# Patient Record
Sex: Male | Born: 1950 | Race: White | Hispanic: No | Smoking: Never smoker
Health system: Southern US, Community
[De-identification: ages and names within clinical notes are randomized; demographics above are authoritative.]

## PROBLEM LIST (undated history)

## (undated) DIAGNOSIS — C4490 Unspecified malignant neoplasm of skin, unspecified: Secondary | ICD-10-CM

## (undated) DIAGNOSIS — H409 Unspecified glaucoma: Secondary | ICD-10-CM

## (undated) DIAGNOSIS — M179 Osteoarthritis of knee, unspecified: Secondary | ICD-10-CM

## (undated) DIAGNOSIS — I10 Essential (primary) hypertension: Secondary | ICD-10-CM

## (undated) DIAGNOSIS — F329 Major depressive disorder, single episode, unspecified: Secondary | ICD-10-CM

## (undated) DIAGNOSIS — G4733 Obstructive sleep apnea (adult) (pediatric): Secondary | ICD-10-CM

## (undated) DIAGNOSIS — L309 Dermatitis, unspecified: Secondary | ICD-10-CM

## (undated) DIAGNOSIS — F32A Depression, unspecified: Secondary | ICD-10-CM

## (undated) DIAGNOSIS — I340 Nonrheumatic mitral (valve) insufficiency: Secondary | ICD-10-CM

## (undated) DIAGNOSIS — C61 Malignant neoplasm of prostate: Secondary | ICD-10-CM

## (undated) DIAGNOSIS — M1710 Unilateral primary osteoarthritis, unspecified knee: Secondary | ICD-10-CM

## (undated) HISTORY — PX: LITHOTRIPSY: SUR834

## (undated) HISTORY — PX: HERNIA REPAIR: SHX51

## (undated) HISTORY — PX: JOINT REPLACEMENT: SHX530

## (undated) HISTORY — PX: COLONOSCOPY: SHX174

## (undated) HISTORY — PX: CATARACT EXTRACTION W/ INTRAOCULAR LENS  IMPLANT, BILATERAL: SHX1307

## (undated) HISTORY — PX: TONSILLECTOMY: SUR1361

## (undated) HISTORY — PX: TUMOR EXCISION: SHX421

---

## 2003-06-07 ENCOUNTER — Ambulatory Visit (HOSPITAL_COMMUNITY): Admission: RE | Admit: 2003-06-07 | Discharge: 2003-06-07 | Payer: Self-pay | Admitting: Internal Medicine

## 2003-08-07 ENCOUNTER — Ambulatory Visit (HOSPITAL_COMMUNITY): Admission: RE | Admit: 2003-08-07 | Discharge: 2003-08-07 | Payer: Self-pay | Admitting: *Deleted

## 2005-04-05 ENCOUNTER — Emergency Department (HOSPITAL_COMMUNITY): Admission: EM | Admit: 2005-04-05 | Discharge: 2005-04-05 | Payer: Self-pay | Admitting: Emergency Medicine

## 2005-09-03 ENCOUNTER — Ambulatory Visit: Payer: Self-pay | Admitting: Pulmonary Disease

## 2005-09-11 ENCOUNTER — Ambulatory Visit (HOSPITAL_BASED_OUTPATIENT_CLINIC_OR_DEPARTMENT_OTHER): Admission: RE | Admit: 2005-09-11 | Discharge: 2005-09-11 | Payer: Self-pay | Admitting: Pulmonary Disease

## 2005-09-14 ENCOUNTER — Ambulatory Visit: Payer: Self-pay | Admitting: Pulmonary Disease

## 2005-09-28 ENCOUNTER — Ambulatory Visit: Payer: Self-pay | Admitting: Pulmonary Disease

## 2005-10-12 ENCOUNTER — Ambulatory Visit (HOSPITAL_BASED_OUTPATIENT_CLINIC_OR_DEPARTMENT_OTHER): Admission: RE | Admit: 2005-10-12 | Discharge: 2005-10-12 | Payer: Self-pay | Admitting: Pulmonary Disease

## 2005-10-21 ENCOUNTER — Ambulatory Visit: Payer: Self-pay | Admitting: Pulmonary Disease

## 2005-10-28 ENCOUNTER — Ambulatory Visit: Payer: Self-pay | Admitting: Pulmonary Disease

## 2005-11-17 ENCOUNTER — Ambulatory Visit: Payer: Self-pay | Admitting: Pulmonary Disease

## 2005-12-03 ENCOUNTER — Ambulatory Visit: Payer: Self-pay | Admitting: Pulmonary Disease

## 2006-01-13 ENCOUNTER — Ambulatory Visit: Payer: Self-pay | Admitting: Pulmonary Disease

## 2006-01-31 ENCOUNTER — Emergency Department (HOSPITAL_COMMUNITY): Admission: EM | Admit: 2006-01-31 | Discharge: 2006-01-31 | Payer: Self-pay | Admitting: Family Medicine

## 2013-08-25 DIAGNOSIS — C4490 Unspecified malignant neoplasm of skin, unspecified: Secondary | ICD-10-CM

## 2013-08-25 HISTORY — PX: SKIN CANCER EXCISION: SHX779

## 2013-08-25 HISTORY — DX: Unspecified malignant neoplasm of skin, unspecified: C44.90

## 2013-09-25 ENCOUNTER — Other Ambulatory Visit: Payer: Self-pay | Admitting: Orthopaedic Surgery

## 2013-09-25 DIAGNOSIS — M25562 Pain in left knee: Secondary | ICD-10-CM

## 2013-09-25 DIAGNOSIS — M1990 Unspecified osteoarthritis, unspecified site: Secondary | ICD-10-CM

## 2013-10-05 ENCOUNTER — Other Ambulatory Visit: Payer: Self-pay

## 2013-10-09 ENCOUNTER — Ambulatory Visit
Admission: RE | Admit: 2013-10-09 | Discharge: 2013-10-09 | Disposition: A | Discharge status: Home / Self Care | Payer: Medicare Other | Source: Ambulatory Visit | Attending: Orthopaedic Surgery | Admitting: Orthopaedic Surgery

## 2013-10-09 DIAGNOSIS — M1990 Unspecified osteoarthritis, unspecified site: Secondary | ICD-10-CM

## 2013-10-09 DIAGNOSIS — M25562 Pain in left knee: Secondary | ICD-10-CM

## 2013-11-15 ENCOUNTER — Other Ambulatory Visit (HOSPITAL_COMMUNITY): Payer: Self-pay | Admitting: Orthopaedic Surgery

## 2013-11-27 ENCOUNTER — Encounter (HOSPITAL_COMMUNITY): Payer: Self-pay | Admitting: Pharmacy Technician

## 2013-11-29 ENCOUNTER — Encounter (HOSPITAL_COMMUNITY): Payer: Self-pay

## 2013-11-29 ENCOUNTER — Encounter (HOSPITAL_COMMUNITY)
Admission: RE | Admit: 2013-11-29 | Discharge: 2013-11-29 | Disposition: A | Discharge status: Home / Self Care | Payer: Medicaid Other | Source: Ambulatory Visit | Attending: Orthopaedic Surgery | Admitting: Orthopaedic Surgery

## 2013-11-29 DIAGNOSIS — Z01818 Encounter for other preprocedural examination: Secondary | ICD-10-CM | POA: Insufficient documentation

## 2013-11-29 DIAGNOSIS — Z01812 Encounter for preprocedural laboratory examination: Secondary | ICD-10-CM | POA: Insufficient documentation

## 2013-11-29 HISTORY — DX: Depression, unspecified: F32.A

## 2013-11-29 HISTORY — DX: Dermatitis, unspecified: L30.9

## 2013-11-29 HISTORY — DX: Major depressive disorder, single episode, unspecified: F32.9

## 2013-11-29 HISTORY — DX: Unspecified glaucoma: H40.9

## 2013-11-29 HISTORY — DX: Unilateral primary osteoarthritis, unspecified knee: M17.10

## 2013-11-29 HISTORY — DX: Osteoarthritis of knee, unspecified: M17.9

## 2013-11-29 HISTORY — DX: Essential (primary) hypertension: I10

## 2013-11-29 HISTORY — DX: Nonrheumatic mitral (valve) insufficiency: I34.0

## 2013-11-29 LAB — TYPE AND SCREEN
ABO/RH(D): O POS
Antibody Screen: NEGATIVE

## 2013-11-29 LAB — COMPREHENSIVE METABOLIC PANEL
ALBUMIN: 4 g/dL (ref 3.5–5.2)
ALK PHOS: 98 U/L (ref 39–117)
ALT: 25 U/L (ref 0–53)
AST: 16 U/L (ref 0–37)
BILIRUBIN TOTAL: 0.5 mg/dL (ref 0.3–1.2)
BUN: 15 mg/dL (ref 6–23)
CHLORIDE: 102 meq/L (ref 96–112)
CO2: 28 mEq/L (ref 19–32)
Calcium: 9.4 mg/dL (ref 8.4–10.5)
Creatinine, Ser: 0.85 mg/dL (ref 0.50–1.35)
GFR calc Af Amer: >90 mL/min (ref >90)
GFR calc non Af Amer: >90 mL/min (ref >90)
Glucose, Bld: 109 mg/dL — ABNORMAL HIGH (ref 70–99)
POTASSIUM: 4.6 meq/L (ref 3.7–5.3)
Sodium: 143 mEq/L (ref 137–147)
Total Protein: 7.8 g/dL (ref 6.0–8.3)

## 2013-11-29 LAB — URINALYSIS, ROUTINE W REFLEX MICROSCOPIC
Bilirubin Urine: NEGATIVE
Glucose, UA: NEGATIVE mg/dL
Hgb urine dipstick: NEGATIVE
Ketones, ur: NEGATIVE mg/dL
LEUKOCYTES UA: NEGATIVE
NITRITE: NEGATIVE
PH: 5 (ref 5.0–8.0)
Protein, ur: NEGATIVE mg/dL
SPECIFIC GRAVITY, URINE: 1.014 (ref 1.005–1.030)
Urobilinogen, UA: 0.2 mg/dL (ref 0.0–1.0)

## 2013-11-29 LAB — SEDIMENTATION RATE: Sed Rate: 8 mm/hr (ref 0–16)

## 2013-11-29 LAB — C-REACTIVE PROTEIN: CRP: 0.6 mg/dL — ABNORMAL HIGH (ref <0.60)

## 2013-11-29 LAB — CBC WITH DIFFERENTIAL/PLATELET
BASOS ABS: 0.1 10*3/uL (ref 0.0–0.1)
Basophils Relative: 0 % (ref 0–1)
Eosinophils Absolute: 0.3 10*3/uL (ref 0.0–0.7)
Eosinophils Relative: 3 % (ref 0–5)
HCT: 45.5 % (ref 39.0–52.0)
HEMOGLOBIN: 15.5 g/dL (ref 13.0–17.0)
LYMPHS PCT: 22 % (ref 12–46)
Lymphs Abs: 2.5 10*3/uL (ref 0.7–4.0)
MCH: 30.6 pg (ref 26.0–34.0)
MCHC: 34.1 g/dL (ref 30.0–36.0)
MCV: 89.7 fL (ref 78.0–100.0)
MONOS PCT: 6 % (ref 3–12)
Monocytes Absolute: 0.7 10*3/uL (ref 0.1–1.0)
NEUTROS ABS: 7.8 10*3/uL — AB (ref 1.7–7.7)
Neutrophils Relative %: 69 % (ref 43–77)
Platelets: 280 10*3/uL (ref 150–400)
RBC: 5.07 MIL/uL (ref 4.22–5.81)
RDW: 13.5 % (ref 11.5–15.5)
WBC: 11.3 10*3/uL — AB (ref 4.0–10.5)

## 2013-11-29 LAB — SURGICAL PCR SCREEN
MRSA, PCR: NEGATIVE
Staphylococcus aureus: POSITIVE — AB

## 2013-11-29 LAB — PROTIME-INR
INR: 0.98 (ref 0.00–1.49)
Prothrombin Time: 12.8 seconds (ref 11.6–15.2)

## 2013-11-29 LAB — APTT: APTT: 33 s (ref 24–37)

## 2013-11-29 LAB — PREALBUMIN: Prealbumin: 25 mg/dL (ref 17.0–34.0)

## 2013-11-29 LAB — ABO/RH: ABO/RH(D): O POS

## 2013-11-29 NOTE — Pre-Procedure Instructions (Signed)
BENJAMINE STROUT  11/29/2013   Your procedure is scheduled on:  Wednesday, December 05, 2013 at 12:30 PM  Report to [place] [facility] (use Main Entrance "A'') at 10:30 AM.  Call this number if you have problems the morning of surgery: [phone]   Remember:   Do not eat food or drink liquids after midnight.   Take these medicines the morning of surgery with A SIP OF WATER: amLODipine (NORVASC) 10 MG tablet, carvedilol (COREG) 25 MG tablet, traMADol (ULTRAM) 50 MG tablet, Vilazodone HCl (VIIBRYD) 40 MG TABS Stop taking Aspirin vitamins and herbal medications. Do not take any NSAIDs ie: Ibuprofen, Advil, Naproxen or any medication containing Aspirin.   Do not wear jewelry, make-up or nail polish.  Do not wear lotions, powders, or perfumes. You may wear deodorant.  Do not shave 48 hours prior to surgery. Men may shave face and neck.  Do not bring valuables to the hospital.  [facility] is not responsible for any belongings or valuables.               Contacts, dentures or bridgework may not be worn into surgery.  Leave suitcase in the car. After surgery it may be brought to your room.  For patients admitted to the hospital, discharge time is determined by your treatment team.               Patients discharged the day of surgery will not be allowed to drive home.  Name and phone number of your driver:   Special Instructions:  Special Instructions:Special Instructions: [facility] - Preparing for Surgery  Before surgery, you can play an important role.  Because skin is not sterile, your skin needs to be as free of germs as possible.  You can reduce the number of germs on you skin by washing with CHG (chlorahexidine gluconate) soap before surgery.  CHG is an antiseptic cleaner which kills germs and bonds with the skin to continue killing germs even after washing.  Please DO NOT use if you have an allergy to CHG or antibacterial soaps.  If your skin becomes reddened/irritated stop using the  CHG and inform your nurse when you arrive at Short Stay.  Do not shave (including legs and underarms) for at least 48 hours prior to the first CHG shower.  You may shave your face.  Please follow these instructions carefully:   1.  Shower with CHG Soap the night before surgery and the morning of Surgery.  2.  If you choose to wash your hair, wash your hair first as usual with your normal shampoo.  3.  After you shampoo, rinse your hair and body thoroughly to remove the Shampoo.  4.  Use CHG as you would any other liquid soap.  You can apply chg directly  to the skin and wash gently with scrungie or a clean washcloth.  5.  Apply the CHG Soap to your body ONLY FROM THE NECK DOWN.  Do not use on open wounds or open sores.  Avoid contact with your eyes, ears, mouth and genitals (private parts).  Wash genitals (private parts) with your normal soap.  6.  Wash thoroughly, paying special attention to the area where your surgery will be performed.  7.  Thoroughly rinse your body with warm water from the neck down.  8.  DO NOT shower/wash with your normal soap after using and rinsing off the CHG Soap.  9.  Pat yourself dry with a clean towel.  10.  Wear clean pajamas.            11.  Place clean sheets on your bed the night of your first shower and do not sleep with pets.  Day of Surgery  Do not apply any lotions/deodorants the morning of surgery.  Please wear clean clothes to the hospital/surgery center.   Please read over the following fact sheets that you were given: Pain Booklet, Coughing and Deep Breathing, Blood Transfusion Information, Total Joint Packet, MRSA Information and Surgical Site Infection Prevention

## 2013-11-30 ENCOUNTER — Other Ambulatory Visit (HOSPITAL_COMMUNITY): Payer: Self-pay | Admitting: Orthopaedic Surgery

## 2013-11-30 LAB — URINE CULTURE
COLONY COUNT: NO GROWTH
Culture: NO GROWTH

## 2013-11-30 NOTE — Progress Notes (Addendum)
Anesthesia Chart Review:  Patient is a 63 year old male scheduled for left TKA on 12/05/13 by Dr. Erlinda Hong. Anesthesia type is posted for spinal.  History includes morbid obesity, HTN, OSA, "slight" mitral regurgitation, glaucoma, non-smoker, anxiety, depression, BCC of the back s/p excision '14, osteoarthritis, cataract extraction.  IM/Pulmonologist is Dr. Gwenevere Ghazi who medically cleared patient for this procedure; however, per PAT RN notes, he is getting a stress test today.  Additional records from [facility] are still pending.  EKG on 08/20/13 ([facility]) showed NSR.  Preoperative labs noted.  Urine culture is still pending.  Follow-up early next week regarding CXR, stress test, and other records.  [facility] Short Stay Center/Anesthesiology Phone [phone] 11/30/2013 3:32 PM  Addendum: 12/04/2013 11:10 AM Reviewed records from [facility].  He was seen on 11/30/13.  It appears echo (not stress), ABIs, and carotid duplex were done (see below).  Based on results, no further cardiac testing was recommended preoperatively by Dr. Lawson Radar.  By notes, recent carotid study showed only mild ICA stenosis/plaquing and LE ABI/duplex studies were normal.  Echo on 11/30/13 showed overall LV systolic function is normal, EF 32-44%, diastolic filling pattern indicates impaired relaxation, mildly dilated LA, normal RV size and function.     No CXR report received as of yet.  Urine culture showed no growth.

## 2013-12-04 ENCOUNTER — Other Ambulatory Visit (HOSPITAL_COMMUNITY): Payer: Self-pay | Admitting: Orthopaedic Surgery

## 2013-12-04 LAB — VITAMIN D 1,25 DIHYDROXY
Vitamin D 1, 25 (OH)2 Total: 39 pg/mL (ref 18–72)
Vitamin D2 1, 25 (OH)2: <8 pg/mL
Vitamin D3 1, 25 (OH)2: 39 pg/mL

## 2013-12-04 MED ORDER — DEXTROSE 5 % IV SOLN
3.0000 g | INTRAVENOUS | Status: AC
  Administered 2013-12-05: 3 g via INTRAVENOUS
  Filled 2013-12-04: qty 3000

## 2013-12-04 MED ORDER — TRANEXAMIC ACID 100 MG/ML IV SOLN
1000.0000 mg | INTRAVENOUS | Status: AC
  Administered 2013-12-05: 1000 mg via INTRAVENOUS
  Filled 2013-12-04: qty 10

## 2013-12-04 MED ORDER — VANCOMYCIN HCL 10 G IV SOLR
1500.0000 mg | INTRAVENOUS | Status: AC
  Administered 2013-12-05: 1500 mg via INTRAVENOUS
  Filled 2013-12-04: qty 1500

## 2013-12-04 MED ORDER — BUPIVACAINE LIPOSOME 1.3 % IJ SUSP
20.0000 mL | Freq: Once | INTRAMUSCULAR | Status: DC
  Filled 2013-12-04: qty 20

## 2013-12-04 NOTE — Progress Notes (Signed)
Spoke with [facility] and requested pts echocardiogram that was done on 11/30/13.  Office stated will fax now.

## 2013-12-05 ENCOUNTER — Inpatient Hospital Stay (HOSPITAL_COMMUNITY): Payer: Medicaid Other | Admitting: Anesthesiology

## 2013-12-05 ENCOUNTER — Encounter (HOSPITAL_COMMUNITY): Payer: Self-pay | Admitting: *Deleted

## 2013-12-05 ENCOUNTER — Encounter (HOSPITAL_COMMUNITY): Payer: Medicaid Other | Admitting: Vascular Surgery

## 2013-12-05 ENCOUNTER — Inpatient Hospital Stay (HOSPITAL_COMMUNITY): Payer: Medicaid Other

## 2013-12-05 ENCOUNTER — Encounter (HOSPITAL_COMMUNITY)
Admission: RE | Disposition: A | Discharge status: Rehabilitation Facility | Payer: Self-pay | Source: Ambulatory Visit | Attending: Orthopaedic Surgery

## 2013-12-05 ENCOUNTER — Inpatient Hospital Stay (HOSPITAL_COMMUNITY)
Admission: RE | Admit: 2013-12-05 | Discharge: 2013-12-11 | DRG: 470 | Disposition: A | Discharge status: Rehabilitation Facility | Payer: Medicaid Other | Source: Ambulatory Visit | Attending: Orthopaedic Surgery | Admitting: Orthopaedic Surgery

## 2013-12-05 DIAGNOSIS — Z96659 Presence of unspecified artificial knee joint: Secondary | ICD-10-CM

## 2013-12-05 DIAGNOSIS — F329 Major depressive disorder, single episode, unspecified: Secondary | ICD-10-CM | POA: Diagnosis present

## 2013-12-05 DIAGNOSIS — M545 Low back pain, unspecified: Secondary | ICD-10-CM | POA: Diagnosis present

## 2013-12-05 DIAGNOSIS — G4733 Obstructive sleep apnea (adult) (pediatric): Secondary | ICD-10-CM | POA: Diagnosis present

## 2013-12-05 DIAGNOSIS — I10 Essential (primary) hypertension: Secondary | ICD-10-CM | POA: Diagnosis present

## 2013-12-05 DIAGNOSIS — M1710 Unilateral primary osteoarthritis, unspecified knee: Principal | ICD-10-CM | POA: Diagnosis present

## 2013-12-05 DIAGNOSIS — G8929 Other chronic pain: Secondary | ICD-10-CM | POA: Diagnosis present

## 2013-12-05 DIAGNOSIS — M1712 Unilateral primary osteoarthritis, left knee: Secondary | ICD-10-CM | POA: Diagnosis present

## 2013-12-05 DIAGNOSIS — F410 Panic disorder [episodic paroxysmal anxiety] without agoraphobia: Secondary | ICD-10-CM | POA: Diagnosis present

## 2013-12-05 DIAGNOSIS — N390 Urinary tract infection, site not specified: Secondary | ICD-10-CM | POA: Diagnosis present

## 2013-12-05 DIAGNOSIS — G4700 Insomnia, unspecified: Secondary | ICD-10-CM | POA: Diagnosis present

## 2013-12-05 DIAGNOSIS — F3289 Other specified depressive episodes: Secondary | ICD-10-CM | POA: Diagnosis present

## 2013-12-05 HISTORY — PX: TOTAL KNEE ARTHROPLASTY: SHX125

## 2013-12-05 LAB — URINALYSIS, ROUTINE W REFLEX MICROSCOPIC
Glucose, UA: NEGATIVE mg/dL
HGB URINE DIPSTICK: NEGATIVE
Ketones, ur: 15 mg/dL — AB
Nitrite: POSITIVE — AB
PH: 6 (ref 5.0–8.0)
Protein, ur: NEGATIVE mg/dL
SPECIFIC GRAVITY, URINE: 1.029 (ref 1.005–1.030)
Urobilinogen, UA: 1 mg/dL (ref 0.0–1.0)

## 2013-12-05 LAB — URINE MICROSCOPIC-ADD ON

## 2013-12-05 SURGERY — ARTHROPLASTY, KNEE, TOTAL
Anesthesia: General | Laterality: Left

## 2013-12-05 MED ORDER — POLYETHYLENE GLYCOL 3350 17 G PO PACK
17.0000 g | PACK | Freq: Every day | ORAL | Status: DC | PRN
  Filled 2013-12-05: qty 1

## 2013-12-05 MED ORDER — TRAZODONE HCL 100 MG PO TABS
100.0000 mg | ORAL_TABLET | Freq: Every day | ORAL | Status: DC
  Administered 2013-12-05 – 2013-12-10 (×6): 100 mg via ORAL
  Filled 2013-12-05 (×7): qty 1

## 2013-12-05 MED ORDER — SUCCINYLCHOLINE CHLORIDE 20 MG/ML IJ SOLN
INTRAMUSCULAR | Status: DC | PRN
  Administered 2013-12-05: 160 mg via INTRAVENOUS

## 2013-12-05 MED ORDER — PHENYLEPHRINE HCL 10 MG/ML IJ SOLN
10.0000 mg | INTRAVENOUS | Status: DC | PRN
  Administered 2013-12-05: 50 ug/min via INTRAVENOUS

## 2013-12-05 MED ORDER — FENTANYL CITRATE 0.05 MG/ML IJ SOLN
INTRAMUSCULAR | Status: AC
  Filled 2013-12-05: qty 5

## 2013-12-05 MED ORDER — SODIUM CHLORIDE 0.9 % IV SOLN
INTRAVENOUS | Status: DC
  Administered 2013-12-06: 08:00:00 via INTRAVENOUS

## 2013-12-05 MED ORDER — LIDOCAINE HCL (CARDIAC) 20 MG/ML IV SOLN
INTRAVENOUS | Status: DC | PRN
  Administered 2013-12-05: 100 mg via INTRAVENOUS

## 2013-12-05 MED ORDER — KETOROLAC TROMETHAMINE 30 MG/ML IJ SOLN
30.0000 mg | Freq: Once | INTRAMUSCULAR | Status: AC
  Administered 2013-12-05: 30 mg via INTRAVENOUS

## 2013-12-05 MED ORDER — 0.9 % SODIUM CHLORIDE (POUR BTL) OPTIME
TOPICAL | Status: DC | PRN
  Administered 2013-12-05: 500 mL

## 2013-12-05 MED ORDER — KETOROLAC TROMETHAMINE 15 MG/ML IJ SOLN
30.0000 mg | Freq: Four times a day (QID) | INTRAMUSCULAR | Status: AC | PRN
  Administered 2013-12-06: 30 mg via INTRAVENOUS
  Filled 2013-12-05: qty 2

## 2013-12-05 MED ORDER — CHLORHEXIDINE GLUCONATE 4 % EX LIQD
60.0000 mL | Freq: Once | CUTANEOUS | Status: DC
  Filled 2013-12-05: qty 60

## 2013-12-05 MED ORDER — MIDAZOLAM HCL 2 MG/2ML IJ SOLN
INTRAMUSCULAR | Status: AC
  Filled 2013-12-05: qty 2

## 2013-12-05 MED ORDER — SENNA 8.6 MG PO TABS
1.0000 | ORAL_TABLET | Freq: Two times a day (BID) | ORAL | Status: DC
  Administered 2013-12-05 – 2013-12-09 (×9): 8.6 mg via ORAL
  Filled 2013-12-05 (×13): qty 1

## 2013-12-05 MED ORDER — SODIUM CHLORIDE 0.9 % IV SOLN
INTRAVENOUS | Status: DC | PRN
  Administered 2013-12-05: 13:00:00 via INTRAVENOUS

## 2013-12-05 MED ORDER — MENTHOL 3 MG MT LOZG: 1.0000 | LOZENGE | OROMUCOSAL | Status: DC | PRN

## 2013-12-05 MED ORDER — METHOCARBAMOL 100 MG/ML IJ SOLN: 500.0000 mg | Freq: Four times a day (QID) | INTRAVENOUS | Status: DC | PRN

## 2013-12-05 MED ORDER — AMLODIPINE BESYLATE 10 MG PO TABS
10.0000 mg | ORAL_TABLET | Freq: Every day | ORAL | Status: DC
  Administered 2013-12-06 – 2013-12-11 (×6): 10 mg via ORAL
  Filled 2013-12-05 (×7): qty 1

## 2013-12-05 MED ORDER — SORBITOL 70 % SOLN: 30.0000 mL | Freq: Every day | Status: DC | PRN

## 2013-12-05 MED ORDER — NEOSTIGMINE METHYLSULFATE 1 MG/ML IJ SOLN
INTRAMUSCULAR | Status: AC
  Filled 2013-12-05: qty 20

## 2013-12-05 MED ORDER — KETOROLAC TROMETHAMINE 30 MG/ML IJ SOLN
INTRAMUSCULAR | Status: AC
  Administered 2013-12-05: 30 mg via INTRAVENOUS
  Filled 2013-12-05: qty 1

## 2013-12-05 MED ORDER — ACETAMINOPHEN 500 MG PO TABS
1000.0000 mg | ORAL_TABLET | Freq: Four times a day (QID) | ORAL | Status: AC
  Administered 2013-12-05 – 2013-12-06 (×4): 1000 mg via ORAL
  Filled 2013-12-05 (×4): qty 2

## 2013-12-05 MED ORDER — PROPOFOL 10 MG/ML IV BOLUS
INTRAVENOUS | Status: AC
  Filled 2013-12-05: qty 20

## 2013-12-05 MED ORDER — METHOCARBAMOL 500 MG PO TABS
ORAL_TABLET | ORAL | Status: AC
  Filled 2013-12-05: qty 1

## 2013-12-05 MED ORDER — TRANEXAMIC ACID 100 MG/ML IV SOLN
1000.0000 mg | INTRAVENOUS | Status: AC
  Administered 2013-12-05: 1000 mg via INTRAVENOUS
  Filled 2013-12-05: qty 10

## 2013-12-05 MED ORDER — LIDOCAINE HCL (CARDIAC) 20 MG/ML IV SOLN
INTRAVENOUS | Status: AC
Start: 2013-12-05 — End: 2013-12-05
  Filled 2013-12-05: qty 5

## 2013-12-05 MED ORDER — ONDANSETRON HCL 4 MG/2ML IJ SOLN: 4.0000 mg | Freq: Four times a day (QID) | INTRAMUSCULAR | Status: DC | PRN

## 2013-12-05 MED ORDER — METOCLOPRAMIDE HCL 10 MG PO TABS: 5.0000 mg | ORAL_TABLET | Freq: Three times a day (TID) | ORAL | Status: DC | PRN

## 2013-12-05 MED ORDER — HYDROMORPHONE HCL PF 1 MG/ML IJ SOLN
INTRAMUSCULAR | Status: AC
  Administered 2013-12-05: 0.5 mg via INTRAVENOUS
  Filled 2013-12-05: qty 1

## 2013-12-05 MED ORDER — MORPHINE SULFATE 2 MG/ML IJ SOLN
2.0000 mg | INTRAMUSCULAR | Status: DC | PRN
  Administered 2013-12-05 – 2013-12-06 (×4): 2 mg via INTRAVENOUS
  Filled 2013-12-05 (×4): qty 1

## 2013-12-05 MED ORDER — SODIUM CHLORIDE 0.9 % IR SOLN
Status: DC | PRN
  Administered 2013-12-05: 3000 mL

## 2013-12-05 MED ORDER — CARVEDILOL 25 MG PO TABS
25.0000 mg | ORAL_TABLET | Freq: Two times a day (BID) | ORAL | Status: DC
  Administered 2013-12-06 – 2013-12-11 (×11): 25 mg via ORAL
  Filled 2013-12-05 (×13): qty 1

## 2013-12-05 MED ORDER — ACETAMINOPHEN 325 MG PO TABS: 650.0000 mg | ORAL_TABLET | Freq: Four times a day (QID) | ORAL | Status: DC | PRN

## 2013-12-05 MED ORDER — ONDANSETRON HCL 4 MG PO TABS
4.0000 mg | ORAL_TABLET | Freq: Four times a day (QID) | ORAL | Status: DC | PRN
  Administered 2013-12-09 – 2013-12-10 (×2): 4 mg via ORAL
  Filled 2013-12-05 (×2): qty 1

## 2013-12-05 MED ORDER — SODIUM CHLORIDE 0.9 % IJ SOLN
INTRAMUSCULAR | Status: AC
  Filled 2013-12-05: qty 10

## 2013-12-05 MED ORDER — DIPHENHYDRAMINE HCL 12.5 MG/5ML PO ELIX: 25.0000 mg | ORAL_SOLUTION | ORAL | Status: DC | PRN

## 2013-12-05 MED ORDER — METHOCARBAMOL 500 MG PO TABS
500.0000 mg | ORAL_TABLET | Freq: Four times a day (QID) | ORAL | Status: DC | PRN
  Administered 2013-12-05 – 2013-12-09 (×11): 500 mg via ORAL
  Filled 2013-12-05 (×11): qty 1

## 2013-12-05 MED ORDER — GLYCOPYRROLATE 0.2 MG/ML IJ SOLN
INTRAMUSCULAR | Status: AC
  Filled 2013-12-05: qty 2

## 2013-12-05 MED ORDER — ROCURONIUM BROMIDE 100 MG/10ML IV SOLN
INTRAVENOUS | Status: DC | PRN
  Administered 2013-12-05: 50 mg via INTRAVENOUS

## 2013-12-05 MED ORDER — FENTANYL CITRATE 0.05 MG/ML IJ SOLN
INTRAMUSCULAR | Status: DC | PRN
  Administered 2013-12-05: 50 ug via INTRAVENOUS
  Administered 2013-12-05: 25 ug via INTRAVENOUS
  Administered 2013-12-05: 50 ug via INTRAVENOUS
  Administered 2013-12-05: 100 ug via INTRAVENOUS
  Administered 2013-12-05: 25 ug via INTRAVENOUS
  Administered 2013-12-05: 100 ug via INTRAVENOUS

## 2013-12-05 MED ORDER — OXYCODONE HCL 5 MG PO TABS
ORAL_TABLET | ORAL | Status: AC
  Administered 2013-12-05: 5 mg via ORAL
  Filled 2013-12-05: qty 1

## 2013-12-05 MED ORDER — LIDOCAINE HCL (CARDIAC) 20 MG/ML IV SOLN
INTRAVENOUS | Status: AC
  Filled 2013-12-05: qty 5

## 2013-12-05 MED ORDER — LACTATED RINGERS IV SOLN
INTRAVENOUS | Status: DC
  Administered 2013-12-05: 11:00:00 via INTRAVENOUS

## 2013-12-05 MED ORDER — ASPIRIN EC 325 MG PO TBEC
325.0000 mg | DELAYED_RELEASE_TABLET | Freq: Two times a day (BID) | ORAL | Status: DC
  Administered 2013-12-05 – 2013-12-11 (×12): 325 mg via ORAL
  Filled 2013-12-05 (×14): qty 1

## 2013-12-05 MED ORDER — MIDAZOLAM HCL 5 MG/5ML IJ SOLN
INTRAMUSCULAR | Status: DC | PRN
  Administered 2013-12-05: 2 mg via INTRAVENOUS

## 2013-12-05 MED ORDER — VILAZODONE HCL 40 MG PO TABS
40.0000 mg | ORAL_TABLET | Freq: Every day | ORAL | Status: DC
  Administered 2013-12-06 – 2013-12-11 (×6): 40 mg via ORAL
  Filled 2013-12-05 (×7): qty 1

## 2013-12-05 MED ORDER — SODIUM CHLORIDE 0.9 % IJ SOLN
INTRAMUSCULAR | Status: DC | PRN
  Administered 2013-12-05: 15:00:00

## 2013-12-05 MED ORDER — METOCLOPRAMIDE HCL 5 MG/ML IJ SOLN
5.0000 mg | Freq: Three times a day (TID) | INTRAMUSCULAR | Status: DC | PRN
  Administered 2013-12-10: 10 mg via INTRAVENOUS
  Filled 2013-12-05: qty 2

## 2013-12-05 MED ORDER — VANCOMYCIN HCL 10 G IV SOLR
1250.0000 mg | Freq: Two times a day (BID) | INTRAVENOUS | Status: AC
  Administered 2013-12-05 – 2013-12-06 (×2): 1250 mg via INTRAVENOUS
  Filled 2013-12-05 (×2): qty 1250

## 2013-12-05 MED ORDER — LISINOPRIL 40 MG PO TABS
40.0000 mg | ORAL_TABLET | Freq: Every day | ORAL | Status: DC
  Administered 2013-12-06 – 2013-12-11 (×6): 40 mg via ORAL
  Filled 2013-12-05 (×7): qty 1

## 2013-12-05 MED ORDER — FENTANYL CITRATE 0.05 MG/ML IJ SOLN
INTRAMUSCULAR | Status: DC
Start: 2013-12-05 — End: 2013-12-05
  Filled 2013-12-05: qty 2

## 2013-12-05 MED ORDER — TRAMADOL HCL 50 MG PO TABS
50.0000 mg | ORAL_TABLET | Freq: Four times a day (QID) | ORAL | Status: DC
  Administered 2013-12-05 – 2013-12-11 (×24): 50 mg via ORAL
  Filled 2013-12-05 (×26): qty 1

## 2013-12-05 MED ORDER — FUROSEMIDE 40 MG PO TABS
40.0000 mg | ORAL_TABLET | Freq: Every day | ORAL | Status: DC
  Administered 2013-12-06 – 2013-12-11 (×6): 40 mg via ORAL
  Filled 2013-12-05 (×7): qty 1

## 2013-12-05 MED ORDER — 0.9 % SODIUM CHLORIDE (POUR BTL) OPTIME
TOPICAL | Status: DC | PRN
  Administered 2013-12-05: 1000 mL

## 2013-12-05 MED ORDER — PHENYLEPHRINE HCL 10 MG/ML IJ SOLN
INTRAMUSCULAR | Status: DC | PRN
  Administered 2013-12-05: 80 ug via INTRAVENOUS
  Administered 2013-12-05: 40 ug via INTRAVENOUS
  Administered 2013-12-05: 80 ug via INTRAVENOUS
  Administered 2013-12-05: 40 ug via INTRAVENOUS
  Administered 2013-12-05 (×2): 80 ug via INTRAVENOUS

## 2013-12-05 MED ORDER — PHENYLEPHRINE 40 MCG/ML (10ML) SYRINGE FOR IV PUSH (FOR BLOOD PRESSURE SUPPORT)
PREFILLED_SYRINGE | INTRAVENOUS | Status: AC
  Filled 2013-12-05: qty 10

## 2013-12-05 MED ORDER — EPHEDRINE SULFATE 50 MG/ML IJ SOLN
INTRAMUSCULAR | Status: AC
Start: 2013-12-05 — End: 2013-12-05
  Filled 2013-12-05: qty 1

## 2013-12-05 MED ORDER — ONDANSETRON HCL 4 MG/2ML IJ SOLN
INTRAMUSCULAR | Status: DC | PRN
  Administered 2013-12-05: 4 mg via INTRAVENOUS

## 2013-12-05 MED ORDER — GLYCOPYRROLATE 0.2 MG/ML IJ SOLN
INTRAMUSCULAR | Status: DC | PRN
  Administered 2013-12-05: 0.4 mg via INTRAVENOUS

## 2013-12-05 MED ORDER — OXYCODONE HCL 5 MG/5ML PO SOLN: 5.0000 mg | Freq: Once | ORAL | Status: AC | PRN

## 2013-12-05 MED ORDER — HYDROMORPHONE HCL PF 1 MG/ML IJ SOLN
0.2500 mg | INTRAMUSCULAR | Status: DC | PRN
  Administered 2013-12-05 (×2): 0.5 mg via INTRAVENOUS

## 2013-12-05 MED ORDER — MAGNESIUM CITRATE PO SOLN: 1.0000 | Freq: Once | ORAL | Status: AC | PRN

## 2013-12-05 MED ORDER — ONDANSETRON HCL 4 MG/2ML IJ SOLN
INTRAMUSCULAR | Status: AC
  Filled 2013-12-05: qty 2

## 2013-12-05 MED ORDER — OXYCODONE HCL 5 MG PO TABS
5.0000 mg | ORAL_TABLET | ORAL | Status: DC | PRN
  Administered 2013-12-06 – 2013-12-11 (×19): 10 mg via ORAL
  Filled 2013-12-05 (×20): qty 2

## 2013-12-05 MED ORDER — ALUM & MAG HYDROXIDE-SIMETH 200-200-20 MG/5ML PO SUSP: 30.0000 mL | ORAL | Status: DC | PRN

## 2013-12-05 MED ORDER — DEXTROSE 5 % IV SOLN
1.0000 g | INTRAVENOUS | Status: DC
  Administered 2013-12-05 – 2013-12-10 (×6): 1 g via INTRAVENOUS
  Filled 2013-12-05 (×7): qty 10

## 2013-12-05 MED ORDER — DEXTROSE 5 % IV SOLN
1.0000 g | Freq: Once | INTRAVENOUS | Status: AC
  Administered 2013-12-05: 1 g via INTRAVENOUS
  Filled 2013-12-05: qty 10

## 2013-12-05 MED ORDER — ROPIVACAINE HCL 5 MG/ML IJ SOLN
INTRAMUSCULAR | Status: DC | PRN
  Administered 2013-12-05: 20 mL via PERINEURAL

## 2013-12-05 MED ORDER — PROPOFOL 10 MG/ML IV BOLUS
INTRAVENOUS | Status: DC | PRN
  Administered 2013-12-05: 180 mg via INTRAVENOUS

## 2013-12-05 MED ORDER — LACTATED RINGERS IV SOLN
INTRAVENOUS | Status: DC | PRN
  Administered 2013-12-05 (×2): via INTRAVENOUS

## 2013-12-05 MED ORDER — OXYCODONE HCL ER 15 MG PO T12A
15.0000 mg | EXTENDED_RELEASE_TABLET | Freq: Two times a day (BID) | ORAL | Status: DC
  Administered 2013-12-05 – 2013-12-11 (×12): 15 mg via ORAL
  Filled 2013-12-05 (×12): qty 1

## 2013-12-05 MED ORDER — PHENOL 1.4 % MT LIQD: 1.0000 | OROMUCOSAL | Status: DC | PRN

## 2013-12-05 MED ORDER — NEOSTIGMINE METHYLSULFATE 1 MG/ML IJ SOLN
INTRAMUSCULAR | Status: DC | PRN
  Administered 2013-12-05: 3 mg via INTRAVENOUS

## 2013-12-05 MED ORDER — SUCCINYLCHOLINE CHLORIDE 20 MG/ML IJ SOLN
INTRAMUSCULAR | Status: AC
Start: 2013-12-05 — End: 2013-12-05
  Filled 2013-12-05: qty 1

## 2013-12-05 MED ORDER — OXYCODONE HCL 5 MG PO TABS
5.0000 mg | ORAL_TABLET | Freq: Once | ORAL | Status: AC | PRN
  Administered 2013-12-05: 5 mg via ORAL

## 2013-12-05 MED ORDER — ACETAMINOPHEN 650 MG RE SUPP: 650.0000 mg | Freq: Four times a day (QID) | RECTAL | Status: DC | PRN

## 2013-12-05 SURGICAL SUPPLY — 78 items
BANDAGE ELASTIC 6 VELCRO ST LF (GAUZE/BANDAGES/DRESSINGS) ×6 IMPLANT
BANDAGE ESMARK 6X9 LF (GAUZE/BANDAGES/DRESSINGS) ×1 IMPLANT
BLADE SAG 18X100X1.27 (BLADE) ×3 IMPLANT
BLADE SAW SGTL 13.0X1.19X90.0M (BLADE) ×3 IMPLANT
BNDG ESMARK 6X9 LF (GAUZE/BANDAGES/DRESSINGS) ×3
BOWL SMART MIX CTS (DISPOSABLE) ×3 IMPLANT
CAP KNEE TOTAL OXINIUM W/POLY ×3 IMPLANT
CEMENT BONE SIMPLEX SPEEDSET (Cement) ×6 IMPLANT
CLOSURE WOUND 1/2 X4 (GAUZE/BANDAGES/DRESSINGS) ×2
CLOTH BEACON ORANGE TIMEOUT ST (SAFETY) ×3 IMPLANT
COVER SURGICAL LIGHT HANDLE (MISCELLANEOUS) ×3 IMPLANT
CUFF TOURNIQUET SINGLE 34IN LL (TOURNIQUET CUFF) IMPLANT
CUFF TOURNIQUET SINGLE 44IN (TOURNIQUET CUFF) ×3 IMPLANT
DERMABOND ADVANCED (GAUZE/BANDAGES/DRESSINGS) ×2
DERMABOND ADVANCED .7 DNX12 (GAUZE/BANDAGES/DRESSINGS) ×1 IMPLANT
DRAPE EXTREMITY T 121X128X90 (DRAPE) ×3 IMPLANT
DRAPE INCISE IOBAN 66X45 STRL (DRAPES) ×3 IMPLANT
DRAPE ORTHO SPLIT 77X108 STRL (DRAPES) ×4
DRAPE POUCH INSTRU U-SHP 10X18 (DRAPES) ×3 IMPLANT
DRAPE PROXIMA HALF (DRAPES) ×3 IMPLANT
DRAPE SURG 17X11 SM STRL (DRAPES) ×6 IMPLANT
DRAPE SURG ORHT 6 SPLT 77X108 (DRAPES) ×2 IMPLANT
DRAPE U-SHAPE 47X51 STRL (DRAPES) ×3 IMPLANT
DRSG AQUACEL AG ADV 3.5X14 (GAUZE/BANDAGES/DRESSINGS) ×3 IMPLANT
DRSG PAD ABDOMINAL 8X10 ST (GAUZE/BANDAGES/DRESSINGS) ×3 IMPLANT
DRSG TEGADERM 4X4.75 (GAUZE/BANDAGES/DRESSINGS) ×3 IMPLANT
DURAPREP 26ML APPLICATOR (WOUND CARE) ×6 IMPLANT
ELECT REM PT RETURN 9FT ADLT (ELECTROSURGICAL) ×3
ELECTRODE REM PT RTRN 9FT ADLT (ELECTROSURGICAL) ×1 IMPLANT
EVACUATOR 1/8 PVC DRAIN (DRAIN) IMPLANT
FACESHIELD LNG OPTICON STERILE (SAFETY) ×9 IMPLANT
GAUZE SPONGE 2X2 8PLY STRL LF (GAUZE/BANDAGES/DRESSINGS) ×1 IMPLANT
GAUZE XEROFORM 5X9 LF (GAUZE/BANDAGES/DRESSINGS) ×3 IMPLANT
GLOVE BIO SURGEON STRL SZ8 (GLOVE) ×3 IMPLANT
GLOVE BIOGEL PI IND STRL 7.5 (GLOVE) ×1 IMPLANT
GLOVE BIOGEL PI INDICATOR 7.5 (GLOVE) ×2
GLOVE SURG SS PI 6.5 STRL IVOR (GLOVE) ×3 IMPLANT
GLOVE SURG SS PI 7.5 STRL IVOR (GLOVE) ×15 IMPLANT
GOWN PREVENTION PLUS LG XLONG (DISPOSABLE) IMPLANT
GOWN STRL REUS W/ TWL LRG LVL3 (GOWN DISPOSABLE) ×2 IMPLANT
GOWN STRL REUS W/ TWL XL LVL3 (GOWN DISPOSABLE) ×4 IMPLANT
GOWN STRL REUS W/TWL LRG LVL3 (GOWN DISPOSABLE) ×4
GOWN STRL REUS W/TWL XL LVL3 (GOWN DISPOSABLE) ×8
HANDPIECE INTERPULSE COAX TIP (DISPOSABLE) ×2
IMMOBILIZER KNEE 22 UNIV (SOFTGOODS) IMPLANT
KIT BASIN OR (CUSTOM PROCEDURE TRAY) ×3 IMPLANT
KIT ROOM TURNOVER OR (KITS) ×3 IMPLANT
MANIFOLD NEPTUNE II (INSTRUMENTS) ×3 IMPLANT
MARKER SKIN DUAL TIP RULER LAB (MISCELLANEOUS) ×3 IMPLANT
NEEDLE SPNL 18GX3.5 QUINCKE PK (NEEDLE) ×3 IMPLANT
NS IRRIG 1000ML POUR BTL (IV SOLUTION) ×3 IMPLANT
PACK TOTAL JOINT (CUSTOM PROCEDURE TRAY) ×3 IMPLANT
PAD ARMBOARD 7.5X6 YLW CONV (MISCELLANEOUS) ×6 IMPLANT
PADDING CAST COTTON 6X4 STRL (CAST SUPPLIES) ×3 IMPLANT
SET HNDPC FAN SPRY TIP SCT (DISPOSABLE) ×1 IMPLANT
SET PAD KNEE POSITIONER (MISCELLANEOUS) ×3 IMPLANT
SPONGE GAUZE 2X2 STER 10/PKG (GAUZE/BANDAGES/DRESSINGS) ×2
SPONGE GAUZE 4X4 12PLY (GAUZE/BANDAGES/DRESSINGS) ×3 IMPLANT
STAPLER VISISTAT 35W (STAPLE) IMPLANT
STRIP CLOSURE SKIN 1/2X4 (GAUZE/BANDAGES/DRESSINGS) ×4 IMPLANT
SUCTION FRAZIER TIP 10 FR DISP (SUCTIONS) IMPLANT
SUT ETHILON 3 0 FSL (SUTURE) ×6 IMPLANT
SUT MNCRL AB 3-0 PS2 18 (SUTURE) ×6 IMPLANT
SUT PDS AB 1 CTX 36 (SUTURE) ×3 IMPLANT
SUT VIC AB 0 CT1 27 (SUTURE) ×2
SUT VIC AB 0 CT1 27XBRD ANBCTR (SUTURE) ×1 IMPLANT
SUT VIC AB 1 CT1 27 (SUTURE) ×4
SUT VIC AB 1 CT1 27XBRD ANBCTR (SUTURE) ×2 IMPLANT
SUT VIC AB 1 CTX 27 (SUTURE) ×3 IMPLANT
SUT VIC AB 2-0 CT1 27 (SUTURE) ×12
SUT VIC AB 2-0 CT1 TAPERPNT 27 (SUTURE) ×6 IMPLANT
SUT VLOC 180 0 24IN GS25 (SUTURE) ×3 IMPLANT
SYR 50ML LL SCALE MARK (SYRINGE) ×6 IMPLANT
TOWEL OR 17X24 6PK STRL BLUE (TOWEL DISPOSABLE) ×3 IMPLANT
TOWEL OR 17X26 10 PK STRL BLUE (TOWEL DISPOSABLE) ×3 IMPLANT
TRAY FOLEY CATH 14FRSI W/METER (CATHETERS) ×3 IMPLANT
WATER STERILE IRR 1000ML POUR (IV SOLUTION) ×6 IMPLANT
WRAP KNEE MAXI GEL POST OP (GAUZE/BANDAGES/DRESSINGS) ×3 IMPLANT

## 2013-12-05 NOTE — Anesthesia Preprocedure Evaluation (Addendum)
Anesthesia Evaluation  Patient identified by MRN, date of birth, ID band Patient awake    Reviewed: Allergy & Precautions, H&P , NPO status , Patient's Chart, lab work & pertinent test results, reviewed documented beta blocker date and time   History of Anesthesia Complications Negative for: history of anesthetic complications  Airway Mallampati: III TM Distance: >3 FB Neck ROM: Full    Dental  (+) Poor Dentition, Dental Advisory Given   Pulmonary sleep apnea and Continuous Positive Airway Pressure Ventilation ,  breath sounds clear to auscultation        Cardiovascular hypertension, Pt. on medications and Pt. on home beta blockers Rhythm:Regular Rate:Normal     Neuro/Psych PSYCHIATRIC DISORDERS Anxiety Depression    GI/Hepatic negative GI ROS, Neg liver ROS,   Endo/Other  Morbid obesity  Renal/GU negative Renal ROS     Musculoskeletal   Abdominal   Peds  Hematology   Anesthesia Other Findings   Reproductive/Obstetrics negative OB ROS                          Anesthesia Physical Anesthesia Plan  ASA: III  Anesthesia Plan: General   Post-op Pain Management: MAC Combined w/ Regional for Post-op pain   Induction: Intravenous  Airway Management Planned: Oral ETT  Additional Equipment:   Intra-op Plan:   Post-operative Plan: Extubation in OR  Informed Consent: I have reviewed the patients History and Physical, chart, labs and discussed the procedure including the risks, benefits and alternatives for the proposed anesthesia with the patient or authorized representative who has indicated his/her understanding and acceptance.   Dental advisory given  Plan Discussed with: CRNA, Anesthesiologist and Surgeon  Anesthesia Plan Comments:         Anesthesia Quick Evaluation

## 2013-12-05 NOTE — Progress Notes (Signed)
Spoke with Dr. Erlinda Hong regarding antibiotics he confirmed he wanted both ancef and vancomycin with vancomycin to be given first.

## 2013-12-05 NOTE — Preoperative (Signed)
Beta Blockers   Reason not to administer Beta Blockers:Not Applicable 

## 2013-12-05 NOTE — Anesthesia Postprocedure Evaluation (Signed)
  Anesthesia Post-op Note  Patient: Jonathan Forbes  Procedure(s) Performed: Procedure(s): LEFT TOTAL KNEE ARTHROPLASTY (Left)  Patient Location: PACU  Anesthesia Type:General  Level of Consciousness: awake and alert   Airway and Oxygen Therapy: Patient Spontanous Breathing  Post-op Pain: moderate  Post-op Assessment: Post-op Vital signs reviewed, Patient's Cardiovascular Status Stable and Respiratory Function Stable  Post-op Vital Signs: Reviewed  Filed Vitals:   12/05/13 1715  BP: 122/73  Pulse: 75  Temp:   Resp: 12    Complications: No apparent anesthesia complications

## 2013-12-05 NOTE — Op Note (Signed)
Total Knee Arthroplasty Procedure Note [name] [id number] 12/05/2013   Preoperative diagnosis: Left knee osteoarthritis  Postoperative diagnosis:same  Operative procedure: Left total knee arthroplasty. CPT [phone]  Surgeon: N. Eduard Roux, MD  Co-surgeon: none  Assistants: none  Anesthesia: general, regional  Tourniquet time: 120 mins  Implants used: Smith [employer] Nephew Femur: Legion Size 6 Tibia:Genesis II size 6 Patella: 32 mm Polyethylene: 9 mm  Indication: Jonathan Forbes is a 63 y.o. year old male with a history of knee pain. Having failed conservative management, the patient elected to proceed with a total knee arthroplasty.  We have reviewed the risk and benefits of the surgery and they elected to proceed after voicing understanding.  Procedure:  After informed consent was obtained and understanding of the risk were voiced including but not limited to bleeding, infection, damage to surrounding structures including nerves and vessels, blood clots, leg length inequality and the failure to achieve desired results, the operative extremity was marked with verbal confirmation of the patient in the holding area.   The patient was then brought to the operating room and transported to the operating room table in the supine position.  A tourniquet was applied to the operative extremity around the upper thigh. The operative limb was then prepped and draped in the usual sterile fashion and preoperative antibiotics were administered.  A time out was performed prior to the start of surgery confirming the correct extremity, preoperative antibiotic administration, as well as team members, implants and instruments available for the case. Correct surgical site was also confirmed with preoperative radiographs. The limb was then elevated for exsanguination and the tourniquet was inflated. A midline incision was made and a standard medial parapatellar approach was performed. Posterior  cruciate ligament was sacrificed. Start site was drilled in the femur and the intramedullary distal femoral cutting guide was placed, set at 5 degrees valgus, taking 11 mm of distal resection. The distal cut was made. Osteophytes were then removed. Next, the proximal tibial cutting guide was placed with appropriate slope, varus/valgus alignment and depth of resection. The proximal tibial cut was made. Gap blocks were then used to assess the extension gap and alignment, and appropriate soft tissue releases were performed. Attention was turned back to the femur, which was sized using the sizing guide to a size 6. Appropriate rotation of the femoral component was determined using epicondylar axis, Whiteside's line, and assessing the flexion gap under ligament tension. The appropriate size 4-in-1 cutting block was placed and cuts were made. Posterior femoral osteophytes and uncapped bone were then removed with the curved osteotome. The tibia was sized for a size 6 component. The femoral box-cutting guide was placed and prepared for a PS femoral component. Trial components were placed, and stability was checked in full extension, mid-flexion, and deep flexion. Proper tibial rotation was determined and marked. The patella was then prepared for a size 32 mm patellar component, and tracking of the patella was evaluated and found to be tracking well without a lateral release. Trial components were then removed and tibial preparation performed. A posterior capsular injection comprising of 20 cc of 1.3% exparel and 40 cc of normal saline was performed for postoperative pain control. The bony surfaces were irrigated with a pulse lavage and then dried. Bone cement was vacuum mixed on the back table, and the final components sized above were cemented into place. After cement had finished curing, excess cement was removed. The stability of the construct was re-evaluated throughout a range  of motion and found to be acceptable.  The trial liner was removed, the knee was copiously irrigated, and the knee was re-evaluated for any excess bone debris. The real polyethylene liner, 9 mm thick, was inserted and checked to ensure the locking mechanism had engaged appropriately. The tourniquet was deflated and hemostasis was achieved. The wound was irrigated with dilute betadine in normal saline, and then again with normal saline. A drain was placed. Capsular closure was performed with a #1 vicryl and #1 PDS, subcutaneous fat closed with a 0 vicryl suture, then subcutaneous tissue closed with interrupted 2.0 vicryl suture. The skin was then closed with a 3.0 nylon. A sterile dressing was applied.   The patient was awakened in the operating room and taken to recovery in stable condition. All sponge, needle, and instrument counts were correct at the end of the case.  Position: supine  Complications: none.  Time Out: performed   Drains/Packing: 1 hemovac  Estimated blood loss: 50 cc  Returned to Recovery Room: in good condition.   Antibiotics: yes   Mechanical VTE (DVT) Prophylaxis: sequential compression devices, TED thigh-high  Chemical VTE (DVT) Prophylaxis: aspirin  Fluid Replacement  Crystalloid: see anesthesia record Blood: none  FFP: none   Specimens Removed: 1 to pathology   Sponge and Instrument Count Correct? yes   PACU: portable radiograph - knee AP and Lateral   Admission: inpatient status, start PT & OT POD#1  Plan/RTC: Return in 2 weeks for wound check.   Weight Bearing/Load Lower Extremity: full   N. Eduard Roux, MD [place] 4:24 PM

## 2013-12-05 NOTE — Progress Notes (Signed)
Orthopedic Tech Progress Note Patient Details:  Jonathan Forbes 10-19-1951 [id number]  CPM Left Knee CPM Left Knee: On Left Knee Flexion (Degrees): 90 Left Knee Extension (Degrees): 0 Additional Comments: put ohf on bed   [name] 12/05/2013, 7:04 PM

## 2013-12-05 NOTE — Transfer of Care (Signed)
Immediate Anesthesia Transfer of Care Note  Patient: Jonathan Forbes  Procedure(s) Performed: Procedure(s): LEFT TOTAL KNEE ARTHROPLASTY (Left)  Patient Location: PACU  Anesthesia Type:General  Level of Consciousness: awake, alert , oriented and patient cooperative  Airway & Oxygen Therapy: Patient Spontanous Breathing and Patient connected to face mask oxygen  Post-op Assessment: Report given to PACU RN and Post -op Vital signs reviewed and stable  Post vital signs: Reviewed  Complications: No apparent anesthesia complications

## 2013-12-05 NOTE — Anesthesia Procedure Notes (Signed)
Anesthesia Regional Block:  Femoral nerve block  Pre-Anesthetic Checklist: ,, timeout performed, Correct Patient, Correct Site, Correct Laterality, Correct Procedure, Correct Position, site marked, Risks and benefits discussed,  Surgical consent,  Pre-op evaluation,  At surgeon's request and post-op pain management  Laterality: Left and Lower  Prep: chloraprep       Needles:  Injection technique: Single-shot  Needle Type: Echogenic Stimulator Needle          Additional Needles:  Procedures: ultrasound guided (picture in chart) Femoral nerve block Narrative:  Start time: 12/05/2013 12:12 PM End time: 12/05/2013 12:19 PM Injection made incrementally with aspirations every 5 mL.  Performed by: Personally  Anesthesiologist: Ermalene Postin

## 2013-12-05 NOTE — H&P (Signed)
PREOPERATIVE H&P  Chief Complaint: left knee osteoarthritis  HPI: Jonathan Forbes is a 63 y.o. male who presents for surgical treatment of left knee osteoarthritis.  He denies any changes in medical history.  Past Medical History  Diagnosis Date  . Hypertension   . OA (osteoarthritis) of knee     Hx: of B/L knees  . Depression   . Cancer     Hx: of skin cancer  . Glaucoma     Hx; of beginning stages  . Eczema     Hx; of  . Anxiety   . Sleep apnea   . Mitral valve regurgitation     Hx; of slight   Past Surgical History  Procedure Laterality Date  . Tumor excision      skin cancer of right side of back  . Cataract extraction w/ intraocular lens  implant, bilateral      Hx; of  . Colonoscopy      Hx; of  . Tonsillectomy     History   Social History  . Marital Status: Divorced    Spouse Name: N/A    Number of Children: N/A  . Years of Education: N/A   Social History Main Topics  . Smoking status: Never Smoker   . Smokeless tobacco: Never Used  . Alcohol Use: Yes     Comment: rare  . Drug Use: No  . Sexual Activity: Not on file   Other Topics Concern  . Not on file   Social History Narrative  . No narrative on file   Family History  Problem Relation Age of Onset  . Heart disease Father   . Hypertension Father    No Known Allergies Prior to Admission medications   Medication Sig Start Date End Date Taking? Authorizing Provider  amLODipine (NORVASC) 10 MG tablet Take 10 mg by mouth daily.   Yes Historical Provider, MD  carvedilol (COREG) 25 MG tablet Take 25 mg by mouth 2 (two) times daily with a meal.   Yes Historical Provider, MD  furosemide (LASIX) 40 MG tablet Take 40 mg by mouth daily.   Yes Historical Provider, MD  lisinopril (PRINIVIL,ZESTRIL) 40 MG tablet Take 40 mg by mouth daily.   Yes Historical Provider, MD  traMADol (ULTRAM) 50 MG tablet Take 50 mg by mouth 4 (four) times daily.   Yes Historical Provider, MD  Vilazodone HCl (VIIBRYD) 40 MG  TABS Take 40 mg by mouth daily.   Yes Historical Provider, MD  traZODone (DESYREL) 100 MG tablet Take 100 mg by mouth at bedtime.    Historical Provider, MD     Positive ROS: All other systems have been reviewed and were otherwise negative with the exception of those mentioned in the HPI and as above.  Physical Exam: General: Alert, no acute distress Cardiovascular: No pedal edema Respiratory: No cyanosis, no use of accessory musculature GI: No organomegaly, abdomen is soft and non-tender Skin: No lesions in the area of chief complaint Neurologic: Sensation intact distally Psychiatric: Patient is competent for consent with normal mood and affect Lymphatic: No axillary or cervical lymphadenopathy  MUSCULOSKELETAL:  - exam stable  Assessment: left knee osteoarthritis  Plan: Plan for Procedure(s): LEFT TOTAL KNEE ARTHROPLASTY  The risks benefits and alternatives were discussed with the patient including but not limited to the risks of nonoperative treatment, versus surgical intervention including infection, bleeding, nerve injury,  blood clots, cardiopulmonary complications, morbidity, mortality, among others, and they were willing to proceed.   Erlinda Hong, Iya Hamed  Legrand Como, MD   12/05/2013 10:12 AM

## 2013-12-06 ENCOUNTER — Encounter (HOSPITAL_COMMUNITY): Payer: Self-pay | Admitting: Orthopaedic Surgery

## 2013-12-06 LAB — URINE CULTURE
COLONY COUNT: NO GROWTH
CULTURE: NO GROWTH

## 2013-12-06 LAB — CBC
HEMATOCRIT: 37.4 % — AB (ref 39.0–52.0)
Hemoglobin: 12.3 g/dL — ABNORMAL LOW (ref 13.0–17.0)
MCH: 29.6 pg (ref 26.0–34.0)
MCHC: 32.9 g/dL (ref 30.0–36.0)
MCV: 90.1 fL (ref 78.0–100.0)
Platelets: 224 10*3/uL (ref 150–400)
RBC: 4.15 MIL/uL — ABNORMAL LOW (ref 4.22–5.81)
RDW: 13.7 % (ref 11.5–15.5)
WBC: 11.9 10*3/uL — ABNORMAL HIGH (ref 4.0–10.5)

## 2013-12-06 LAB — BASIC METABOLIC PANEL
BUN: 19 mg/dL (ref 6–23)
CO2: 27 mEq/L (ref 19–32)
Calcium: 8.3 mg/dL — ABNORMAL LOW (ref 8.4–10.5)
Chloride: 99 mEq/L (ref 96–112)
Creatinine, Ser: 1.03 mg/dL (ref 0.50–1.35)
GFR calc Af Amer: 88 mL/min — ABNORMAL LOW (ref >90)
GFR, EST NON AFRICAN AMERICAN: 76 mL/min — AB (ref >90)
Glucose, Bld: 122 mg/dL — ABNORMAL HIGH (ref 70–99)
Potassium: 3.8 mEq/L (ref 3.7–5.3)
Sodium: 137 mEq/L (ref 137–147)

## 2013-12-06 MED ORDER — OXYCODONE HCL 5 MG PO TABS: 5.0000 mg | ORAL_TABLET | ORAL | Status: DC | PRN

## 2013-12-06 MED ORDER — ASPIRIN EC 325 MG PO TBEC: 325.0000 mg | DELAYED_RELEASE_TABLET | Freq: Two times a day (BID) | ORAL | Status: DC

## 2013-12-06 NOTE — Plan of Care (Signed)
Problem: Consults Goal: Diagnosis- Total Joint Replacement Primary Total Knee Left     

## 2013-12-06 NOTE — Discharge Instructions (Signed)
1. Remove dressing 12/13/13 and then place a gauze dressing with paper tape daily. 2. May shower after postoperative day 10.  Pat incision dry with towel. 3. Do not submerge incision. 4. Use foot roll when in bed. 5. Use CPM 6 hours a day.

## 2013-12-06 NOTE — Progress Notes (Signed)
Utilization review completed.  

## 2013-12-06 NOTE — Progress Notes (Signed)
Orthopedic Tech Progress Note Patient Details:  Jonathan Forbes 19-Jul-1951 [id number] Foot roll     Cammer, Theodoro Parma 12/06/2013, 8:51 AM

## 2013-12-06 NOTE — Care Management Note (Signed)
CARE MANAGEMENT NOTE 12/06/2013  Patient:  Jonathan Forbes, Jonathan Forbes   Account Number:  [id number]  Date Initiated:  12/06/2013  Documentation initiated by:  Ricki Miller  Subjective/Objective Assessment:   63 yr old male s/p left total knee arthroplasty.     Action/Plan:   Case Manager spoke with patient concerning home health needs at discharge. Patient states he lives alone and will need shortterm rehab. Social worker notified.   Anticipated DC Date:  12/07/2013   Anticipated DC Plan:  SKILLED NURSING FACILITY  In-house referral  Clinical Social Worker      DC Planning Services  CM consult      Choice offered to / List presented to:             Status of service:  In process, will continue to follow

## 2013-12-06 NOTE — Discharge Summary (Signed)
Physician Discharge Summary      Patient ID: Jonathan Forbes MRN: [id number] DOB/AGE: [birth year] 62 y.o.  Admit date: 12/05/2013 Discharge date: 12/11/2013  Admission Diagnoses:  Osteoarthritis of left knee  Discharge Diagnoses:  Principal Problem:   Osteoarthritis of left knee Active Problems:   Total knee replacement status   Past Medical History  Diagnosis Date  . Hypertension   . OA (osteoarthritis) of knee     Hx: of B/L knees  . Depression   . Cancer     Hx: of skin cancer  . Glaucoma     Hx; of beginning stages  . Eczema     Hx; of  . Anxiety   . Sleep apnea   . Mitral valve regurgitation     Hx; of slight    Surgeries: Procedure(s): LEFT TOTAL KNEE ARTHROPLASTY on 12/05/2013   Consultants (if any):    Discharged Condition: Improved  Hospital Course: Jonathan Forbes is an 63 y.o. male who was admitted 12/05/2013 with a diagnosis of Osteoarthritis of left knee and went to the operating room on 12/05/2013 and underwent the above named procedures.    He was given perioperative antibiotics:      Anti-infectives   Start     Dose/Rate Route Frequency Ordered Stop   12/05/13 1930  vancomycin (VANCOCIN) 1,250 mg in sodium chloride 0.9 % 250 mL IVPB     1,250 mg 166.7 mL/hr over 90 Minutes Intravenous Every 12 hours 12/05/13 1907 12/06/13 0921   12/05/13 1930  cefTRIAXone (ROCEPHIN) 1 g in dextrose 5 % 50 mL IVPB     1 g 100 mL/hr over 30 Minutes Intravenous Every 24 hours 12/05/13 1907 12/12/13 1929   12/05/13 1500  cefTRIAXone (ROCEPHIN) 1 g in dextrose 5 % 50 mL IVPB     1 g 100 mL/hr over 30 Minutes Intravenous  Once 12/05/13 1452 12/05/13 1455   12/05/13 0600  vancomycin (VANCOCIN) 1,500 mg in sodium chloride 0.9 % 500 mL IVPB     1,500 mg 250 mL/hr over 120 Minutes Intravenous On call to O.R. 12/04/13 1437 12/05/13 1300   12/05/13 0600  ceFAZolin (ANCEF) 3 g in dextrose 5 % 50 mL IVPB     3 g 160 mL/hr over 30 Minutes Intravenous On call to O.R.  12/04/13 1437 12/05/13 1327    .  He was given sequential compression devices, early ambulation, and aspirin for DVT prophylaxis.  He benefited maximally from the hospital stay and there were no complications.    Recent vital signs:  Filed Vitals:   12/11/13 1403  BP: 136/74  Pulse: 79  Temp: 98.3 F (36.8 C)  Resp: 16    Recent laboratory studies:  Lab Results  Component Value Date   HGB 11.3* 12/08/2013   HGB 11.4* 12/07/2013   HGB 12.3* 12/06/2013   Lab Results  Component Value Date   WBC 12.8* 12/08/2013   PLT 203 12/08/2013   Lab Results  Component Value Date   INR 0.98 11/29/2013   Lab Results  Component Value Date   NA 137 12/06/2013   K 3.8 12/06/2013   CL 99 12/06/2013   CO2 27 12/06/2013   BUN 19 12/06/2013   CREATININE 1.03 12/06/2013   GLUCOSE 122* 12/06/2013    Discharge Medications:     Medication List         amLODipine 10 MG tablet  Commonly known as:  NORVASC  Take 10 mg by mouth daily.  aspirin EC 325 MG tablet  Take 1 tablet (325 mg total) by mouth 2 (two) times daily.     aspirin EC 325 MG tablet  Take 1 tablet (325 mg total) by mouth 2 (two) times daily.     carvedilol 25 MG tablet  Commonly known as:  COREG  Take 25 mg by mouth 2 (two) times daily with a meal.     furosemide 40 MG tablet  Commonly known as:  LASIX  Take 40 mg by mouth daily.     lisinopril 40 MG tablet  Commonly known as:  PRINIVIL,ZESTRIL  Take 40 mg by mouth daily.     oxyCODONE 5 MG immediate release tablet  Commonly known as:  Oxy IR/ROXICODONE  Take 1-3 tablets (5-15 mg total) by mouth every 4 (four) hours as needed.     oxyCODONE 5 MG immediate release tablet  Commonly known as:  Oxy IR/ROXICODONE  Take 1-3 tablets (5-15 mg total) by mouth every 4 (four) hours as needed.     traMADol 50 MG tablet  Commonly known as:  ULTRAM  Take 50 mg by mouth 4 (four) times daily.     traZODone 100 MG tablet  Commonly known as:  DESYREL  Take 100 mg by mouth at  bedtime.     VIIBRYD 40 MG Tabs  Generic drug:  Vilazodone HCl  Take 40 mg by mouth daily.        Diagnostic Studies: Dg Chest 2 View  12/05/2013   CLINICAL DATA:  Left total knee arthroplasty. Hypertension. Sleep apnea. Mitral regurgitation.  EXAM: CHEST  2 VIEW  COMPARISON:  DG CHEST 2 VIEW dated 04/05/2005; XR-CHEST 1 VIEW dated 07/18/2012  FINDINGS: Mild cardiomegaly.  No edema.  The lungs appear clear.  Mild thoracic spondylosis.  IMPRESSION: 1. Mild cardiomegaly, without edema.   Electronically Signed   By: Sherryl Barters M.D.   On: 12/05/2013 10:51   Dg Knee Left Port  12/05/2013   CLINICAL DATA:  Left knee arthroplasty  EXAM: PORTABLE LEFT KNEE - 1-2 VIEW  COMPARISON:  None.  FINDINGS: The left knee demonstrates a total knee arthroplasty without evidence of hardware failure complication. There is no significant joint effusion. There is no fracture or dislocation. The alignment is anatomic. Surgical drains are present. Post-surgical changes noted in the surrounding soft tissues.  IMPRESSION: Left total knee arthroplasty.   Electronically Signed   By: Kathreen Devoid   On: 12/05/2013 17:14    Disposition: comprehensive inpatient rehab  Discharge Orders   Future Orders Complete By Expires   Call MD / Call 911  As directed    Comments:     If you experience chest pain or shortness of breath, CALL 911 and be transported to the hospital emergency room.  If you develope a fever above 101 F, pus (white drainage) or increased drainage or redness at the wound, or calf pain, call your surgeon's office.   Call MD / Call 911  As directed    Comments:     If you experience chest pain or shortness of breath, CALL 911 and be transported to the hospital emergency room.  If you develope a fever above 101.5 F, pus (white drainage) or increased drainage or redness at the wound, or calf pain, call your surgeon's office.   Constipation Prevention  As directed    Comments:     Drink plenty of fluids.   Prune juice may be helpful.  You may use a stool softener,  such as Colace (over the counter) 100 mg twice a day.  Use MiraLax (over the counter) for constipation as needed.   Constipation Prevention  As directed    Comments:     Drink plenty of fluids.  Prune juice may be helpful.  You may use a stool softener, such as Colace (over the counter) 100 mg twice a day.  Use MiraLax (over the counter) for constipation as needed.   Diet - low sodium heart healthy  As directed    Diet - low sodium heart healthy  As directed    Do not put a pillow under the knee. Place it under the heel.  As directed    Driving restrictions  As directed    Comments:     No driving while taking narcotic pain meds.   Increase activity slowly as tolerated  As directed    Increase activity slowly as tolerated  As directed    TED hose  As directed    Comments:     Use stockings (TED hose) for 6 weeks on both leg(s).  You may remove them at night for sleeping.   Weight bearing as tolerated  As directed    Questions:     Laterality:     Extremity:     Weight bearing as tolerated  As directed    Questions:     Laterality:     Extremity:        Follow-up Information   Follow up with Marianna Payment, MD In 2 weeks.   Specialty:  Orthopedic Surgery   Contact information:   [place] [address] [phone]        Signed: Marianna Payment 12/11/2013, 3:25 PM

## 2013-12-06 NOTE — Evaluation (Signed)
Physical Therapy Evaluation Patient Details Name: Jonathan Forbes MRN: [id number] DOB: Mar 09, 1951 Today's Date: 12/06/2013 Time: 0102-7253 PT Time Calculation (min): 30 min  PT Assessment / Plan / Recommendation History of Present Illness  L TKA  Clinical Impression  This patient underwent a left TKA and presents to PT with anticipated post-op decrease in strength and ROM, decreased functional mobility and gait.  Pt. Will benefit from acute PT to address these and below issues.  Pt. Lives alone and will need SNF level therapies before he is able to be home alone.     PT Assessment  Patient needs continued PT services    Follow Up Recommendations  SNF    Does the patient have the potential to tolerate intense rehabilitation      Barriers to Discharge Decreased caregiver support pt.lives alone    Equipment Recommendations  Rolling walker with 5" wheels;3in1 (PT)    Recommendations for Other Services     Frequency 7X/week    Precautions / Restrictions Precautions Precautions: Knee Precaution Booklet Issued: Yes (comment) Precaution Comments: reviewed knee precautions and provided handout for precautions and exercises Required Braces or Orthoses: Knee Immobilizer - Left Knee Immobilizer - Left: On when out of bed or walking Restrictions Weight Bearing Restrictions: Yes LLE Weight Bearing: Weight bearing as tolerated Other Position/Activity Restrictions: Pt. knew he is not to have anything under knee (pillow,etc)   Pertinent Vitals/Pain See vitals tab       Mobility  Bed Mobility Overal bed mobility:  (not assessed; pt. already in recliner) Transfers Overall transfer level: Needs assistance Equipment used: Rolling walker (2 wheeled) Transfers: Sit to/from Stand Sit to Stand: +2 physical assistance;Min assist General transfer comment: cues for LE  and hand placement , min assist of 2 to boost to stand Ambulation/Gait Ambulation/Gait assistance: Min assist;+2  safety/equipment Ambulation Distance (Feet): 50 Feet Assistive device: Rolling walker (2 wheeled) Gait Pattern/deviations: Step-to pattern;Decreased step length - right;Decreased step length - left;Trunk flexed General Gait Details: cues for upright posture and for encouraging heel strike to activate quads    Exercises Total Joint Exercises Ankle Circles/Pumps: AROM;Both;10 reps Quad Sets: AROM;Both;10 reps Short Arc Quad: AROM;Left;10 reps Knee Flexion: AROM;Left;5 reps;Seated Goniometric ROM: ~ 0 to 80   PT Diagnosis: Difficulty walking;Generalized weakness;Acute pain (weakness L LE)  PT Problem List: Decreased strength;Decreased range of motion;Decreased activity tolerance;Decreased balance;Decreased mobility;Decreased knowledge of use of DME;Decreased knowledge of precautions;Obesity;Pain PT Treatment Interventions: DME instruction;Gait training;Functional mobility training;Therapeutic activities;Therapeutic exercise;Balance training;Patient/family education     PT Goals(Current goals can be found in the care plan section) Acute Rehab PT Goals Patient Stated Goal: rehab then home independently PT Goal Formulation: With patient Time For Goal Achievement: 12/13/13 Potential to Achieve Goals: Good  Visit Information  Last PT Received On: 12/06/13 Assistance Needed: +2 (2 for equipment and safety due to body habitus) History of Present Illness: L TKA       Prior [place] expects to be discharged to:: Skilled nursing facility Living Arrangements: Alone Prior Function Level of Independence: Independent Communication Communication: No difficulties    Cognition  Cognition Arousal/Alertness: Awake/alert Behavior During Therapy: WFL for tasks assessed/performed Overall Cognitive Status: Within Functional Limits for tasks assessed    Extremity/Trunk Assessment Upper Extremity Assessment Upper Extremity Assessment: Overall WFL for tasks  assessed Lower Extremity Assessment Lower Extremity Assessment: LLE deficits/detail LLE Deficits / Details: good quad set and ankle pump; SAQ active through -10 range Cervical / Trunk Assessment Cervical / Trunk Assessment:  Normal   Balance Balance Overall balance assessment: Needs assistance Standing balance support: Bilateral upper extremity supported;During functional activity Standing balance-Leahy Scale: Fair  End of Session PT - End of Session Equipment Utilized During Treatment: Gait belt;Left knee immobilizer Activity Tolerance: Patient tolerated treatment well Patient left: in chair;with call bell/phone within reach Nurse Communication: Mobility status CPM Left Knee Additional Comments: foot roll  GP     [name] 12/06/2013, 11:02 AM [name] PT Acute Rehab Services [phone] [place] [phone]

## 2013-12-06 NOTE — Progress Notes (Signed)
   Subjective:  Patient reports pain as moderate.  No events  Objective:   VITALS:   Filed Vitals:   12/05/13 2200 12/06/13 0000 12/06/13 0400 12/06/13 0634  BP: 158/97   152/92  Pulse: 97   95  Temp: 99.1 F (37.3 C)   98.9 F (37.2 C)  TempSrc:      Resp: 16 18 18 18   SpO2: 94% 97% 98% 95%    Neurologically intact Neurovascular intact Sensation intact distally Intact pulses distally Dorsiflexion/Plantar flexion intact Incision: dressing C/D/I and no drainage No cellulitis present Compartment soft   Lab Results  Component Value Date   WBC 11.9* 12/06/2013   HGB 12.3* 12/06/2013   HCT 37.4* 12/06/2013   MCV 90.1 12/06/2013   PLT 224 12/06/2013     Assessment/Plan: 1 Day Post-Op   Problem List Items Addressed This Visit   None      Expected postop acute blood loss anemia - will monitor for symptoms CPM 0-90, advance 10 degrees each day for 6 hrs a day Knee extension protocol when not in CPM Up with PT/OT DVT ppx - SCDs, ambulation, asa WBAT left lower extremity Rocephin for UTI Foley d/c'ed  HVAC d/c'ed  Pain control Discharge planning   Marianna Payment 12/06/2013, 8:22 AM [phone]

## 2013-12-07 LAB — CBC
HCT: 34.3 % — ABNORMAL LOW (ref 39.0–52.0)
HEMOGLOBIN: 11.4 g/dL — AB (ref 13.0–17.0)
MCH: 30.4 pg (ref 26.0–34.0)
MCHC: 33.2 g/dL (ref 30.0–36.0)
MCV: 91.5 fL (ref 78.0–100.0)
Platelets: 191 10*3/uL (ref 150–400)
RBC: 3.75 MIL/uL — ABNORMAL LOW (ref 4.22–5.81)
RDW: 13.9 % (ref 11.5–15.5)
WBC: 13.2 10*3/uL — AB (ref 4.0–10.5)

## 2013-12-07 MED ORDER — ASPIRIN EC 325 MG PO TBEC: 325.0000 mg | DELAYED_RELEASE_TABLET | Freq: Two times a day (BID) | ORAL | Status: DC

## 2013-12-07 MED ORDER — OXYCODONE HCL 5 MG PO TABS: 5.0000 mg | ORAL_TABLET | ORAL | Status: DC | PRN

## 2013-12-07 NOTE — Progress Notes (Signed)
Patient needs CPM x 6 weeks  N. Eduard Roux, MD [facility] [phone] 5:56 PM

## 2013-12-07 NOTE — Progress Notes (Signed)
Physical Therapy Treatment Patient Details Name: Jonathan Forbes MRN: [id number] DOB: September 02, 1951 Today's Date: 12/07/2013 Time: 5277-8242 PT Time Calculation (min): 23 min  PT Assessment / Plan / Recommendation  History of Present Illness L TKA   PT Comments   Pt. In bed complaining of spasms in back with noted numbness around R upper thigh/hip.  RN Jonathan Forbes made aware.  Session focused on exercises since he was recently assisted back in bed and didn't want to get up right away.  SW informs this therapist that since pt. Has medicaid, he will likely not get therapies at the SNF he will go to due to Jonathan Forbes regs.  Need to reinforce exercises as much as possible while he is still in house.    Follow Up Recommendations  SNF     Does the patient have the potential to tolerate intense rehabilitation     Barriers to Discharge        Equipment Recommendations  Rolling walker with 5" wheels;3in1 (PT)    Recommendations for Other Services    Frequency 7X/week   Progress towards PT Goals Progress towards PT goals: Progressing toward goals  Plan Current plan remains appropriate    Precautions / Restrictions Precautions Precautions: Knee Required Braces or Orthoses: Knee Immobilizer - Left Knee Immobilizer - Left: On when out of bed or walking Restrictions Weight Bearing Restrictions: Yes LLE Weight Bearing: Weight bearing as tolerated   Pertinent Vitals/Pain See vitals tab Pain is somewhat  Of a limiting factor in his progress this pm.      Mobility  Bed Mobility Overal bed mobility: Needs Assistance;+2 for physical assistance (not assessed, up in recliner) Bed Mobility: Supine to Sit Supine to sit: Mod assist;+2 for physical assistance General bed mobility comments: pt. able to initiate moving L LE to edge of bed but needed 2 assist to move to sit at EOB Transfers Overall transfer level: Needs assistance Equipment used: Rolling walker (2 wheeled) Transfers: Sit to/from Stand Sit to  Stand: +2 physical assistance;Min assist General transfer comment: raised height of bed, pt. needed assist to power up to stand; increased difficulty with this task due to body habitus Ambulation/Gait Ambulation/Gait assistance: Min assist;+2 safety/equipment Ambulation Distance (Feet): 65 Feet Assistive device: Rolling walker (2 wheeled) Gait Pattern/deviations: Step-to pattern Gait velocity: decreased General Gait Details: vc for heel strike, quad activation and erect stance    Exercises Total Joint Exercises Ankle Circles/Pumps: AROM;Both;10 reps Quad Sets: AROM;Both;10 reps Short Arc Quad: Left;10 reps;AAROM Heel Slides: AAROM;10 reps;Left Hip ABduction/ADduction: AAROM;Left;10 reps Straight Leg Raises: AAROM;Left;10 reps Knee Flexion: AROM;Left;5 reps;Seated Goniometric ROM: 0 to 85   PT Diagnosis:    PT Problem List:   PT Treatment Interventions:     PT Goals (current goals can now be found in the care plan section)    Visit Information  Last PT Received On: 12/07/13 Assistance Needed: +2 (1 for in bed exercises) History of Present Illness: L TKA    Subjective Data  Subjective: Pt. frequently inquires to see if what he is experiencing is "normal", (level of weakness, of pain, etc)   Cognition  Cognition Arousal/Alertness: Awake/alert Behavior During Therapy: WFL for tasks assessed/performed Overall Cognitive Status: Within Functional Limits for tasks assessed    Balance     End of Session PT - End of Session Equipment Utilized During Treatment: Gait belt;Left knee immobilizer Activity Tolerance: Patient limited by pain Patient left: in bed Nurse Communication: Mobility status   GP     Jonathan Forbes,  Jonathan Forbes 12/07/2013, 3:07 PM Jonathan Forbes PT Acute Rehab Services [phone] [place] [phone]

## 2013-12-07 NOTE — Clinical Social Work Placement (Signed)
Clinical Social Work Department  CLINICAL SOCIAL WORK PLACEMENT NOTE   Patient: Jonathan Forbes Account Number: [id number]  Admit date: 12/05/13 Clinical Social Worker: Rhea Pink LCSWA Date/time: 12/07/2013 11:30 AM  Clinical Social Work is seeking post-discharge placement for this patient at the following level of care: SKILLED NURSING (*CSW will update this form in Epic as items are completed)  12/07/2013 Patient/family provided with [place] Department of Clinical Social Work's list of facilities offering this level of care within the geographic area requested by the patient (or if unable, by the patient's family).  2/13/2015Patient/family informed of their freedom to choose among providers that offer the needed level of care, that participate in Medicare, Medicaid or managed care program needed by the patient, have an available bed and are willing to accept the patient.  2/13/2015Patient/family informed of [facility]' ownership interest in [facility], as well as of the fact that they are under no obligation to receive care at this facility.  PASARR submitted to [employer] on   PASARR number received from [place] on  FL2 transmitted to all facilities in geographic area requested by pt/family on 12/07/2013 FL2 transmitted to all facilities within larger geographic area on  Patient informed that his/her managed care company has contracts with or will negotiate with certain facilities, including the following:  Patient/family informed of bed offers received:  Patient chooses bed at  Physician recommends and patient chooses bed at  Patient to be transferred to on  Patient to be transferred to facility by  The following physician request were entered in Epic:  Additional Comments:

## 2013-12-07 NOTE — Clinical Social Work Psychosocial (Signed)
Clinical Social Work Department  BRIEF PSYCHOSOCIAL ASSESSMENT  Patient: Jonathan Forbes Account Number: [id number]  Admit date: 12/05/13 Clinical Social Worker Rhea Pink, MSW Date/Time: 12/07/13 12:30 PM Referred by: Physician Date Referred: 12/06/13 Referred for   SNF Placement   Other Referral:  Interview type: Patient  Other interview type: PSYCHOSOCIAL DATA  Living Status: ALone Admitted from facility:  Level of care:  Primary support name: Hilgert,Chris  Primary support relationship to patient: Son Degree of support available:  Fair  CURRENT CONCERNS  Current Concerns   Post-Acute Placement   Other Concerns:  SOCIAL WORK ASSESSMENT / PLAN  CSW met with pt re: PT recommendation for SNF.   Pt lives alone and is scared to return home without any help  CSW explained placement process and answered questions.   Pt reports no preference at this time     CSW completed FL2 and initiated SNF search.     Assessment/plan status: Information/Referral to [employer]  Other assessment/ plan:  Information/referral to community resources:  SNF     PATIENT'S/FAMILY'S RESPONSE TO PLAN OF CARE:  Pt  reports he is agreeable to ST SNF in order to increase strength and independence with mobility prior to returning home  Patient stated he was anxious to retrun home without any help. CSW listened to patient and validated patient's feelings.Pt verbalized understanding of placement process and appreciation for CSW assist.   Rhea Pink, MSW, [place]

## 2013-12-07 NOTE — Progress Notes (Signed)
OT NOTE  OT order acknowledged. Noted pt plan to D/C to SNF. Plan to defer OT to SNF. If eval needed for SNF admission, please page [phone] or [phone].   Thank you Maurie Boettcher, OTR/L  [phone] 12/07/2013

## 2013-12-07 NOTE — Progress Notes (Signed)
Physical Therapy Treatment Patient Details Name: Jonathan Forbes MRN: [id number] DOB: 1951-09-30 Today's Date: 12/07/2013 Time: 7494-4967 PT Time Calculation (min): 36 min  PT Assessment / Plan / Recommendation  History of Present Illness L TKA   PT Comments   Pt with difficulty rising to stand due to body habitus and generalized weakness on new TKA.  He is progressing but needs ongoing PT at SNF level to reach functional independence for return home alone.  Follow Up Recommendations  SNF     Does the patient have the potential to tolerate intense rehabilitation     Barriers to Discharge        Equipment Recommendations  Rolling walker with 5" wheels;3in1 (PT)    Recommendations for Other Services    Frequency 7X/week   Progress towards PT Goals Progress towards PT goals: Progressing toward goals  Plan Current plan remains appropriate    Precautions / Restrictions Precautions Precautions: Knee Required Braces or Orthoses: Knee Immobilizer - Left Knee Immobilizer - Left: On when out of bed or walking Restrictions Weight Bearing Restrictions: Yes LLE Weight Bearing: Weight bearing as tolerated   Pertinent Vitals/Pain See vitals tab     Mobility  Bed Mobility Overal bed mobility: Needs Assistance;+2 for physical assistance (not assessed, up in recliner) Bed Mobility: Supine to Sit Supine to sit: Mod assist;+2 for physical assistance General bed mobility comments: pt. able to initiate moving L LE to edge of bed but needed 2 assist to move to sit at EOB Transfers Overall transfer level: Needs assistance Equipment used: Rolling walker (2 wheeled) Transfers: Sit to/from Stand Sit to Stand: +2 physical assistance;Min assist General transfer comment: raised height of bed, pt. needed assist to power up to stand; increased difficulty with this task due to body habitus Ambulation/Gait Ambulation/Gait assistance: Min assist;+2 safety/equipment Ambulation Distance (Feet): 65  Feet Assistive device: Rolling walker (2 wheeled) Gait Pattern/deviations: Step-to pattern Gait velocity: decreased General Gait Details: vc for heel strike, quad activation and erect stance    Exercises Total Joint Exercises Ankle Circles/Pumps: AROM;Both;10 reps Quad Sets: AROM;Both;10 reps Short Arc Quad: Left;10 reps;AAROM Hip ABduction/ADduction: AAROM;Left;10 reps Straight Leg Raises: AAROM;Left;10 reps Knee Flexion: AROM;Left;5 reps;Seated Goniometric ROM: 0 to 85   PT Diagnosis:    PT Problem List:   PT Treatment Interventions:     PT Goals (current goals can now be found in the care plan section)    Visit Information  Last PT Received On: 12/07/13 Assistance Needed: +2 (for equipment and recliner) History of Present Illness: L TKA    Subjective Data  Subjective: " I am sorry I am in a bad mood, I just hurt"   Cognition  Cognition Arousal/Alertness: Awake/alert Behavior During Therapy: WFL for tasks assessed/performed Overall Cognitive Status: Within Functional Limits for tasks assessed    Balance     End of Session PT - End of Session Equipment Utilized During Treatment: Gait belt;Left knee immobilizer Activity Tolerance: Patient tolerated treatment well Patient left: in chair;with call bell/phone within reach Nurse Communication: Mobility status   GP     [name] 12/07/2013, 1:58 PM [name] PT Acute Rehab Services [place] [phone]

## 2013-12-07 NOTE — Progress Notes (Addendum)
   Subjective:  Patient reports pain as moderate.  No events.   Objective:   VITALS:   Filed Vitals:   12/07/13 0000 12/07/13 0312 12/07/13 0634 12/07/13 1144  BP:   145/70 115/92  Pulse:   72 85  Temp:   98.7 F (37.1 C) 98.8 F (37.1 C)  TempSrc:    Oral  Resp: 16 14 18 18   SpO2: 98% 99% 99% 100%    Neurologically intact Neurovascular intact Sensation intact distally Intact pulses distally Dorsiflexion/Plantar flexion intact Incision: dressing C/D/I and no drainage No cellulitis present Compartment soft   Lab Results  Component Value Date   WBC 13.2* 12/07/2013   HGB 11.4* 12/07/2013   HCT 34.3* 12/07/2013   MCV 91.5 12/07/2013   PLT 191 12/07/2013     Assessment/Plan: 2 Days Post-Op   Problem List Items Addressed This Visit     Musculoskeletal and Integument   *Osteoarthritis of left knee - Primary   Relevant Medications      traMADol (ULTRAM) 50 MG tablet      traMADol (ULTRAM) tablet 50 mg      aspirin EC tablet 325 mg      acetaminophen (TYLENOL) tablet 1,000 mg (Completed)      OxyCODONE (OXYCONTIN) 12 hr tablet 15 mg      acetaminophen (TYLENOL) tablet 650 mg      acetaminophen (TYLENOL) suppository 650 mg      oxyCODONE (Oxy IR/ROXICODONE) immediate release tablet 5-10 mg      morphine 2 MG/ML injection 2 mg      ketorolac (TORADOL) 15 MG/ML injection 30 mg      methocarbamol (ROBAXIN) tablet 500 mg      oxyCODONE (Oxy IR/ROXICODONE) immediate release tablet 5 mg (Completed)      oxyCODONE (ROXICODONE) 5 MG/5ML solution 5 mg (Completed)      HYDROmorphone (DILAUDID) 1 MG/ML injection (Completed)      oxyCODONE (Oxy IR/ROXICODONE) 5 MG immediate release tablet (Completed)      ketorolac (TORADOL) 30 MG/ML injection 30 mg (Completed)      aspirin EC tablet      oxyCODONE (Oxy IR/ROXICODONE) immediate release tablet      aspirin EC tablet      oxyCODONE (Oxy IR/ROXICODONE) immediate release tablet   Other Relevant Orders      Weight bearing as  tolerated      Weight bearing as tolerated     Other   Total knee replacement status   Relevant Orders      Weight bearing as tolerated      Expected postop acute blood loss anemia - will monitor for symptoms Up with PT/OT DVT ppx - SCDs, ambulation, asa WBAT left lower extremity CPM Knee extension protocol Pain control Discharge planning - SNF pending, possibly sat Rx in chart Rocephin IV q24h x 7 days for UTI    Marianna Payment 12/07/2013, 1:24 PM [phone]

## 2013-12-08 LAB — CBC
HCT: 34.2 % — ABNORMAL LOW (ref 39.0–52.0)
HEMOGLOBIN: 11.3 g/dL — AB (ref 13.0–17.0)
MCH: 30.4 pg (ref 26.0–34.0)
MCHC: 33 g/dL (ref 30.0–36.0)
MCV: 91.9 fL (ref 78.0–100.0)
Platelets: 203 10*3/uL (ref 150–400)
RBC: 3.72 MIL/uL — AB (ref 4.22–5.81)
RDW: 14 % (ref 11.5–15.5)
WBC: 12.8 10*3/uL — ABNORMAL HIGH (ref 4.0–10.5)

## 2013-12-08 NOTE — Progress Notes (Signed)
Physical Therapy Treatment Patient Details Name: SHOUA RESSLER MRN: [id number] DOB: Apr 04, 1951 Today's Date: 12/08/2013 Time: 2683-4196 PT Time Calculation (min): 23 min  PT Assessment / Plan / Recommendation  History of Present Illness 63 y.o. male admitted to [facility] on 12/05/13 s/p elective L TKA.  The pt is WBAT in [place].     PT Comments   Pt is POD #3 s/p L TKA and is moving much better today.  He is only requiring one person assist to get walking and up OOB.  He continues to be a SNF placement candidate due to the fact that he lives alone and cannot yet manage on his own safely without risk of falling or injuring himself.    Follow Up Recommendations  SNF     Does the patient have the potential to tolerate intense rehabilitation    NA  Barriers to Discharge   Pt lives alone      Equipment Recommendations  Rolling walker with 5" wheels;3in1 (PT)    Recommendations for Other Services   NA  Frequency 7X/week   Progress towards PT Goals Progress towards PT goals: Progressing toward goals  Plan Current plan remains appropriate    Precautions / Restrictions Precautions Precautions: Knee Required Braces or Orthoses: Knee Immobilizer - Left Knee Immobilizer - Left: On when out of bed or walking Restrictions LLE Weight Bearing: Weight bearing as tolerated   Pertinent Vitals/Pain See vitals flow sheet.    Mobility  Bed Mobility Overal bed mobility: Needs Assistance Bed Mobility: Supine to Sit Supine to sit: Min assist General bed mobility comments: Min assist to help progress his left leg over the side of the bed.  Transfers Overall transfer level: Needs assistance Equipment used: Rolling walker (2 wheeled) Transfers: Sit to/from Stand Sit to Stand: Min assist General transfer comment: Min assist to support trunk for balance and stabilize RW during transitions.  Verbal cues for safe hand placement.  Ambulation/Gait Ambulation/Gait assistance: Min guard Ambulation Distance  (Feet): 150 Feet Assistive device: Rolling walker (2 wheeled) Gait Pattern/deviations: Step-through pattern;Antalgic;Trunk flexed General Gait Details: Verbal cues for safe use of RW, upright posture and correct leg sequencing.     Exercises Total Joint Exercises Ankle Circles/Pumps: AROM;Both;10 reps;Seated Quad Sets: AROM;Left;10 reps;Seated Heel Slides: AROM;Left;10 reps;Seated     PT Goals (current goals can now be found in the care plan section) Acute Rehab PT Goals Patient Stated Goal: rehab then home independently  Visit Information  Last PT Received On: 12/08/13 Assistance Needed: +1 History of Present Illness: 63 y.o. male admitted to [facility] on 12/05/13 s/p elective L TKA.  The pt is WBAT in [place].      Subjective Data  Subjective: Pt reports back pain and right thigh numbness better today.  He slept well last night.  Patient Stated Goal: rehab then home independently   Cognition  Cognition Arousal/Alertness: Awake/alert Behavior During Therapy: WFL for tasks assessed/performed Overall Cognitive Status: Within Functional Limits for tasks assessed    Balance  Balance Overall balance assessment: Needs assistance Sitting-balance support: No upper extremity supported;Feet supported Sitting balance-Leahy Scale: Good Standing balance support: Bilateral upper extremity supported Standing balance-Leahy Scale: Fair  End of Session PT - End of Session Equipment Utilized During Treatment: Gait belt;Left knee immobilizer Activity Tolerance: Patient limited by fatigue Patient left: in chair;with call bell/phone within reach Nurse Communication: Mobility status     Wells Guiles B. Becker, [place], DPT [phone]   12/08/2013, 12:17 PM

## 2013-12-08 NOTE — Progress Notes (Signed)
Orthopedic Tech Progress Note Patient Details:  Jonathan Forbes 07/15/1951 [id number]  Patient ID: [name], male   DOB: 1951-02-02, 63 y.o.   MRN: [id number] Placed pt's lle in cpm @ 0-60 degrees @ 1450  Hildred Priest 12/08/2013, 2:42 PM

## 2013-12-08 NOTE — Progress Notes (Signed)
PT PROGRESS NOTE 12/08/13 1442  PT Visit Information  Last PT Received On 12/08/13  Assistance Needed +1  History of Present Illness 63 y.o. male admitted to [facility] on 12/05/13 s/p elective L TKA.  The pt is WBAT in [place].    PT Time Calculation  PT Start Time 1341  PT Stop Time 1356  PT Time Calculation (min) 15 min  Subjective Data  Subjective Pt reports back spasms second half of walking.   Patient Stated Goal rehab then home independently  Precautions  Precautions Knee  Required Braces or Orthoses Knee Immobilizer - Left  Knee Immobilizer - Left On when out of bed or walking  Restrictions  LLE Weight Bearing WBAT  Cognition  Arousal/Alertness Awake/alert  Behavior During Therapy WFL for tasks assessed/performed  Overall Cognitive Status Within Functional Limits for tasks assessed  Transfers  Overall transfer level Needs assistance  Equipment used Rolling walker (2 wheeled)  Transfers Sit to/from Stand  Sit to Stand Min assist  General transfer comment Min assist to support trunk for balance and stabilize RW during transitions.  Verbal cues for safe hand placement.   Ambulation/Gait  Ambulation/Gait assistance Min guard  Ambulation Distance (Feet) 100 Feet  Assistive device Rolling walker (2 wheeled)  Gait Pattern/deviations Step-through pattern;Shuffle;Antalgic;Trunk flexed  Gait velocity decreased  Gait velocity interpretation <1.8 ft/sec, indicative of risk for recurrent falls  General Gait Details Verbal cues for safe use of RW, upright posture and correct leg sequencing.   PT - End of Session  Equipment Utilized During Treatment Gait belt;Left knee immobilizer  Activity Tolerance Patient limited by fatigue;Other (comment) (by low back muscle spasms)  Patient left in chair;with call bell/phone within reach  PT - Assessment/Plan  PT Plan Current plan remains appropriate  PT Frequency 7X/week  Follow Up Recommendations SNF  PT equipment Rolling walker with 5" wheels;3in1  (PT)  PT Goal Progression  Progress towards PT goals Progressing toward goals  PT General Charges  $$ ACUTE PT VISIT 1 Procedure  PT Treatments  $Gait Training 8-22 mins  Cresencia Asmus B. [place], [place], DPT [phone]

## 2013-12-08 NOTE — Discharge Summary (Signed)
Physician Discharge Summary  Patient ID: Jonathan Forbes MRN: [id number] DOB/AGE: 1951/04/08 63 y.o.  Admit date: 12/05/2013 Discharge date: 12/08/2013  Admission Diagnoses: Osteoarthritis left knee  Discharge Diagnoses:  Principal Problem:   Osteoarthritis of left knee Active Problems:   Total knee replacement status   Discharged Condition: stable  Hospital Course: Patient's hospital course was essentially unremarkable. Patient underwent total knee arthroplasty. Postoperatively he progressed well and was not able to be independent for discharge to home and is discharged to skilled nursing.  Consults: None  Significant Diagnostic Studies: labs: Routine labs  Treatments: surgery: See operative note  Discharge Exam: Blood pressure 154/73, pulse 95, temperature 98.6 F (37 C), temperature source Oral, resp. rate 18, SpO2 90.00%. Incision/Wound: clean and dry  Disposition: Final discharge disposition not confirmed  Discharge Orders   Future Orders Complete By Expires   Call MD / Call 911  As directed    Comments:     If you experience chest pain or shortness of breath, CALL 911 and be transported to the hospital emergency room.  If you develope a fever above 101 F, pus (white drainage) or increased drainage or redness at the wound, or calf pain, call your surgeon's office.   Constipation Prevention  As directed    Comments:     Drink plenty of fluids.  Prune juice may be helpful.  You may use a stool softener, such as Colace (over the counter) 100 mg twice a day.  Use MiraLax (over the counter) for constipation as needed.   Diet - low sodium heart healthy  As directed    Increase activity slowly as tolerated  As directed    Weight bearing as tolerated  As directed    Questions:     Laterality:     Extremity:     Weight bearing as tolerated  As directed    Questions:     Laterality:     Extremity:         Medication List         amLODipine 10 MG tablet  Commonly  known as:  NORVASC  Take 10 mg by mouth daily.     aspirin EC 325 MG tablet  Take 1 tablet (325 mg total) by mouth 2 (two) times daily.     aspirin EC 325 MG tablet  Take 1 tablet (325 mg total) by mouth 2 (two) times daily.     carvedilol 25 MG tablet  Commonly known as:  COREG  Take 25 mg by mouth 2 (two) times daily with a meal.     furosemide 40 MG tablet  Commonly known as:  LASIX  Take 40 mg by mouth daily.     lisinopril 40 MG tablet  Commonly known as:  PRINIVIL,ZESTRIL  Take 40 mg by mouth daily.     oxyCODONE 5 MG immediate release tablet  Commonly known as:  Oxy IR/ROXICODONE  Take 1-3 tablets (5-15 mg total) by mouth every 4 (four) hours as needed.     oxyCODONE 5 MG immediate release tablet  Commonly known as:  Oxy IR/ROXICODONE  Take 1-3 tablets (5-15 mg total) by mouth every 4 (four) hours as needed.     traMADol 50 MG tablet  Commonly known as:  ULTRAM  Take 50 mg by mouth 4 (four) times daily.     traZODone 100 MG tablet  Commonly known as:  DESYREL  Take 100 mg by mouth at bedtime.     VIIBRYD 40 MG  Tabs  Generic drug:  Vilazodone HCl  Take 40 mg by mouth daily.           Follow-up Information   Follow up with Marianna Payment, MD In 2 weeks.   Specialty:  Orthopedic Surgery   Contact information:   [address] [address] [phone]       Signed: Newt Minion 12/08/2013, 8:09 AM

## 2013-12-09 NOTE — Progress Notes (Signed)
Physical Therapy Treatment Patient Details Name: Jonathan Forbes MRN: [id number] DOB: 1950-12-26 Today's Date: 12/09/2013 Time: 2993-7169 PT Time Calculation (min): 23 min  PT Assessment / Plan / Recommendation  History of Present Illness 63 y.o. male admitted to [facility] on 12/05/13 s/p elective L TKA.  The pt is WBAT in [place].     PT Comments   Making progress with mobility and amb; Decr tol of ankle prop -- Pt educated in need to prop ankle for optimal knee extension  Showing good L knee stability in stance; Likely won't need KI for much longer  Follow Up Recommendations  SNF for postacute rehabilitation;   Worth considering: does pt's BMI bolster his case for CIR for Rehab postacutely? His current insurance may pay for a stay at a SNF, but not for therapies while at SNF, which is important for recovery and optimal outcomes (Have requested CIR Screen)     Does the patient have the potential to tolerate intense rehabilitation     Barriers to Discharge        Equipment Recommendations  Rolling walker with 5" wheels;3in1 (PT)    Recommendations for Other Services OT consult (Reconsult)  Frequency 7X/week   Progress towards PT Goals Progress towards PT goals: Progressing toward goals  Plan Current plan remains appropriate    Precautions / Restrictions Precautions Precautions: Knee Required Braces or Orthoses: Knee Immobilizer - Left Knee Immobilizer - Left: On when out of bed or walking Restrictions LLE Weight Bearing: Weight bearing as tolerated   Pertinent Vitals/Pain 6/10 L knee pain pre and post amb; was premedicated patient repositioned for comfort and optimal knee ext    Mobility  Bed Mobility Overal bed mobility: Needs Assistance Bed Mobility: Supine to Sit Supine to sit: Min guard General bed mobility comments: Min assist to help progress his left leg over the side of the bed.  Transfers Overall transfer level: Needs assistance Equipment used: Rolling walker (2  wheeled) Transfers: Sit to/from Stand Sit to Stand: Min guard General transfer comment: Cues for hand placement and technique, and to preposition LLE for comfort with transitions Ambulation/Gait Ambulation/Gait assistance: Min guard Ambulation Distance (Feet): 100 Feet Assistive device: Rolling walker (2 wheeled) Gait Pattern/deviations: Step-through pattern Gait velocity: decreased General Gait Details: Verbal cues for safe use of RW, upright posture and correct leg sequencing.     Exercises     PT Diagnosis:    PT Problem List:   PT Treatment Interventions:     PT Goals (current goals can now be found in the care plan section) Acute Rehab PT Goals Patient Stated Goal: rehab then home independently  Visit Information  Last PT Received On: 12/09/13 Assistance Needed: +1 History of Present Illness: 63 y.o. male admitted to [facility] on 12/05/13 s/p elective L TKA.  The pt is WBAT in [place].      Subjective Data  Subjective: Agreeable to amb; reports less tolerance of the ankle prop to promote knee extension; Emphasized the importance of ankle prop for optimal knee extension Patient Stated Goal: rehab then home independently   Cognition  Cognition Arousal/Alertness: Awake/alert Behavior During Therapy: WFL for tasks assessed/performed Overall Cognitive Status: Within Functional Limits for tasks assessed    Balance     End of Session PT - End of Session Equipment Utilized During Treatment: Left knee immobilizer Activity Tolerance: Patient tolerated treatment well Patient left: in chair;with call bell/phone within reach Nurse Communication: Mobility status   GP     Jonathan Forbes  [place], [place]  12/09/2013, 9:57 AM

## 2013-12-09 NOTE — Progress Notes (Signed)
Patient ID: Jonathan Forbes, male   DOB: Mar 24, 1951, 63 y.o.   MRN: [id number] Patient's anticipate discharge to skilled nursing yesterday did not occur. Plan for discharge to skilled nursing on Monday. Patient has no complaints.

## 2013-12-09 NOTE — Progress Notes (Signed)
Physical Therapy Treatment Patient Details Name: Jonathan Forbes MRN: [id number] DOB: 1951/03/05 Today's Date: 12/09/2013 Time: 6314-9702 PT Time Calculation (min): 29 min  PT Assessment / Plan / Recommendation  History of Present Illness 63 y.o. male admitted to [facility] on 12/05/13 s/p elective L TKA.  The pt is WBAT in [place].     PT Comments   Showing good knee stability in stance  Continue to recommend therapy services in postacute setting   Follow Up Recommendations  SNF;CIR (Will need therapy services for best outcome)     Does the patient have the potential to tolerate intense rehabilitation     Barriers to Discharge        Equipment Recommendations  Rolling walker with 5" wheels;3in1 (PT)    Recommendations for Other Services OT consult (Reconsult)  Frequency 7X/week   Progress towards PT Goals Progress towards PT goals: Progressing toward goals  Plan Will recommend CIR over SNF, so that pt can have therapy services   Precautions / Restrictions Precautions Precautions: Knee Required Braces or Orthoses: Knee Immobilizer - Left Knee Immobilizer - Left: On when out of bed or walking Restrictions LLE Weight Bearing: Weight bearing as tolerated   Pertinent Vitals/Pain 7/10 pain post therex and progressive amb patient repositioned for comfort in CPM    Mobility  Bed Mobility Overal bed mobility: Needs Assistance Bed Mobility: Supine to Sit Supine to sit: Min guard General bed mobility comments: Min assist to help progress his left leg over the side of the bed.  Transfers Overall transfer level: Needs assistance Equipment used: Rolling walker (2 wheeled) Transfers: Sit to/from Stand Sit to Stand: Min guard General transfer comment: Cues for hand placement and technique, and to preposition LLE for comfort with transitions Ambulation/Gait Ambulation/Gait assistance: Min guard Ambulation Distance (Feet): 80 Feet Assistive device: Rolling walker (2 wheeled) Gait  Pattern/deviations: Step-to pattern Gait velocity: decreased General Gait Details: Verbal cues for safe use of RW, upright posture and correct leg sequencing. Verbal and tactile cueing also for L quad activation in stance for stability; Nice stable L knee in stance without external support    Exercises Total Joint Exercises Ankle Circles/Pumps: AROM;Both Quad Sets: AROM;Left;10 reps Short Arc Quad: Left;10 reps;AAROM Heel Slides: AAROM;Left;10 reps;Supine Hip ABduction/ADduction: AAROM;Left;10 reps Straight Leg Raises: AAROM;Left;10 reps   PT Diagnosis:    PT Problem List:   PT Treatment Interventions:     PT Goals (current goals can now be found in the care plan section) Acute Rehab PT Goals Patient Stated Goal: rehab then home independently PT Goal Formulation: With patient Time For Goal Achievement: 12/13/13 Potential to Achieve Goals: Good  Visit Information  Last PT Received On: 12/09/13 Assistance Needed: +1 History of Present Illness: 63 y.o. male admitted to [facility] on 12/05/13 s/p elective L TKA.  The pt is WBAT in [place].      Subjective Data  Subjective: More pain post therex, but very willing to work Patient Stated Goal: rehab then home independently   Cognition  Cognition Arousal/Alertness: Awake/alert Behavior During Therapy: WFL for tasks assessed/performed Overall Cognitive Status: Within Functional Limits for tasks assessed    Balance     End of Session PT - End of Session Activity Tolerance: Patient tolerated treatment well Patient left: with call bell/phone within reach;in bed;in CPM Nurse Communication: Mobility status CPM Left Knee CPM Left Knee: Off   GP     Roney Marion [place], [place]  12/09/2013, 4:11 PM

## 2013-12-10 DIAGNOSIS — M1710 Unilateral primary osteoarthritis, unspecified knee: Secondary | ICD-10-CM

## 2013-12-10 DIAGNOSIS — Z96659 Presence of unspecified artificial knee joint: Secondary | ICD-10-CM

## 2013-12-10 NOTE — Progress Notes (Signed)
Rehab Admissions Coordinator Note:  Patient was screened by Cleatrice Burke for appropriateness for an Inpatient Acute Rehab Consult.  At this time, we are recommending Inpatient Rehab consult. I have discussed with RN CM, Ricki Miller.  Cleatrice Burke 12/10/2013, 10:17 AM  I can be reached at [phone].

## 2013-12-10 NOTE — Progress Notes (Signed)
Physical Therapy Treatment Patient Details Name: Jonathan Forbes MRN: [id number] DOB: September 12, 1951 Today's Date: 12/10/2013 Time: 2440-1027 PT Time Calculation (min): 27 min  PT Assessment / Plan / Recommendation  History of Present Illness 63 y.o. male admitted to [facility] on 12/05/13 s/p elective L TKA.  The pt is WBAT in [place].     PT Comments   Overall, L knee continuse to be stable in stance, and pt is showing good control against gravity, and showing improving knee flexion; This session, pt is slow to answer questions, requires persuasion to participate  Of note, pt with decr o2 sats on Room Air during activity; Encouraged Incentive spirometry, and restarted supplemental O2  Continue to recommend comprehensive inpatient rehab (CIR) for post-acute therapy needs.   Follow Up Recommendations  CIR (Will need therapy services for best outcome)     Does the patient have the potential to tolerate intense rehabilitation     Barriers to Discharge        Equipment Recommendations  Rolling walker with 5" wheels;3in1 (PT)    Recommendations for Other Services OT consult (Reconsult)  Frequency 7X/week   Progress towards PT Goals Progress towards PT goals: Not progressing toward goals - comment (Limited today by hypoxia, and pt reproting general malaise -- of note, temp is normal)  Plan Discharge plan needs to be updated    Precautions / Restrictions Precautions Precautions: Knee Required Braces or Orthoses: Knee Immobilizer - Left (Nice, stable knee in stance) Knee Immobilizer - Left: On when out of bed or walking Restrictions LLE Weight Bearing: Weight bearing as tolerated   Pertinent Vitals/Pain 10+/10 L knee pain; RN provided medication to assist with pain control patient repositioned for optimal knee extension  O2 sats ranged from 86% to 92% on Room Air Incr to greater than or equal to 92% on 2 Liter supplemental O2      Mobility  Transfers Overall transfer level: Needs  assistance Equipment used: Rolling walker (2 wheeled) Transfers: Sit to/from Stand Sit to Stand: Min guard General transfer comment: Cues for hand placement; Pt repors needing more assist today, second person present, but pt essentially  needed mod assist; bed elevated Ambulation/Gait Ambulation/Gait assistance: Min guard;+2 safety/equipment;Min assist Ambulation Distance (Feet): 50 Feet Assistive device: Rolling walker (2 wheeled) Gait Pattern/deviations: Step-through pattern (emerging) Gait velocity: decreased General Gait Details: Verbal cues for safe use of RW, upright posture and correct leg sequencing. Verbal and tactile cueing also for L quad activation in stance for stability; Nice stable L knee in stance without external support; Decr activity tolerance today compared to last session    Exercises Total Joint Exercises Long Arc Quad: AROM;AAROM;Left;5 reps Knee Flexion: AROM;Left;5 reps;Seated   PT Diagnosis:    PT Problem List:   PT Treatment Interventions:     PT Goals (current goals can now be found in the care plan section) Acute Rehab PT Goals Patient Stated Goal: rehab then home independently PT Goal Formulation: With patient Time For Goal Achievement: 12/13/13 Potential to Achieve Goals: Good  Visit Information  Last PT Received On: 12/10/13 Assistance Needed: +1 History of Present Illness: 63 y.o. male admitted to [facility] on 12/05/13 s/p elective L TKA.  The pt is WBAT in [place].      Subjective Data  Subjective: Reports is having a "bad day" Patient Stated Goal: rehab then home independently   Cognition  Cognition Arousal/Alertness: Awake/alert Behavior During Therapy: WFL for tasks assessed/performed Overall Cognitive Status: Within Functional Limits for tasks assessed  Balance     End of Session PT - End of Session Activity Tolerance: Patient limited by fatigue;Patient limited by pain;Other (comment) (decr O2 sats on Room Air) Patient left: with call  bell/phone within reach;in chair;with nursing/sitter in room Nurse Communication: Mobility status   GP     Roney Marion [facility] [place], [place]  12/10/2013, 11:12 AM

## 2013-12-10 NOTE — Progress Notes (Signed)
   Subjective:  Patient reports pain as moderate.  No events.   Objective:   VITALS:   Filed Vitals:   12/08/13 2205 12/09/13 0655 12/09/13 1240 12/10/13 0444  BP: 141/69 135/83 129/72 154/73  Pulse: 77 84 86 90  Temp: 98.1 F (36.7 C) 99 F (37.2 C) 98.2 F (36.8 C) 99.5 F (37.5 C)  TempSrc: Oral Oral Oral Oral  Resp: 20  18 18   SpO2: 97% 93% 95% 94%    Neurologically intact Neurovascular intact Sensation intact distally Intact pulses distally Dorsiflexion/Plantar flexion intact Incision: dressing C/D/I and no drainage No cellulitis present Compartment soft   Lab Results  Component Value Date   WBC 12.8* 12/08/2013   HGB 11.3* 12/08/2013   HCT 34.2* 12/08/2013   MCV 91.9 12/08/2013   PLT 203 12/08/2013     Assessment/Plan: 5 Days Post-Op   Problem List Items Addressed This Visit     Musculoskeletal and Integument   *Osteoarthritis of left knee   Relevant Medications      traMADol (ULTRAM) 50 MG tablet      traMADol (ULTRAM) tablet 50 mg      aspirin EC tablet 325 mg      acetaminophen (TYLENOL) tablet 1,000 mg (Completed)      OxyCODONE (OXYCONTIN) 12 hr tablet 15 mg      acetaminophen (TYLENOL) tablet 650 mg      acetaminophen (TYLENOL) suppository 650 mg      oxyCODONE (Oxy IR/ROXICODONE) immediate release tablet 5-10 mg      morphine 2 MG/ML injection 2 mg      methocarbamol (ROBAXIN) tablet 500 mg      oxyCODONE (Oxy IR/ROXICODONE) immediate release tablet 5 mg (Completed)      oxyCODONE (ROXICODONE) 5 MG/5ML solution 5 mg (Completed)      HYDROmorphone (DILAUDID) 1 MG/ML injection (Completed)      oxyCODONE (Oxy IR/ROXICODONE) 5 MG immediate release tablet (Completed)      ketorolac (TORADOL) 30 MG/ML injection 30 mg (Completed)      aspirin EC tablet      oxyCODONE (Oxy IR/ROXICODONE) immediate release tablet      aspirin EC tablet      oxyCODONE (Oxy IR/ROXICODONE) immediate release tablet   Other Relevant Orders      Weight bearing as  tolerated      Weight bearing as tolerated     Other   Total knee replacement status - Primary   Relevant Orders      Weight bearing as tolerated      Call MD / Call 911      Diet - low sodium heart healthy      Constipation Prevention      Increase activity slowly as tolerated      Expected postop acute blood loss anemia - will monitor for symptoms Up with PT/OT DVT ppx - SCDs, ambulation, asa WBAT left lower extremity CPM Knee extension protocol Pain control Discharge planning - SNF vs. CIR Rx in chart Rocephin IV q24h x 7 days for UTI    Marianna Payment 12/10/2013, 9:54 AM [phone]

## 2013-12-10 NOTE — Evaluation (Signed)
Occupational Therapy Evaluation Patient Details Name: Jonathan Forbes MRN: [id number] DOB: [birth year] Today's Date: 12/10/2013 Time: 9678-9381 OT Time Calculation (min): 24 min  OT Assessment / Plan / Recommendation History of present illness 63 y.o. male admitted to [facility] on 12/05/13 s/p elective L TKA.  The pt is WBAT in [place].     Clinical Impression   This 63 yo male admitted and underwent above presents to acute OT with decreased AROM LLE, increased pain LLE, increased pain in lower back with siatica pain now his right thigh, obesity all affecting pt's ability to take care of himself at home. Will benefit from acute OT with follow up on CIR. (original plan was SNF, so OT eval not completed until today due to D/C plan recommendation changed to CIR).    OT Assessment  Patient needs continued OT Services    Follow Up Recommendations  CIR    Barriers to Discharge Decreased caregiver support    Equipment Recommendations  3 in 1 bedside comode (bariatric)       Frequency  Min 2X/week    Precautions / Restrictions Precautions Precautions: Knee Required Braces or Orthoses: Knee Immobilizer - Left (Nice, stable knee in stance) Knee Immobilizer - Left: On when out of bed or walking Restrictions Weight Bearing Restrictions: Yes LLE Weight Bearing: Weight bearing as tolerated   Pertinent Vitals/Pain 5/10 mid,lower back and down right thigh, heat applied     ADL  Eating/Feeding: Independent Where Assessed - Eating/Feeding: Chair Grooming: Min guard Where Assessed - Grooming: Unsupported standing Upper Body Bathing: Set up Where Assessed - Upper Body Bathing: Unsupported sitting Lower Body Bathing: Minimal assistance (with AE) Where Assessed - Lower Body Bathing: Unsupported sit to stand Upper Body Dressing: Set up Where Assessed - Upper Body Dressing: Unsupported sitting Lower Body Dressing: Minimal assistance (with AE) Where Assessed - Lower Body Dressing: Unsupported sit to  stand Toilet Transfer: Min Psychiatric nurse Method: Sit to Loss adjuster, chartered: Comfort height toilet;Grab bars Toileting - Water quality scientist and Hygiene: Independent Where Assessed - Best boy and Hygiene: Sit on 3-in-1 or toilet Equipment Used: Rolling walker Transfers/Ambulation Related to ADLs: min guard A for all with DME    OT Diagnosis: Generalized weakness;Acute pain  OT Problem List: Pain;Impaired balance (sitting and/or standing);Obesity;Decreased knowledge of use of DME or AE OT Treatment Interventions: Self-care/ADL training;Patient/family education;DME and/or AE instruction;Therapeutic activities   OT Goals(Current goals can be found in the care plan section) Acute Rehab OT Goals Patient Stated Goal: rehab then home independently OT Goal Formulation: With patient Time For Goal Achievement: 12/17/13 Potential to Achieve Goals: Good  Visit Information  Last OT Received On: 12/10/13 Assistance Needed: +1 History of Present Illness: 63 y.o. male admitted to [facility] on 12/05/13 s/p elective L TKA.  The pt is WBAT in [place].         Prior Functioning     Home Living Family/patient expects to be discharged to:: Inpatient rehab Living Arrangements: Alone Type of Home: House Prior Function Level of Independence: Independent Communication Communication: No difficulties Dominant Hand: Right         Vision/Perception Vision - History Patient Visual Report: No change from baseline   Cognition  Cognition Arousal/Alertness: Awake/alert Behavior During Therapy: WFL for tasks assessed/performed Overall Cognitive Status: Within Functional Limits for tasks assessed    Extremity/Trunk Assessment Upper Extremity Assessment Upper Extremity Assessment: Overall WFL for tasks assessed     Mobility Bed Mobility Overal bed mobility: Needs Assistance Bed  Mobility: Sit to Supine Sit to supine: Supervision Transfers Overall transfer  level: Needs assistance Equipment used: Rolling walker (2 wheeled) Transfers: Sit to/from Stand Sit to Stand: Min guard General transfer comment: Cues for hand placement           End of Session OT - End of Session Equipment Utilized During Treatment: Rolling walker Activity Tolerance: Patient tolerated treatment well Patient left: in bed;with call bell/phone within reach Nurse Communication:  (wants to be put in CPM, heat applied to lower back)       Almon Register [phone] 12/10/2013, 1:00 PM

## 2013-12-10 NOTE — Progress Notes (Signed)
Physical Therapy Treatment Patient Details Name: Jonathan Forbes MRN: [id number] DOB: Nov 25, 1950 Today's Date: 12/10/2013 Time: 7846-9629 PT Time Calculation (min): 35 min  PT Assessment / Plan / Recommendation  History of Present Illness 63 y.o. male admitted to [facility] on 12/05/13 s/p elective L TKA.  The pt is WBAT in [place].     PT Comments   Improved participation a dn activity tolerance compared to last session; O2 sats still low (90-93%) on room air, but better than this am  Continue to recommend comprehensive inpatient rehab (CIR) for post-acute therapy needs.   Follow Up Recommendations  CIR     Does the patient have the potential to tolerate intense rehabilitation     Barriers to Discharge        Equipment Recommendations  Rolling Forbes with 5" wheels;3in1 (PT)    Recommendations for Other Services    Frequency 7X/week   Progress towards PT Goals Progress towards PT goals: Progressing toward goals  Plan Current plan remains appropriate    Precautions / Restrictions Precautions Precautions: Knee Required Braces or Orthoses: Knee Immobilizer - Left (Knee is stable in stance, no instances of buckling) Knee Immobilizer - Left: On when out of bed or walking Restrictions Weight Bearing Restrictions: Yes LLE Weight Bearing: Weight bearing as tolerated   Pertinent Vitals/Pain Back pain 6/10 Lknee pain 7/10 patient repositioned for comfort in cpm    Mobility  Bed Mobility Overal bed mobility: Needs Assistance (With use of overhead trapeze) Bed Mobility: Supine to Sit Supine to sit: Supervision Sit to supine: Supervision General bed mobility comments: Pt quite dependent on momentum to come to sit, and noted some valsalva, but did not require phayical assist Transfers Overall transfer level: Needs assistance Equipment used: Rolling Forbes (2 wheeled) Transfers: Sit to/from Stand Sit to Stand: Min assist General transfer comment: Cues for hand  placement Ambulation/Gait Ambulation/Gait assistance: Min guard Ambulation Distance (Feet): 100 Feet Assistive device: Rolling Forbes (2 wheeled) Gait Pattern/deviations: Step-through pattern Gait velocity: decreased General Gait Details: Verbal cues for safe use of RW, upright posture and correct leg sequencing. Verbal and tactile cueing also for L quad activation in stance for stability; Nice stable L knee in stance without external support; Improved activity tolerance this afternoon    Exercises Total Joint Exercises Quad Sets: AROM;Left;20 reps Short Arc Quad: Left;10 reps;AAROM Heel Slides: AAROM;Left;10 reps;Supine Straight Leg Raises: AAROM;Left;10 reps Goniometric ROM: 0-85   PT Diagnosis:    PT Problem List:   PT Treatment Interventions:     PT Goals (current goals can now be found in the care plan section) Acute Rehab PT Goals Patient Stated Goal: rehab then home independently PT Goal Formulation: With patient Time For Goal Achievement: 12/13/13 Potential to Achieve Goals: Good  Visit Information  Last PT Received On: 12/10/13 Assistance Needed: +1 History of Present Illness: 63 y.o. male admitted to [facility] on 12/05/13 s/p elective L TKA.  The pt is WBAT in [place].      Subjective Data  Subjective: Reports he needs to use the rest room, and feels better since session with physical therapy this morning. Patient Stated Goal: rehab then home independently   Cognition  Cognition Arousal/Alertness: Awake/alert Behavior During Therapy: WFL for tasks assessed/performed Overall Cognitive Status: Within Functional Limits for tasks assessed    Balance     End of Session PT - End of Session Activity Tolerance: Patient tolerated treatment well;Patient limited by pain (Back pain is affecting activity tolerance) Patient left: in bed;in CPM;with  call bell/phone within reach Nurse Communication: Mobility status   GP     R[facility] [place], [place]  12/10/2013, 4:14 PM

## 2013-12-10 NOTE — Consult Note (Signed)
Physical Medicine and Rehabilitation Consult Reason for Consult: OA left knee Referring Physician: Dr. Roda ShuttersXu   HPI: Jonathan Forbes is a 63 y.o. male with history of HTN, morbid obesity, OSA, OA left knee with failure of conservative therapy. Patient elected to undergo L-TKR on 12/05/13 by Dr. Roda ShuttersXu. Post op WBAT and on ASA bid for DVT prophylaxis. Started on IV rocephin for leucocytosis and concerns of UTI. ABLA noted with Hgb down to 12.8. Therapies ongoing and MD/rehab team recommending CIR.    Review of Systems  HENT: Negative for hearing loss.   Eyes: Negative for blurred vision and double vision.  Respiratory: Negative for cough and shortness of breath.   Cardiovascular: Negative for chest pain and palpitations.  Gastrointestinal: Negative for heartburn, nausea, abdominal pain and constipation.  Genitourinary: Negative for dysuria and urgency.  Musculoskeletal: Positive for back pain and joint pain (left knee pain).  Neurological: Positive for sensory change (burning sensation right groin/right thigh. ) and weakness. Negative for headaches.   Past Medical History  Diagnosis Date  . Hypertension   . OA (osteoarthritis) of knee     Hx: of B/L knees  . Depression   . Cancer     Hx: of skin cancer  . Glaucoma     Hx; of beginning stages  . Eczema     Hx; of  . Anxiety   . Sleep apnea   . Mitral valve regurgitation     Hx; of slight   Past Surgical History  Procedure Laterality Date  . Tumor excision      skin cancer of right side of back  . Cataract extraction w/ intraocular lens  implant, bilateral      Hx; of  . Colonoscopy      Hx; of  . Tonsillectomy    . Total knee arthroplasty Left 12/05/2013    Procedure: LEFT TOTAL KNEE ARTHROPLASTY;  Surgeon: Cheral AlmasNaiping Michael Xu, MD;  [facility];  Service: Orthopedics;  Laterality: Left;   Family History  Problem Relation Age of Onset  . Heart disease Father   . Hypertension Father    Social History:  Lives alone.  Independent PTA but limited due to bilateral knee pain. Has meals on wheels/groceris on wheels. Has a son in college who check in intermittently. [employer] members help with transportation. reports that he has never smoked. He has never used smokeless tobacco. He reports that he drinks alcohol. He reports that he does not use illicit drugs.  Allergies: No Known Allergies  Medications Prior to Admission  Medication Sig Dispense Refill  . amLODipine (NORVASC) 10 MG tablet Take 10 mg by mouth daily.      . carvedilol (COREG) 25 MG tablet Take 25 mg by mouth 2 (two) times daily with a meal.      . furosemide (LASIX) 40 MG tablet Take 40 mg by mouth daily.      Marland Kitchen. lisinopril (PRINIVIL,ZESTRIL) 40 MG tablet Take 40 mg by mouth daily.      . traMADol (ULTRAM) 50 MG tablet Take 50 mg by mouth 4 (four) times daily.      . traZODone (DESYREL) 100 MG tablet Take 100 mg by mouth at bedtime.      . Vilazodone HCl (VIIBRYD) 40 MG TABS Take 40 mg by mouth daily.        Home: Home Living Family/patient expects to be discharged to:: Skilled nursing facility Living Arrangements: Alone  Functional History:   Functional Status:  Mobility:     Ambulation/Gait Ambulation Distance (Feet): 80 Feet Gait velocity: decreased General Gait Details: Verbal cues for safe use of RW, upright posture and correct leg sequencing. Verbal and tactile cueing also for L quad activation in stance for stability; Nice stable L knee in stance without external support    ADL:    Cognition: Cognition Overall Cognitive Status: Within Functional Limits for tasks assessed Orientation Level: Oriented X4 Cognition Arousal/Alertness: Awake/alert Behavior During Therapy: WFL for tasks assessed/performed Overall Cognitive Status: Within Functional Limits for tasks assessed  Blood pressure 132/65, pulse 90, temperature 99.5 F (37.5 C), temperature source Oral, resp. rate 18, SpO2 94.00%. Physical Exam  Nursing note and vitals  reviewed. Constitutional: He is oriented to person, place, and time. He appears well-developed and well-nourished. Nasal cannula in place.  Morbidly obese male up in chair. Restless and fidgeting due to lower back pain.   HENT:  Head: Normocephalic and atraumatic.  Eyes: Conjunctivae are normal. Pupils are equal, round, and reactive to light.  Neck: Normal range of motion. Neck supple. No tracheal deviation present. No thyromegaly present.  Cardiovascular: Normal rate and regular rhythm.   No murmur heard. Respiratory: Effort normal and breath sounds normal. He has no wheezes.  A little dyspneic  GI: Soft. Bowel sounds are normal. He exhibits no distension. There is no tenderness.  Musculoskeletal:  Left knee with surgical dressing and moderate edema. Paraspinal tenderness lower spine.   Neurological: He is alert and oriented to person, place, and time. No cranial nerve deficit. Coordination normal.  Alert and appropriate. UE strength 5/5. RLE functional.  Has pain and numbness along lateral hip/thigh  Skin: Skin is warm and dry. Rash (diffuse rash on extremities. ) noted.  Psychiatric: He has a normal mood and affect. His behavior is normal. Thought content normal.    No results found for this or any previous visit (from the past 24 hour(s)). No results found.  Assessment/Plan: Diagnosis: left TKA due to endstage OA in morbidly obese male 1. Does the need for close, 24 hr/day medical supervision in concert with the patient's rehab needs make it unreasonable for this patient to be served in a less intensive setting? Yes 2. Co-Morbidities requiring supervision/potential complications: low back pain, hypoxia, ?right meralgia paresthetica.  3. Due to bladder management, bowel management, safety, skin/wound care, disease management, medication administration and pain management, does the patient require 24 hr/day rehab nursing? Yes 4. Does the patient require coordinated care of a physician,  rehab nurse, PT (1-2 hrs/day, 5 days/week) and OT (1-2 hrs/day, 5 days/week) to address physical and functional deficits in the context of the above medical diagnosis(es)? Yes Addressing deficits in the following areas: balance, endurance, locomotion, strength, transferring, bowel/bladder control, bathing, dressing, feeding, grooming and toileting 5. Can the patient actively participate in an intensive therapy program of at least 3 hrs of therapy per day at least 5 days per week? Yes 6. The potential for patient to make measurable gains while on inpatient rehab is excellent 7. Anticipated functional outcomes upon discharge from inpatient rehab are mod I with PT, mod i with OT, n/a with SLP. 8. Estimated rehab length of stay to reach the above functional goals is: 6-7 days 9. Does the patient have adequate social supports to accommodate these discharge functional goals? Yes 10. Anticipated D/C setting: Home 11. Anticipated post D/C treatments: [place] therapy 12. Overall Rehab/Functional Prognosis: excellent  RECOMMENDATIONS: This patient's condition is appropriate for continued rehabilitative care in the following setting:  CIR Patient has agreed to participate in recommended program. Yes Note that insurance prior authorization may be required for reimbursement for recommended care.  Comment: Needs to be independent to return home. Has been very limited to poor stamina, hypoxia, and pain in knee, low back and right hip. Rehab Admissions Coordinator to follow up.  Thanks,  Meredith Staggers, MD, Mellody Drown     12/10/2013

## 2013-12-11 ENCOUNTER — Encounter (HOSPITAL_COMMUNITY): Payer: Self-pay | Admitting: General Practice

## 2013-12-11 ENCOUNTER — Inpatient Hospital Stay (HOSPITAL_COMMUNITY)
Admission: RE | Admit: 2013-12-11 | Discharge: 2013-12-16 | DRG: 946 | Disposition: A | Discharge status: Home / Self Care | Payer: Medicaid Other | Source: Intra-hospital | Attending: Physical Medicine & Rehabilitation | Admitting: Physical Medicine & Rehabilitation

## 2013-12-11 DIAGNOSIS — F3289 Other specified depressive episodes: Secondary | ICD-10-CM | POA: Diagnosis present

## 2013-12-11 DIAGNOSIS — Z5189 Encounter for other specified aftercare: Principal | ICD-10-CM

## 2013-12-11 DIAGNOSIS — M545 Low back pain, unspecified: Secondary | ICD-10-CM | POA: Diagnosis present

## 2013-12-11 DIAGNOSIS — F411 Generalized anxiety disorder: Secondary | ICD-10-CM | POA: Diagnosis present

## 2013-12-11 DIAGNOSIS — I059 Rheumatic mitral valve disease, unspecified: Secondary | ICD-10-CM | POA: Diagnosis present

## 2013-12-11 DIAGNOSIS — Z96659 Presence of unspecified artificial knee joint: Secondary | ICD-10-CM

## 2013-12-11 DIAGNOSIS — F410 Panic disorder [episodic paroxysmal anxiety] without agoraphobia: Secondary | ICD-10-CM | POA: Diagnosis present

## 2013-12-11 DIAGNOSIS — F329 Major depressive disorder, single episode, unspecified: Secondary | ICD-10-CM | POA: Diagnosis present

## 2013-12-11 DIAGNOSIS — M1710 Unilateral primary osteoarthritis, unspecified knee: Secondary | ICD-10-CM

## 2013-12-11 DIAGNOSIS — I10 Essential (primary) hypertension: Secondary | ICD-10-CM | POA: Diagnosis present

## 2013-12-11 DIAGNOSIS — G609 Hereditary and idiopathic neuropathy, unspecified: Secondary | ICD-10-CM | POA: Diagnosis present

## 2013-12-11 DIAGNOSIS — D62 Acute posthemorrhagic anemia: Secondary | ICD-10-CM | POA: Diagnosis present

## 2013-12-11 DIAGNOSIS — G4733 Obstructive sleep apnea (adult) (pediatric): Secondary | ICD-10-CM | POA: Diagnosis present

## 2013-12-11 DIAGNOSIS — M179 Osteoarthritis of knee, unspecified: Secondary | ICD-10-CM | POA: Diagnosis present

## 2013-12-11 DIAGNOSIS — G5710 Meralgia paresthetica, unspecified lower limb: Secondary | ICD-10-CM | POA: Diagnosis present

## 2013-12-11 DIAGNOSIS — Z85828 Personal history of other malignant neoplasm of skin: Secondary | ICD-10-CM

## 2013-12-11 DIAGNOSIS — G4700 Insomnia, unspecified: Secondary | ICD-10-CM | POA: Diagnosis present

## 2013-12-11 DIAGNOSIS — G8929 Other chronic pain: Secondary | ICD-10-CM | POA: Diagnosis present

## 2013-12-11 LAB — URINALYSIS, ROUTINE W REFLEX MICROSCOPIC
Bilirubin Urine: NEGATIVE
Glucose, UA: NEGATIVE mg/dL
Hgb urine dipstick: NEGATIVE
Ketones, ur: NEGATIVE mg/dL
LEUKOCYTES UA: NEGATIVE
NITRITE: NEGATIVE
PH: 6 (ref 5.0–8.0)
Protein, ur: NEGATIVE mg/dL
SPECIFIC GRAVITY, URINE: 1.014 (ref 1.005–1.030)

## 2013-12-11 MED ORDER — ALUM & MAG HYDROXIDE-SIMETH 200-200-20 MG/5ML PO SUSP: 30.0000 mL | ORAL | Status: DC | PRN

## 2013-12-11 MED ORDER — TRAZODONE HCL 100 MG PO TABS
100.0000 mg | ORAL_TABLET | Freq: Every day | ORAL | Status: DC
Start: 2013-12-11 — End: 2013-12-16
  Administered 2013-12-11 – 2013-12-15 (×5): 100 mg via ORAL
  Filled 2013-12-11 (×6): qty 1

## 2013-12-11 MED ORDER — ENOXAPARIN SODIUM 40 MG/0.4ML ~~LOC~~ SOLN
40.0000 mg | SUBCUTANEOUS | Status: DC
  Administered 2013-12-11 – 2013-12-15 (×5): 40 mg via SUBCUTANEOUS
  Filled 2013-12-11 (×6): qty 0.4

## 2013-12-11 MED ORDER — MENTHOL 3 MG MT LOZG
1.0000 | LOZENGE | OROMUCOSAL | Status: DC | PRN
  Filled 2013-12-11: qty 9

## 2013-12-11 MED ORDER — VILAZODONE HCL 40 MG PO TABS
40.0000 mg | ORAL_TABLET | Freq: Every day | ORAL | Status: DC
  Administered 2013-12-12 – 2013-12-16 (×5): 40 mg via ORAL
  Filled 2013-12-11 (×6): qty 1

## 2013-12-11 MED ORDER — FUROSEMIDE 40 MG PO TABS
40.0000 mg | ORAL_TABLET | Freq: Every day | ORAL | Status: DC
  Administered 2013-12-12 – 2013-12-16 (×5): 40 mg via ORAL
  Filled 2013-12-11 (×6): qty 1

## 2013-12-11 MED ORDER — PROCHLORPERAZINE EDISYLATE 5 MG/ML IJ SOLN
5.0000 mg | Freq: Four times a day (QID) | INTRAMUSCULAR | Status: DC | PRN
  Filled 2013-12-11: qty 2

## 2013-12-11 MED ORDER — OXYCODONE HCL ER 15 MG PO T12A
15.0000 mg | EXTENDED_RELEASE_TABLET | Freq: Two times a day (BID) | ORAL | Status: DC
  Administered 2013-12-11 – 2013-12-16 (×10): 15 mg via ORAL
  Filled 2013-12-11 (×10): qty 1

## 2013-12-11 MED ORDER — PHENOL 1.4 % MT LIQD
1.0000 | OROMUCOSAL | Status: DC | PRN
  Filled 2013-12-11: qty 177

## 2013-12-11 MED ORDER — PROCHLORPERAZINE MALEATE 5 MG PO TABS
5.0000 mg | ORAL_TABLET | Freq: Four times a day (QID) | ORAL | Status: DC | PRN
  Filled 2013-12-11: qty 2

## 2013-12-11 MED ORDER — TRAMADOL HCL 50 MG PO TABS
50.0000 mg | ORAL_TABLET | Freq: Four times a day (QID) | ORAL | Status: DC
  Administered 2013-12-11 – 2013-12-16 (×19): 50 mg via ORAL
  Filled 2013-12-11 (×19): qty 1

## 2013-12-11 MED ORDER — CARVEDILOL 25 MG PO TABS
25.0000 mg | ORAL_TABLET | Freq: Two times a day (BID) | ORAL | Status: DC
  Administered 2013-12-11 – 2013-12-16 (×10): 25 mg via ORAL
  Filled 2013-12-11 (×12): qty 1

## 2013-12-11 MED ORDER — BISACODYL 10 MG RE SUPP: 10.0000 mg | Freq: Every day | RECTAL | Status: DC | PRN

## 2013-12-11 MED ORDER — PROCHLORPERAZINE 25 MG RE SUPP
12.5000 mg | Freq: Four times a day (QID) | RECTAL | Status: DC | PRN
  Filled 2013-12-11: qty 1

## 2013-12-11 MED ORDER — ACETAMINOPHEN 325 MG PO TABS: 325.0000 mg | ORAL_TABLET | ORAL | Status: DC | PRN

## 2013-12-11 MED ORDER — LISINOPRIL 40 MG PO TABS
40.0000 mg | ORAL_TABLET | Freq: Every day | ORAL | Status: DC
  Administered 2013-12-12 – 2013-12-16 (×5): 40 mg via ORAL
  Filled 2013-12-11 (×6): qty 1

## 2013-12-11 MED ORDER — FLEET ENEMA 7-19 GM/118ML RE ENEM: 1.0000 | ENEMA | Freq: Once | RECTAL | Status: AC | PRN

## 2013-12-11 MED ORDER — OXYCODONE HCL 5 MG PO TABS
5.0000 mg | ORAL_TABLET | ORAL | Status: DC | PRN
  Administered 2013-12-11 – 2013-12-16 (×15): 10 mg via ORAL
  Filled 2013-12-11 (×15): qty 2

## 2013-12-11 MED ORDER — DIPHENHYDRAMINE HCL 12.5 MG/5ML PO ELIX
12.5000 mg | ORAL_SOLUTION | Freq: Four times a day (QID) | ORAL | Status: DC | PRN
Start: 2013-12-11 — End: 2013-12-16
  Filled 2013-12-11: qty 10

## 2013-12-11 MED ORDER — METHOCARBAMOL 750 MG PO TABS
750.0000 mg | ORAL_TABLET | Freq: Four times a day (QID) | ORAL | Status: DC
  Administered 2013-12-11 – 2013-12-16 (×20): 750 mg via ORAL
  Filled 2013-12-11 (×24): qty 1

## 2013-12-11 MED ORDER — AMLODIPINE BESYLATE 10 MG PO TABS
10.0000 mg | ORAL_TABLET | Freq: Every day | ORAL | Status: DC
  Administered 2013-12-12 – 2013-12-16 (×5): 10 mg via ORAL
  Filled 2013-12-11 (×6): qty 1

## 2013-12-11 MED ORDER — GUAIFENESIN-DM 100-10 MG/5ML PO SYRP
5.0000 mL | ORAL_SOLUTION | Freq: Four times a day (QID) | ORAL | Status: DC | PRN
Start: 2013-12-11 — End: 2013-12-16

## 2013-12-11 MED ORDER — SENNA 8.6 MG PO TABS
1.0000 | ORAL_TABLET | Freq: Two times a day (BID) | ORAL | Status: DC
  Administered 2013-12-11 – 2013-12-15 (×5): 8.6 mg via ORAL
  Filled 2013-12-11 (×14): qty 1

## 2013-12-11 NOTE — Care Management Note (Signed)
CARE MANAGEMENT NOTE 12/11/2013  Patient:  Jonathan Forbes, Jonathan Forbes   Account Number:  [id number]  Date Initiated:  12/06/2013  Documentation initiated by:  Ricki Miller  Subjective/Objective Assessment:   63 yr old male s/p left total knee arthroplasty.     Action/Plan:   Case Manager spoke with patient concerning home health needs at discharge. Patient states he lives alone and will need shortterm rehab. Social worker notified.   Anticipated DC Date:  12/11/2013   Anticipated DC Plan:  IP REHAB FACILITY  In-house referral  Clinical Social Worker      DC Planning Services  CM consult      Choice offered to / List presented to:             Status of service:  Completed, signed off Medicare Important Message given?   (If response is "NO", the following Medicare IM given date fields will be blank) Date Medicare IM given:   Date Additional Medicare IM given:    Discharge Disposition:  IP REHAB FACILITY

## 2013-12-11 NOTE — Progress Notes (Signed)
Pt requested to be removed from [place] LPN

## 2013-12-11 NOTE — Progress Notes (Signed)
Physical Therapy Evaluation Patient Details Name: Jonathan Forbes MRN: [id number] DOB: 06-Jun-1951 Today's Date: 12/11/2013 Time: 1505-6979 PT Time Calculation (min): 16 min  PT Assessment / Plan / Recommendation History of Present Illness  63 y.o. male admitted to [facility] on 12/05/13 s/p elective L TKA.  The pt is WBAT in [place].    Clinical Impression  Continuing progress with improving ROM and active knee extension against gravity  Hopeful for transfer to CIR later today    PT Assessment       Follow Up Recommendations  CIR    Does the patient have the potential to tolerate intense rehabilitation      Barriers to Discharge        Equipment Recommendations  Rolling walker with 5" wheels;3in1 (PT)    Recommendations for Other Services     Frequency 7X/week    Precautions / Restrictions Precautions Precautions: Knee Required Braces or Orthoses: Knee Immobilizer - Left (Knee is stable in stance, no instances of buckling) Knee Immobilizer - Left: On when out of bed or walking Restrictions LLE Weight Bearing: Weight bearing as tolerated   Pertinent Vitals/Pain 7-8/10 L knee and back patient repositioned for comfort in CPM      Mobility  Bed Mobility Overal bed mobility: Needs Assistance Bed Mobility: Sit to Supine Sit to supine: Supervision General bed mobility comments: Better control getting back to bed Transfers Overall transfer level: Needs assistance Equipment used: Rolling walker (2 wheeled) Transfers: Sit to/from Stand Sit to Stand: Min guard General transfer comment: Cues for hand placement' much better rise pushing up from armrests Ambulation/Gait Ambulation/Gait assistance: Min guard Ambulation Distance (Feet): 20 Feet (to/from bathroom) Assistive device: Rolling walker (2 wheeled) Gait velocity: decreased General Gait Details: Continued good knee stability; limited distance due to back paina nd therefor prioritized therex    Exercises Total Joint  Exercises Long Arc Quad: AROM;Left;10 reps Knee Flexion: AROM;Left;10 reps   PT Diagnosis:    PT Problem List:   PT Treatment Interventions:       PT Goals(Current goals can be found in the care plan section) Acute Rehab PT Goals Patient Stated Goal: rehab then home independently PT Goal Formulation: With patient Time For Goal Achievement: 12/13/13 Potential to Achieve Goals: Good  Visit Information  Last PT Received On: 12/11/13 Assistance Needed: +1 History of Present Illness: 63 y.o. male admitted to [facility] on 12/05/13 s/p elective L TKA.  The pt is WBAT in [place].         Prior Functioning       Cognition  Cognition Arousal/Alertness: Awake/alert Behavior During Therapy: WFL for tasks assessed/performed Overall Cognitive Status: Within Functional Limits for tasks assessed    Extremity/Trunk Assessment     Balance    End of Session PT - End of Session Activity Tolerance: Patient tolerated treatment well (Back pain is affecting activity tolerance) Patient left: in bed;in CPM;with call bell/phone within reach Nurse Communication: Mobility status  GP    [place], [place]  [place] 12/11/2013, 12:51 PM

## 2013-12-11 NOTE — Progress Notes (Signed)
Patient ID: Jonathan Forbes, male   DOB: 06/03/1951, 63 y.o.   MRN: [id number] Pt was admitted to [facility] from [facility]. Admission vital sign are stable. Patient advise to send money home

## 2013-12-11 NOTE — Interval H&P Note (Signed)
Jonathan Forbes was admitted today to Inpatient Rehabilitation with the diagnosis of OA left knee s/p left TKA.  The patient's history has been reviewed, patient examined, and there is no change in status.  Patient continues to be appropriate for intensive inpatient rehabilitation.  I have reviewed the patient's chart and labs.  Questions were answered to the patient's satisfaction.  Kamarii Buren T 12/11/2013, 6:14 PM

## 2013-12-11 NOTE — H&P (Signed)
Physical Medicine and Rehabilitation Admission H&P    CC: OA bilateral knees s/p L-TKR  HPI: Jonathan Forbes is a 63 y.o. male with history of HTN, morbid obesity, depression, OSA, OA left knee with failure of conservative therapy. Patient elected to undergo L-TKR on 12/05/13 by Dr. Erlinda Hong. Post op WBAT and on ASA bid for DVT prophylaxis. On 12/05/13, he was started on IV rocephin for leucocytosis and concerns of UTI. ABLA noted with Hgb down to 12.8. Therapies ongoing and MD/rehab team recommending CIR.     Review of Systems  HENT: Negative for hearing loss.   Eyes: Positive for blurred vision (needs eye exam). Negative for double vision.  Respiratory: Negative for cough and shortness of breath.   Cardiovascular: Negative for chest pain and palpitations.  Gastrointestinal: Positive for nausea and constipation. Negative for heartburn and vomiting.  Genitourinary: Negative for urgency and frequency.  Musculoskeletal: Positive for back pain, joint pain and myalgias.  Neurological: Negative for dizziness and headaches.  Psychiatric/Behavioral: Negative for depression. The patient is not nervous/anxious and does not have insomnia.    Past Medical History  Diagnosis Date  . Hypertension   . OA (osteoarthritis) of knee     Hx: of B/L knees  . Depression   . Cancer     Hx: of skin cancer  . Glaucoma     Hx; of beginning stages  . Eczema     Hx; of  . Anxiety   . Sleep apnea   . Mitral valve regurgitation     Hx; of slight   Past Surgical History  Procedure Laterality Date  . Tumor excision      skin cancer of right side of back  . Cataract extraction w/ intraocular lens  implant, bilateral      Hx; of  . Colonoscopy      Hx; of  . Tonsillectomy    . Total knee arthroplasty Left 12/05/2013    Procedure: LEFT TOTAL KNEE ARTHROPLASTY;  Surgeon: Marianna Payment, MD;  Location: [facility];  Service: Orthopedics;  Laterality: Left;   Family History  Problem Relation Age of Onset    . Heart disease Father   . Hypertension Father    Social History: Lives alone. Independent PTA but limited due to bilateral knee pain. Used to work in [employer]. Disabled due to mental health and OA problems.  Has meals on wheels/groceris on wheels. Has a son in college who check in intermittently. Church members help with transportation. reports that he has never smoked. He has never used smokeless tobacco. He reports that he drinks alcohol. He reports that he does not use illicit drugs.    Allergies: No Known Allergies  Medications Prior to Admission  Medication Sig Dispense Refill  . amLODipine (NORVASC) 10 MG tablet Take 10 mg by mouth daily.      . carvedilol (COREG) 25 MG tablet Take 25 mg by mouth 2 (two) times daily with a meal.      . furosemide (LASIX) 40 MG tablet Take 40 mg by mouth daily.      Marland Kitchen lisinopril (PRINIVIL,ZESTRIL) 40 MG tablet Take 40 mg by mouth daily.      . traMADol (ULTRAM) 50 MG tablet Take 50 mg by mouth 4 (four) times daily.      . traZODone (DESYREL) 100 MG tablet Take 100 mg by mouth at bedtime.      . Vilazodone HCl (VIIBRYD) 40 MG TABS Take 40 mg by mouth daily.  Home: Home Living Family/patient expects to be discharged to:: Inpatient rehab Living Arrangements: Alone Type of Home: House   Functional History:    Functional Status:  Mobility:     Ambulation/Gait Ambulation Distance (Feet): 100 Feet Gait velocity: decreased General Gait Details: Verbal cues for safe use of RW, upright posture and correct leg sequencing. Verbal and tactile cueing also for L quad activation in stance for stability; Nice stable L knee in stance without external support; Improved activity tolerance this afternoon    ADL: ADL Eating/Feeding: Independent Where Assessed - Eating/Feeding: Chair Grooming: Min guard Where Assessed - Grooming: Unsupported standing Upper Body Bathing: Set up Where Assessed - Upper Body Bathing: Unsupported sitting Lower Body  Bathing: Minimal assistance (with AE) Where Assessed - Lower Body Bathing: Unsupported sit to stand Upper Body Dressing: Set up Where Assessed - Upper Body Dressing: Unsupported sitting Lower Body Dressing: Minimal assistance (with AE) Where Assessed - Lower Body Dressing: Unsupported sit to stand Toilet Transfer: Min guard Toilet Transfer Method: Sit to stand Toilet Transfer Equipment: Comfort height toilet;Grab bars Equipment Used: Rolling walker Transfers/Ambulation Related to ADLs: min guard A for all with DME  Cognition: Cognition Overall Cognitive Status: Within Functional Limits for tasks assessed Orientation Level: Oriented X4 Cognition Arousal/Alertness: Awake/alert Behavior During Therapy: WFL for tasks assessed/performed Overall Cognitive Status: Within Functional Limits for tasks assessed   Blood pressure 134/66, pulse 94, temperature 98.7 F (37.1 C), temperature source Oral, resp. rate 16, SpO2 88.00%. Physical Exam  Constitutional: He is oriented to person, place, and time. He appears well-developed and well-nourished. Nasal cannula in place.  Morbidly obese male. HENT:  Head: Normocephalic and atraumatic.  Eyes: Conjunctivae are normal. Pupils are equal, round, and reactive to light.  Neck: Normal range of motion. Neck supple. No tracheal deviation present. No thyromegaly present.  Cardiovascular: Normal rate and regular rhythm.  No murmur heard.  Respiratory: Effort normal and breath sounds normal. He has no wheezes.  Still a little dyspneic  GI: Soft. Bowel sounds are normal. He exhibits no distension. There is no tenderness.  Musculoskeletal:  Left knee with surgical dressing and moderate edema. Wound clean and well approximated. Paraspinal tenderness lower spine.  Neurological: He is alert and oriented to person, place, and time. No cranial nerve deficit. Coordination normal.  Alert and appropriate. UE strength 5/5. RLE functional except for some pain  inhibition with hip rlexion. LLE limited by knee pain but he can lift foot off bed, ADF and APF 4+. Has persistent pain and numbness along lateral hip/thigh  Skin: Skin is warm and dry. Rash (diffuse rash on extremities. ) noted.  Chronic stasis changes noted in both distal legs.  Psychiatric: He has a normal mood and affect. His behavior is normal. Thought content normal.       No results found for this or any previous visit (from the past 48 hour(s)). No results found.  Post Admission Physician Evaluation: 1. Functional deficits secondary  to endstage OA of left knee s/p TKA. 2. Patient is admitted to receive collaborative, interdisciplinary care between the physiatrist, rehab nursing staff, and therapy team. 3. Patient's level of medical complexity and substantial therapy needs in context of that medical necessity cannot be provided at a lesser intensity of care such as a SNF. 4. Patient has experienced substantial functional loss from his/her baseline which was documented above under the "Functional History" and "Functional Status" headings.  Judging by the patient's diagnosis, physical exam, and functional history, the patient has potential   for functional progress which will result in measurable gains while on inpatient rehab.  These gains will be of substantial and practical use upon discharge  in facilitating mobility and self-care at the household level. 5. Physiatrist will provide 24 hour management of medical needs as well as oversight of the therapy plan/treatment and provide guidance as appropriate regarding the interaction of the two. 6. 24 hour rehab nursing will assist with bladder management, bowel management, safety, skin/wound care, disease management, medication administration, pain management and patient education  and help integrate therapy concepts, techniques,education, etc. 7. PT will assess and treat for/with: Lower extremity strength, range of motion, stamina, balance,  functional mobility, safety, adaptive techniques and equipment, knee ROM, pain mgt, ortho precautions, CV stamina.   Goals are: mod i. 8. OT will assess and treat for/with: ADL's, functional mobility, safety, upper extremity strength, adaptive techniques and equipment, pain mgt, ortho precautions, CV stamina.   Goals are: mod I. 9. SLP will assess and treat for/with: n/a.  Goals are: n/a. 10. Case Management and Social Worker will assess and treat for psychological issues and discharge planning. 11. Team conference will be held weekly to assess progress toward goals and to determine barriers to discharge. 12. Patient will receive at least 3 hours of therapy per day at least 5 days per week. 13. ELOS: 5-7 days       14. Prognosis:  excellent   Medical Problem List and Plan: Left TKA due to endstage OA in morbidly obese male  1. DVT Prophylaxis/Anticoagulation: Pharmaceutical: Lovenox 2. Pain Management:  On Oxycontin, ultram qid as well as prn oxycodone.  3. H/o Depression Hx with panic attacks/ Mood: Continue trazodone for insomnia and Viibryd for mood. Reports realistic outlook overall without mood disruption.  LCSW to follow for evaluation.  4. Neuropsych: This patient is capable of making decisions on her own behalf. 5. OSA: Use CPAP while in hospital.  6. ABLA: Will recheck in am.  7. Morbid obesity:  Pressure relief measures. Will have dietician educate patient on low fat diet.  8. Leucocytosis: Likely reactive. UCS negative. Will discontinue rocephin and monitor wound/temperature curve.  9. Lumbago: Likely due to muscle strain, chronic low back pain. May be a referral component as well. continue K pad. Schedule muscle relaxants. oob of bed, improved posture, more sitting. 10. HTN:  Will monitor BP with bid checks. Continue lasix, norvasc and coreg bid.  11. Likely meralgia paresthetica RLE. Gabapentin trial, positional changes.    Meredith Staggers, MD, [facility]   12/11/2013

## 2013-12-11 NOTE — Progress Notes (Signed)
Patient d/c to inpatient rehab.  Report called to 4MW.

## 2013-12-11 NOTE — PMR Pre-admission (Signed)
PMR Admission Coordinator Pre-Admission Assessment  Patient: Jonathan Forbes is an 63 y.o., male MRN: [id number] DOB: [birth year] Height:   Weight:                Insurance Information  PRIMARY: Medicaid of [place]      Policy#: [id number]      Subscriber: self Employer: retired   Designer, industrial/product #: [id number]     Eff. Date:  active            Emergency Contact Information Contact Information   Name Relation Home Work Mobile   Shi,Chris Son [phone]  [phone]     Current Medical History  Patient Admitting Diagnosis: Left TKA due to endstage OA in morbidly obese male   History of Present Illness: Jonathan Forbes is a 63 y.o. male with history of HTN, morbid obesity, depression, OSA, OA left knee with failure of conservative therapy. Patient elected to undergo L-TKR on 12/05/13 by Dr. Erlinda Hong. Post op WBAT and on ASA bid for DVT prophylaxis. On 12/05/13, he was started on IV rocephin for leucocytosis and concerns of UTI. ABLA noted with Hgb down to 12.8. Therapies ongoing and MD/rehab team recommending CIR.  In past several days, pt. Has complained of burning pain R lateral upper thigh.      Past Medical History  Past Medical History  Diagnosis Date  . Hypertension   . OA (osteoarthritis) of knee     Hx: of B/L knees  . Depression   . Cancer     Hx: of skin cancer  . Glaucoma     Hx; of beginning stages  . Eczema     Hx; of  . Anxiety   . Sleep apnea   . Mitral valve regurgitation     Hx; of slight    Family History  family history includes Heart disease in his father; Hypertension in his father.  Prior Rehab/Hospitalizations: Pt. Reports an outpatient surgery approximately 3 months ago with an eventual wound vac on surgical incision on back to facilitate healing    Current Medications  Current facility-administered medications:0.9 %  sodium chloride infusion, , Intravenous, Continuous, Naiping Eduard Roux, MD, Last Rate: 125 mL/hr at 12/06/13 0747;  acetaminophen (TYLENOL)  suppository 650 mg, 650 mg, Rectal, Q6H PRN, Marianna Payment, MD;  acetaminophen (TYLENOL) tablet 650 mg, 650 mg, Oral, Q6H PRN, Naiping Eduard Roux, MD alum & mag hydroxide-simeth (MAALOX/MYLANTA) 200-200-20 MG/5ML suspension 30 mL, 30 mL, Oral, Q4H PRN, Naiping Eduard Roux, MD;  amLODipine (NORVASC) tablet 10 mg, 10 mg, Oral, Daily, Naiping Eduard Roux, MD, 10 mg at 12/11/13 1002;  aspirin EC tablet 325 mg, 325 mg, Oral, BID, Naiping Eduard Roux, MD, 325 mg at 12/11/13 0815;  carvedilol (COREG) tablet 25 mg, 25 mg, Oral, BID WC, Naiping Eduard Roux, MD, 25 mg at 12/11/13 0815 cefTRIAXone (ROCEPHIN) 1 g in dextrose 5 % 50 mL IVPB, 1 g, Intravenous, Q24H, Naiping Eduard Roux, MD, 1 g at 12/10/13 1845;  diphenhydrAMINE (BENADRYL) 12.5 MG/5ML elixir 25 mg, 25 mg, Oral, Q4H PRN, Marianna Payment, MD;  furosemide (LASIX) tablet 40 mg, 40 mg, Oral, Daily, Naiping Eduard Roux, MD, 40 mg at 12/11/13 1001;  lactated ringers infusion, , Intravenous, Continuous, Lillia Abed, MD, Last Rate: 20 mL/hr at 12/05/13 1058 lisinopril (PRINIVIL,ZESTRIL) tablet 40 mg, 40 mg, Oral, Daily, Naiping Eduard Roux, MD, 40 mg at 12/11/13 1002;  menthol-cetylpyridinium (CEPACOL) lozenge 3 mg, 1 lozenge, Oral, PRN, Naiping Eduard Roux, MD;  methocarbamol (ROBAXIN) 500 mg  in dextrose 5 % 50 mL IVPB, 500 mg, Intravenous, Q6H PRN, Naiping Eduard Roux, MD;  methocarbamol (ROBAXIN) tablet 500 mg, 500 mg, Oral, Q6H PRN, Marianna Payment, MD, 500 mg at 12/09/13 2037 metoCLOPramide (REGLAN) injection 5-10 mg, 5-10 mg, Intravenous, Q8H PRN, Marianna Payment, MD, 10 mg at 12/10/13 0024;  metoCLOPramide (REGLAN) tablet 5-10 mg, 5-10 mg, Oral, Q8H PRN, Marianna Payment, MD;  morphine 2 MG/ML injection 2 mg, 2 mg, Intravenous, Q2H PRN, Naiping Eduard Roux, MD, 2 mg at 12/06/13 2325;  ondansetron [facility]) injection 4 mg, 4 mg, Intravenous, Q6H PRN, Marianna Payment, MD ondansetron [facility]) tablet 4 mg, 4 mg, Oral, Q6H PRN, Marianna Payment, MD, 4 mg  at 12/10/13 [id number];  oxyCODONE (Oxy IR/ROXICODONE) immediate release tablet 5-10 mg, 5-10 mg, Oral, Q3H PRN, Marianna Payment, MD, 10 mg at 12/11/13 [id number];  OxyCODONE (OXYCONTIN) 12 hr tablet 15 mg, 15 mg, Oral, Q12H, Naiping Eduard Roux, MD, 15 mg at 12/11/13 0909;  phenol (CHLORASEPTIC) mouth spray 1 spray, 1 spray, Mouth/Throat, PRN, Naiping Eduard Roux, MD polyethylene glycol (MIRALAX / GLYCOLAX) packet 17 g, 17 g, Oral, Daily PRN, Naiping Eduard Roux, MD;  senna [facility]) tablet 8.6 mg, 1 tablet, Oral, BID, Naiping Eduard Roux, MD, 8.6 mg at 12/09/13 2138;  sorbitol 70 % solution 30 mL, 30 mL, Oral, Daily PRN, Marianna Payment, MD;  traMADol Veat[facility]bon) tablet 50 mg, 50 mg, Oral, QID, Naiping Eduard Roux, MD, 50 mg at 12/11/13 1321 traZODone (DESYREL) tablet 100 mg, 100 mg, Oral, QHS, Naiping Eduard Roux, MD, 100 mg at 12/10/13 2104;  Vilazodone HCl (VIIBRYD) TABS 40 mg, 40 mg, Oral, Daily, Naiping Eduard Roux, MD, 40 mg at 12/11/13 1001  Patients Current Diet: regular with thin liquids  Precautions / Restrictions Precautions Precautions: Knee Precaution Booklet Issued: Yes (comment) Precaution Comments: reviewed knee precautions and provided handout for precautions and exercises Restrictions Weight Bearing Restrictions: Yes LLE Weight Bearing: Weight bearing as tolerated Other Position/Activity Restrictions: Pt. knew he is not to have anything under knee (pillow,etc)   Prior Activity Level Limited Community (1-2x/wk): Pt. reports he has a friend Surveyor, minerals who takes him to get groceries.  He uses a transport service to get to his doctor's appointments. Home Assistive Devices / Equipment Home Assistive Devices/Equipment: None  Prior Functional Level Prior Function Level of Independence: Independent  Current Functional Level Cognition  Overall Cognitive Status: Within Functional Limits for tasks assessed Orientation Level: Oriented X4    Extremity Assessment (includes Sensation/Coordination)           ADLs  Eating/Feeding: Independent Where Assessed - Eating/Feeding: Chair Grooming: Min guard Where Assessed - Grooming: Unsupported standing Upper Body Bathing: Set up Where Assessed - Upper Body Bathing: Unsupported sitting Lower Body Bathing: Minimal assistance (with AE) Where Assessed - Lower Body Bathing: Unsupported sit to stand Upper Body Dressing: Set up Where Assessed - Upper Body Dressing: Unsupported sitting Lower Body Dressing: Minimal assistance (with AE) Where Assessed - Lower Body Dressing: Unsupported sit to stand Toilet Transfer: Min Psychiatric nurse Method: Sit to Loss adjuster, chartered: Comfort height toilet;Grab bars Toileting - Water quality scientist and Hygiene: Independent Where Assessed - Best boy and Hygiene: Sit on 3-in-1 or toilet Equipment Used: Rolling walker Transfers/Ambulation Related to ADLs: min guard A for all with DME    Mobility  Overal bed mobility: Needs Assistance Bed Mobility: Sit to Supine Supine to sit: Supervision Sit to supine: Supervision General bed mobility comments: Better control getting  back to bed    Transfers  Overall transfer level: Needs assistance Equipment used: Rolling walker (2 wheeled) Transfers: Sit to/from Stand Sit to Stand: Min guard General transfer comment: Cues for hand placement' much better rise pushing up from armrests    Ambulation / Gait / Stairs / Emergency planning/management officer  Ambulation/Gait Ambulation Distance (Feet): 20 Feet (to/from bathroom) Gait velocity: decreased General Gait Details: Continued good knee stability; limited distance due to back paina nd therefor prioritized therex    Posture / Balance          Previous Home Environment Living Arrangements: Alone Available Help at Discharge: Family;Friend(s);Available PRN/intermittently;Other (Comment) (pt's son and church members PRN) Type of Home: Apartment Home Layout: One level Home Access:  Elevator Bathroom Shower/Tub: Product/process development scientist: Standard Bathroom Accessibility: Yes How Accessible: Accessible via walker [place]: Yes Type of Home Care Services: [facility] (if known): DOES NOT KNOW NAME OF AGENCY AT THIS TIME Additional Comments: Pt.reports he had a VAC for a surgical wound on his back about 3 months ago  Discharge Living Setting Plans for Discharge Living Setting: Patient's home Type of Home at Discharge: Apartment Discharge Home Layout: One level Discharge Home Access: Elevator Discharge Bathroom Shower/Tub: Tub/shower unit;Curtain Discharge Bathroom Toilet: Standard Discharge Bathroom Accessibility: Yes How Accessible: Accessible via walker Does the patient have any problems obtaining your medications?: No  Social/Family/Support Systems Patient Roles: Parent Contact Information: son Anticipated Caregiver: self with occasional assist from son and church members and friend Caregiver Availability: Intermittent Discharge Plan Discussed with Primary Caregiver: Yes Is Caregiver In Agreement with Plan?: Yes Does Caregiver/Family have Issues with Lodging/Transportation while Pt is in Rehab?: No    Goals/Additional Needs Patient/Family Goal for Rehab: mod I PT/OT, ST n/a Expected length of stay: 6-7 days Cultural Considerations: "I'm a Christian" Dietary Needs: regular, thin liquids Additional Information: may want to consider nutritional consult to address weight loss needs Pt/Family Agrees to Admission and willing to participate: Yes Program Orientation Provided & Reviewed with Pt/Caregiver Including Roles  & Responsibilities: Yes  SPECIAL NEEDS/CARE CONSIDERATIONS Bipap/cpap  Not currently,  but pt has sleep study appointment for end of March, per pt. CPM  Yes currently 0-90 Skin   Intact;  surgical wound L knee with intact dressing Bowel  Last BM 2/16 per pt.  Bladder  Continent per pt.  Daibetic  Management pt. Denies diabetes  Decrease burden of Care through IP rehab admission: no   Possible need for SNF placement upon discharge: not anticipated      Patient Condition: This patient's condition remains as documented in the consult dated 12/10/13 , in which the Rehabilitation Physician determined and documented that the patient's condition is appropriate for intensive rehabilitative care in an inpatient rehabilitation facility. Will admit to inpatient rehab today.  Preadmission Screen Completed By:  [name], PT2/17/2015 2:01 PM ______________________________________________________________________   Discussed status with Dr.Swartz on 12/11/13 at 1443  and received telephone approval for admission today.  Admission Coordinator:  [name], time [address] 12/11/13

## 2013-12-11 NOTE — Progress Notes (Signed)
Orthopedic Tech Progress Note Patient Details:  Jonathan Forbes June 25, 1951 [id number] On cpm at 9:20 LLE 0-60  Patient ID: Jonathan Forbes, male   DOB: [birth year], 63 y.o.   MRN: [id number]   [name] 12/11/2013, 9:18 PM

## 2013-12-11 NOTE — Progress Notes (Signed)
Pt. offered CPAP-refused , "hasn't wore in years", planning on new sleep study, told to notify if needed.

## 2013-12-11 NOTE — H&P (View-Only) (Signed)
Physical Medicine and Rehabilitation Admission H&P    CC: OA bilateral knees s/p L-TKR  HPI: Jonathan Forbes is a 63 y.o. male with history of HTN, morbid obesity, depression, OSA, OA left knee with failure of conservative therapy. Patient elected to undergo L-TKR on 12/05/13 by Dr. Erlinda Hong. Post op WBAT and on ASA bid for DVT prophylaxis. On 12/05/13, he was started on IV rocephin for leucocytosis and concerns of UTI. ABLA noted with Hgb down to 12.8. Therapies ongoing and MD/rehab team recommending CIR.     Review of Systems  HENT: Negative for hearing loss.   Eyes: Positive for blurred vision (needs eye exam). Negative for double vision.  Respiratory: Negative for cough and shortness of breath.   Cardiovascular: Negative for chest pain and palpitations.  Gastrointestinal: Positive for nausea and constipation. Negative for heartburn and vomiting.  Genitourinary: Negative for urgency and frequency.  Musculoskeletal: Positive for back pain, joint pain and myalgias.  Neurological: Negative for dizziness and headaches.  Psychiatric/Behavioral: Negative for depression. The patient is not nervous/anxious and does not have insomnia.    Past Medical History  Diagnosis Date  . Hypertension   . OA (osteoarthritis) of knee     Hx: of B/L knees  . Depression   . Cancer     Hx: of skin cancer  . Glaucoma     Hx; of beginning stages  . Eczema     Hx; of  . Anxiety   . Sleep apnea   . Mitral valve regurgitation     Hx; of slight   Past Surgical History  Procedure Laterality Date  . Tumor excision      skin cancer of right side of back  . Cataract extraction w/ intraocular lens  implant, bilateral      Hx; of  . Colonoscopy      Hx; of  . Tonsillectomy    . Total knee arthroplasty Left 12/05/2013    Procedure: LEFT TOTAL KNEE ARTHROPLASTY;  Surgeon: Marianna Payment, MD;  Location: [facility];  Service: Orthopedics;  Laterality: Left;   Family History  Problem Relation Age of Onset    . Heart disease Father   . Hypertension Father    Social History: Lives alone. Independent PTA but limited due to bilateral knee pain. Used to work in [employer]. Disabled due to mental health and OA problems.  Has meals on wheels/groceris on wheels. Has a son in college who check in intermittently. Church members help with transportation. reports that he has never smoked. He has never used smokeless tobacco. He reports that he drinks alcohol. He reports that he does not use illicit drugs.    Allergies: No Known Allergies  Medications Prior to Admission  Medication Sig Dispense Refill  . amLODipine (NORVASC) 10 MG tablet Take 10 mg by mouth daily.      . carvedilol (COREG) 25 MG tablet Take 25 mg by mouth 2 (two) times daily with a meal.      . furosemide (LASIX) 40 MG tablet Take 40 mg by mouth daily.      Marland Kitchen lisinopril (PRINIVIL,ZESTRIL) 40 MG tablet Take 40 mg by mouth daily.      . traMADol (ULTRAM) 50 MG tablet Take 50 mg by mouth 4 (four) times daily.      . traZODone (DESYREL) 100 MG tablet Take 100 mg by mouth at bedtime.      . Vilazodone HCl (VIIBRYD) 40 MG TABS Take 40 mg by mouth daily.  Home: Home Living Family/patient expects to be discharged to:: Inpatient rehab Living Arrangements: Alone Type of Home: House   Functional History:    Functional Status:  Mobility:     Ambulation/Gait Ambulation Distance (Feet): 100 Feet Gait velocity: decreased General Gait Details: Verbal cues for safe use of RW, upright posture and correct leg sequencing. Verbal and tactile cueing also for L quad activation in stance for stability; Nice stable L knee in stance without external support; Improved activity tolerance this afternoon    ADL: ADL Eating/Feeding: Independent Where Assessed - Eating/Feeding: Chair Grooming: Min guard Where Assessed - Grooming: Unsupported standing Upper Body Bathing: Set up Where Assessed - Upper Body Bathing: Unsupported sitting Lower Body  Bathing: Minimal assistance (with AE) Where Assessed - Lower Body Bathing: Unsupported sit to stand Upper Body Dressing: Set up Where Assessed - Upper Body Dressing: Unsupported sitting Lower Body Dressing: Minimal assistance (with AE) Where Assessed - Lower Body Dressing: Unsupported sit to stand Toilet Transfer: Min Psychiatric nurse Method: Sit to Loss adjuster, chartered: Comfort height toilet;Grab bars Equipment Used: Rolling walker Transfers/Ambulation Related to ADLs: min guard A for all with DME  Cognition: Cognition Overall Cognitive Status: Within Functional Limits for tasks assessed Orientation Level: Oriented X4 Cognition Arousal/Alertness: Awake/alert Behavior During Therapy: WFL for tasks assessed/performed Overall Cognitive Status: Within Functional Limits for tasks assessed   Blood pressure 134/66, pulse 94, temperature 98.7 F (37.1 C), temperature source Oral, resp. rate 16, SpO2 88.00%. Physical Exam  Constitutional: He is oriented to person, place, and time. He appears well-developed and well-nourished. Nasal cannula in place.  Morbidly obese male. HENT:  Head: Normocephalic and atraumatic.  Eyes: Conjunctivae are normal. Pupils are equal, round, and reactive to light.  Neck: Normal range of motion. Neck supple. No tracheal deviation present. No thyromegaly present.  Cardiovascular: Normal rate and regular rhythm.  No murmur heard.  Respiratory: Effort normal and breath sounds normal. He has no wheezes.  Still a little dyspneic  GI: Soft. Bowel sounds are normal. He exhibits no distension. There is no tenderness.  Musculoskeletal:  Left knee with surgical dressing and moderate edema. Wound clean and well approximated. Paraspinal tenderness lower spine.  Neurological: He is alert and oriented to person, place, and time. No cranial nerve deficit. Coordination normal.  Alert and appropriate. UE strength 5/5. RLE functional except for some pain  inhibition with hip rlexion. LLE limited by knee pain but he can lift foot off bed, ADF and APF 4+. Has persistent pain and numbness along lateral hip/thigh  Skin: Skin is warm and dry. Rash (diffuse rash on extremities. ) noted.  Chronic stasis changes noted in both distal legs.  Psychiatric: He has a normal mood and affect. His behavior is normal. Thought content normal.       No results found for this or any previous visit (from the past 48 hour(s)). No results found.  Post Admission Physician Evaluation: 1. Functional deficits secondary  to endstage OA of left knee s/p TKA. 2. Patient is admitted to receive collaborative, interdisciplinary care between the physiatrist, rehab nursing staff, and therapy team. 3. Patient's level of medical complexity and substantial therapy needs in context of that medical necessity cannot be provided at a lesser intensity of care such as a SNF. 4. Patient has experienced substantial functional loss from his/her baseline which was documented above under the "Functional History" and "Functional Status" headings.  Judging by the patient's diagnosis, physical exam, and functional history, the patient has potential  for functional progress which will result in measurable gains while on inpatient rehab.  These gains will be of substantial and practical use upon discharge  in facilitating mobility and self-care at the household level. 5. Physiatrist will provide 24 hour management of medical needs as well as oversight of the therapy plan/treatment and provide guidance as appropriate regarding the interaction of the two. 6. 24 hour rehab nursing will assist with bladder management, bowel management, safety, skin/wound care, disease management, medication administration, pain management and patient education  and help integrate therapy concepts, techniques,education, etc. 7. PT will assess and treat for/with: Lower extremity strength, range of motion, stamina, balance,  functional mobility, safety, adaptive techniques and equipment, knee ROM, pain mgt, ortho precautions, CV stamina.   Goals are: mod i. 8. OT will assess and treat for/with: ADL's, functional mobility, safety, upper extremity strength, adaptive techniques and equipment, pain mgt, ortho precautions, CV stamina.   Goals are: mod I. 9. SLP will assess and treat for/with: n/a.  Goals are: n/a. 10. Case Management and Social Worker will assess and treat for psychological issues and discharge planning. 11. Team conference will be held weekly to assess progress toward goals and to determine barriers to discharge. 12. Patient will receive at least 3 hours of therapy per day at least 5 days per week. 13. ELOS: 5-7 days       14. Prognosis:  excellent   Medical Problem List and Plan: Left TKA due to endstage OA in morbidly obese male  1. DVT Prophylaxis/Anticoagulation: Pharmaceutical: Lovenox 2. Pain Management:  On Oxycontin, ultram qid as well as prn oxycodone.  3. H/o Depression Hx with panic attacks/ Mood: Continue trazodone for insomnia and Viibryd for mood. Reports realistic outlook overall without mood disruption.  LCSW to follow for evaluation.  4. Neuropsych: This patient is capable of making decisions on her own behalf. 5. OSA: Use CPAP while in hospital.  6. ABLA: Will recheck in am.  7. Morbid obesity:  Pressure relief measures. Will have dietician educate patient on low fat diet.  8. Leucocytosis: Likely reactive. UCS negative. Will discontinue rocephin and monitor wound/temperature curve.  9. Lumbago: Likely due to muscle strain, chronic low back pain. May be a referral component as well. continue K pad. Schedule muscle relaxants. oob of bed, improved posture, more sitting. 10. HTN:  Will monitor BP with bid checks. Continue lasix, norvasc and coreg bid.  11. Likely meralgia paresthetica RLE. Gabapentin trial, positional changes.    Meredith Staggers, MD, [facility]   12/11/2013

## 2013-12-11 NOTE — Progress Notes (Signed)
Inpt. Rehab  I plan to admit Jonathan Forbes to IP rehab today. Dr. Erlinda Hong agreeable and case manager Ricki Miller is aware.  Please call if questions.    [facility] Admissions Coordinator Cell [phone] Office [phone]

## 2013-12-12 ENCOUNTER — Inpatient Hospital Stay (HOSPITAL_COMMUNITY): Payer: Medicaid Other

## 2013-12-12 ENCOUNTER — Inpatient Hospital Stay (HOSPITAL_COMMUNITY): Payer: Medicaid Other | Admitting: Occupational Therapy

## 2013-12-12 DIAGNOSIS — I10 Essential (primary) hypertension: Secondary | ICD-10-CM

## 2013-12-12 DIAGNOSIS — Z96659 Presence of unspecified artificial knee joint: Secondary | ICD-10-CM

## 2013-12-12 DIAGNOSIS — M1710 Unilateral primary osteoarthritis, unspecified knee: Secondary | ICD-10-CM

## 2013-12-12 LAB — CBC WITH DIFFERENTIAL/PLATELET
Basophils Absolute: 0.1 10*3/uL (ref 0.0–0.1)
Basophils Relative: 1 % (ref 0–1)
EOS ABS: 0.4 10*3/uL (ref 0.0–0.7)
Eosinophils Relative: 5 % (ref 0–5)
HCT: 34.5 % — ABNORMAL LOW (ref 39.0–52.0)
Hemoglobin: 11.5 g/dL — ABNORMAL LOW (ref 13.0–17.0)
Lymphocytes Relative: 28 % (ref 12–46)
Lymphs Abs: 2.1 10*3/uL (ref 0.7–4.0)
MCH: 30.5 pg (ref 26.0–34.0)
MCHC: 33.3 g/dL (ref 30.0–36.0)
MCV: 91.5 fL (ref 78.0–100.0)
MONOS PCT: 9 % (ref 3–12)
Monocytes Absolute: 0.7 10*3/uL (ref 0.1–1.0)
Neutro Abs: 4.4 10*3/uL (ref 1.7–7.7)
Neutrophils Relative %: 58 % (ref 43–77)
PLATELETS: 236 10*3/uL (ref 150–400)
RBC: 3.77 MIL/uL — ABNORMAL LOW (ref 4.22–5.81)
RDW: 14.6 % (ref 11.5–15.5)
WBC: 7.6 10*3/uL (ref 4.0–10.5)

## 2013-12-12 LAB — COMPREHENSIVE METABOLIC PANEL
ALT: 33 U/L (ref 0–53)
AST: 20 U/L (ref 0–37)
Albumin: 2.9 g/dL — ABNORMAL LOW (ref 3.5–5.2)
Alkaline Phosphatase: 81 U/L (ref 39–117)
BUN: 12 mg/dL (ref 6–23)
CALCIUM: 8.8 mg/dL (ref 8.4–10.5)
CO2: 29 mEq/L (ref 19–32)
CREATININE: 0.88 mg/dL (ref 0.50–1.35)
Chloride: 100 mEq/L (ref 96–112)
GLUCOSE: 121 mg/dL — AB (ref 70–99)
Potassium: 4.2 mEq/L (ref 3.7–5.3)
SODIUM: 139 meq/L (ref 137–147)
TOTAL PROTEIN: 6.4 g/dL (ref 6.0–8.3)
Total Bilirubin: 0.7 mg/dL (ref 0.3–1.2)

## 2013-12-12 NOTE — Progress Notes (Signed)
Subjective/Complaints: Had a reasonable night. Knee/thigh still tender. Ready to get started today A 12 point review of systems has been performed and if not noted above is otherwise negative.   Objective: Vital Signs: Blood pressure 134/86, pulse 76, temperature 98.1 F (36.7 C), temperature source Oral, resp. rate 19, SpO2 90.00%. No results found.  Recent Labs  12/12/13 0708  WBC 7.6  HGB 11.5*  HCT 34.5*  PLT 236   No results found for this basename: NA, K, CL, CO, GLUCOSE, BUN, CREATININE, CALCIUM,  in the last 72 hours CBG (last 3)  No results found for this basename: GLUCAP,  in the last 72 hours  Wt Readings from Last 3 Encounters:  11/29/13 157.625 kg (347 lb 8 oz)    Physical Exam:  Constitutional: He is oriented to person, place, and time. He appears well-developed and well-nourished. Nasal cannula in place.  Morbidly obese male.  HENT:  Head: Normocephalic and atraumatic.  Eyes: Conjunctivae are normal. Pupils are equal, round, and reactive to light.  Neck: Normal range of motion. Neck supple. No tracheal deviation present. No thyromegaly present.  Cardiovascular: Normal rate and regular rhythm.  No murmur heard.  Respiratory: Effort normal and breath sounds normal. He has no wheezes.  Still a little dyspneic  GI: Soft. Bowel sounds are normal. He exhibits no distension. There is no tenderness.  Musculoskeletal:  Left knee with surgical dressing and moderate edema. Wound clean and well approximated. Paraspinal tenderness lower spine.  Neurological: He is alert and oriented to person, place, and time. No cranial nerve deficit. Coordination normal.  Alert and appropriate. UE strength 5/5. RLE functional except for some pain inhibition with hip flexion. LLE limited by knee pain but he can lift foot off bed, ADF and APF 4+. Has persistent pain and numbness along lateral hip/thigh  Skin: Skin is warm and dry. Rash (diffuse rash on extremities. ) noted.  Chronic stasis changes noted in both distal legs.  Psychiatric: He has a normal mood and affect. His behavior is normal. Thought content normal.    Assessment/Plan: 1. Functional deficits secondary to left TKA which require 3+ hours per day of interdisciplinary therapy in a comprehensive inpatient rehab setting. Physiatrist is providing close team supervision and 24 hour management of active medical problems listed below. Physiatrist and rehab team continue to assess barriers to discharge/monitor patient progress toward functional and medical goals. FIM:                                  Medical Problem List and Plan:  Left TKA due to endstage OA in morbidly obese male  1. DVT Prophylaxis/Anticoagulation: Pharmaceutical: Lovenox  2. Pain Management: On Oxycontin, ultram qid as well as prn oxycodone.   -chronic peripheral neuropathy, ?OA of feet   -gabapentin trial 3. H/o Depression Hx with panic attacks/ Mood: Continue trazodone for insomnia and Viibryd for mood. Reports realistic outlook overall without mood disruption. LCSW to follow for evaluation.  4. Neuropsych: This patient is capable of making decisions on her own behalf.  5. OSA: Use CPAP while in hospital.  6. ABLA:  hgb 11.5 7. Morbid obesity: Pressure relief measures. Will have dietician educate patient on low fat diet.  8. Leucocytosis: Likely reactive. UCS negative. Will discontinue rocephin and monitor wound/temperature curve.  9. Lumbago: Likely due to muscle strain, chronic low back pain. May be a referral  component as well. continue K pad. Schedule muscle relaxants. oob of bed, improved posture, more sitting.  10. HTN: Will monitor BP with bid checks. Continue lasix, norvasc and coreg bid.  11. Likely meralgia paresthetica RLE. Gabapentin trial, positional changes.  LOS (Days) 1 A FACE TO FACE EVALUATION WAS PERFORMED  Jonathan Forbes T 12/12/2013 8:02 AM

## 2013-12-12 NOTE — Evaluation (Signed)
Occupational Therapy Assessment and Plan & Session Notes  Patient Details  Name: Jonathan Forbes MRN: [id number] Date of Birth: Jan 26, 1951  OT Diagnosis: acute pain and muscle weakness (generalized) Rehab Potential: Rehab Potential: Excellent ELOS: 5-7 days   Today's Date: 12/12/2013  Problem List:  Patient Active Problem List   Diagnosis Date Noted  . OA (osteoarthritis) of knee 12/11/2013  . Osteoarthritis of left knee 12/05/2013  . Total knee replacement status 12/05/2013    Past Medical History:  Past Medical History  Diagnosis Date  . Hypertension   . OA (osteoarthritis) of knee     Hx: of B/L knees  . Depression   . Cancer     Hx: of skin cancer  . Glaucoma     Hx; of beginning stages  . Eczema     Hx; of  . Anxiety   . Sleep apnea   . Mitral valve regurgitation     Hx; of slight   Past Surgical History:  Past Surgical History  Procedure Laterality Date  . Tumor excision      skin cancer of right side of back  . Cataract extraction w/ intraocular lens  implant, bilateral      Hx; of  . Colonoscopy      Hx; of  . Tonsillectomy    . Total knee arthroplasty Left 12/05/2013    Procedure: LEFT TOTAL KNEE ARTHROPLASTY;  Surgeon: Jonathan Payment, MD;  Location: [facility];  Service: Orthopedics;  Laterality: Left;     Clinical Impression: Jonathan Forbes is a 63 y.o. male with history of HTN, morbid obesity, depression, OSA, OA left knee with failure of conservative therapy. Patient elected to undergo L-TKR on 12/05/13 by Dr. Erlinda Forbes. Post op WBAT and on ASA bid for DVT prophylaxis. On 12/05/13, he was started on IV rocephin for leucocytosis and concerns of UTI. ABLA noted with Hgb down to 12.8. Therapies ongoing and MD/rehab team recommending CIR. Patient transferred to CIR on 12/11/2013 .    Patient currently requires supervision> min assist with basic self-care skills and IADL secondary to muscle weakness and muscle joint tightness and decreased standing balance,  decreased postural control and decreased balance strategies.  Prior to hospitalization, patient could complete ADLs & IADLs independently.   Patient will benefit from skilled intervention to increase independence with basic self-care skills prior to discharge home independently.  Anticipate patient will require intermittent supervision and no further OT follow recommended.  OT - End of Session Activity Tolerance: Tolerates 30+ min activity with multiple rests Endurance Deficit: Yes OT Assessment Rehab Potential: Excellent Barriers to Discharge: Decreased caregiver support OT Patient demonstrates impairments in the following area(s): Balance;Edema;Endurance;Pain;Safety;Skin Integrity OT Basic ADL's Functional Problem(s): Grooming;Bathing;Dressing;Toileting OT Advanced ADL's Functional Problem(s): Simple Meal Preparation;Laundry OT Transfers Functional Problem(s): Toilet;Tub/Shower OT Additional Impairment(s): None OT Plan OT Intensity: Minimum of 1-2 x/day, 45 to 90 minutes OT Frequency: 5 out of 7 days OT Duration/Estimated Length of Stay: 5-7 days OT Treatment/Interventions: Balance/vestibular training;Community reintegration;Discharge planning;DME/adaptive equipment instruction;Functional mobility training;Pain management;Patient/family education;Psychosocial support;Self Care/advanced ADL retraining;Skin care/wound managment;Therapeutic Activities;Therapeutic Exercise;UE/LE Strength taining/ROM;UE/LE Coordination activities OT Self Feeding Anticipated Outcome(s): independent OT Basic Self-Care Anticipated Outcome(s): mod I  OT Toileting Anticipated Outcome(s): mod I OT Bathroom Transfers Anticipated Outcome(s): mod I OT Recommendation Patient destination: Home Follow Up Recommendations: None Equipment Recommended: 3 in 1 bedside comode;Tub/shower bench  Precautions/Restrictions  Precautions Precautions: Knee Restrictions Weight Bearing Restrictions: No LLE Weight Bearing: Weight  bearing as tolerated  General Chart Reviewed:  Yes Family/Caregiver Present: No  Pain Pain Assessment Pain Assessment: 0-10 Pain Score: 6  Faces Pain Scale: Hurts even more Pain Type: Surgical pain Pain Location: Knee Pain Orientation: Left Pain Descriptors / Indicators: Aching Pain Frequency: Constant Pain Onset: On-going Patients Stated Pain Goal: 3 Pain Intervention(s): Medication (See eMAR)  Home Living/Prior Functioning Home Living Family/patient expects to be discharged to:: Private residence Living Arrangements: Alone Available Help at Discharge: Family;Friend(s);Available PRN/intermittently;Other (Comment) (patient reports church members can help prn) Type of Home: Apartment Home Access: Elevator Home Layout: One level Additional Comments: Pt.reports he had a VAC for a surgical wound on his back about 3 months ago, surgical wound from skin cancer removal  Lives With: Alone IADL History Homemaking Responsibilities: Yes Meal Prep Responsibility: Primary Laundry Responsibility: Primary Cleaning Responsibility: Primary Shopping Responsibility: Primary Child Care Responsibility: No Current License: No Mode of Transportation: Lucianne Lei (patient reports he uses a transportation Printmaker) Occupation: Unemployed Leisure and Hobbies: playing video games Prior Function Level of Independence: Independent with basic ADLs;Independent with transfers;Independent with gait  Able to Take Stairs?: Reciprically ("not very well") Driving: No  ADL - See FIM  Vision/Perception  Vision - History Visual History: Glaucoma (according to patient, dr reports beginning of glaucoma) Patient Visual Report: No change from baseline Perception Perception: Within Functional Limits Praxis Praxis: Intact   Cognition Overall Cognitive Status: Within Functional Limits for tasks assessed Orientation Level: Oriented X4 Memory: Appears intact Awareness: Appears intact Problem Solving: Appears  intact Safety/Judgment: Appears intact  Sensation Sensation Additional Comments: BUEs appear intact Coordination Gross Motor Movements are Fluid and Coordinated: Yes Fine Motor Movements are Fluid and Coordinated: Yes  Motor - WNL  Trunk/Postural Assessment - WNL  Balance - Supervision>steady assist  Extremity/Trunk Assessment RUE Assessment RUE Assessment: Within Functional Limits LUE Assessment LUE Assessment: Within Functional Limits  FIM:  FIM - Eating Eating Activity: 7: Complete independence:no helper FIM - Grooming Grooming: 0: Activity did not occur FIM - Bathing Bathing Steps Patient Completed: Chest;Right Arm;Left Arm;Abdomen;Front perineal area;Buttocks;Right upper leg;Left upper leg Bathing: 4: Min-Patient completes 8-9 [id number] parts or 75+ percent FIM - Upper Body Dressing/Undressing Upper body dressing/undressing steps patient completed: Thread/unthread right sleeve of pullover shirt/dresss;Thread/unthread left sleeve of pullover shirt/dress;Put head through opening of pull over shirt/dress;Pull shirt over trunk Upper body dressing/undressing: 5: Set-up assist to: Obtain clothing/put away FIM - Lower Body Dressing/Undressing Lower body dressing/undressing steps patient completed: Thread/unthread right pants leg;Thread/unthread left pants leg;Pull pants up/down Lower body dressing/undressing: 2: Max-Patient completed 25-49% of tasks FIM - Toileting Toileting steps completed by patient: Adjust clothing prior to toileting;Performs perineal hygiene;Adjust clothing after toileting Toileting: 4: Steadying assist FIM - Bed/Chair Transfer Bed/Chair Transfer: 5: Supine > Sit: Supervision (verbal cues/safety issues);4: Bed > Chair or W/C: Min A (steadying Pt. > 75%) FIM - TRadio producerDevices: Grab bars;Walker Toilet Transfers: 5-To toilet/BSC: Supervision (verbal cues/safety issues);5-From toilet/BSC: Supervision (verbal cues/safety issues)  (standing to urinate) FIM - Tub/Shower Transfers Tub/Shower Assistive Devices: Tub transfer bench;Grab bars;Walker;Walk in shower Tub/shower Transfers: 5-Into Tub/Shower: Supervision (verbal cues/safety issues);5-Out of Tub/Shower: Supervision (verbal cues/safety issues)   Refer to Care Plan for Long Term Goals  Recommendations for other services: None  Discharge Criteria: Patient will be discharged from OT if patient refuses treatment 3 consecutive times without medical reason, if treatment goals not met, if there is a change in medical status, if patient makes no progress towards goals or if patient is discharged from hospital.  The above assessment, treatment  plan, treatment alternatives and goals were discussed and mutually agreed upon: by patient  --------------------------------------------------------------------------------------------------------------------------------------------------  SESSION NOTES  Session #1 [phone] - 39 Minutes Individual Therapy Patient with 6/10 complaints of pain in left knee, RN aware Initial 1:1 occupational therapy evaluation completed. Focused skilled intervention on bed mobility, sit<>stands with RW, functional ambulation/mobility using RW, shower stall transfer onto tub transfer bench, UB/LB bathing at shower level, UB/LB dressing in sit<>stand position, overall activity tolerance/endurance, and pain management. Patient with complaints of pain in right thigh while LUE is in certain positions, patient stated doctor said it had to do with his sciatic nerve. At end of session, left patient seated in recliner with all needed items within reach.   Session #2 [id number] - 25 Minutes Individual Therapy Patient with increased pain in left knee, no rage given; RN aware Upon entering room, patient found seated in recliner. Patient with comment, "I think I over did it today". Therapist introduced hip kit including, reacher, sock aid, and long handled sponge.  Therapist also administered elastic shoe strings to patient. Patient able to don bilateral shoes with set-up assistance using reacher and elastic shoe strings. Patient then stood with RW and transferred > EOB with supervision, doffed shoes, then laid supine with supervision. Therapist donned ice pack > left knee due to patient's complaints of pain and increased edema. Left patient in supine position with all needed items within reach.   Alohilani Levenhagen 12/12/2013, 3:13 PM

## 2013-12-12 NOTE — Progress Notes (Signed)
Reviewed and in agreement with evaluation and treatment provided.  

## 2013-12-12 NOTE — Progress Notes (Signed)
Pt. Refused CPAP for tonight 12/11/13.

## 2013-12-12 NOTE — Progress Notes (Signed)
Patient information reviewed and entered into eRehab system by Syrina Wake, RN, CRRN, PPS Coordinator.  Information including medical coding and functional independence measure will be reviewed and updated through discharge.    

## 2013-12-12 NOTE — Progress Notes (Signed)
Reviewed and in agreement with treatment provided.  

## 2013-12-12 NOTE — Progress Notes (Signed)
   Subjective:  Patient reports pain as mild.    Objective:   VITALS:   Filed Vitals:   12/11/13 1700 12/11/13 2019 12/12/13 0608  BP: 139/77 142/87 134/86  Pulse: 76 78 76  Temp: 99 F (37.2 C)  98.1 F (36.7 C)  TempSrc: Oral  Oral  Resp: 17  19  SpO2: 91%  90%    Neurologically intact Neurovascular intact Sensation intact distally Intact pulses distally Dorsiflexion/Plantar flexion intact Incision: dressing C/D/I and no drainage No cellulitis present Compartment soft   Lab Results  Component Value Date   WBC 12.8* 12/08/2013   HGB 11.3* 12/08/2013   HCT 34.2* 12/08/2013   MCV 91.9 12/08/2013   PLT 203 12/08/2013     Assessment/Plan:     Problem List Items Addressed This Visit   None      DVT ppx - SCDs, ambulation, asa  WBAT left and lower extremity Patient doing very well Continue working with CIR F/u 1 week     Marianna Payment 12/12/2013, 7:05 AM [phone]

## 2013-12-12 NOTE — Progress Notes (Signed)
Physical Therapy Session Note  Patient Details  Name: Jonathan Forbes MRN: [id number] Date of Birth: 05-27-1951  Today's Date: 12/12/2013 Time: 6160-7371 Time Calculation (min): 45 min  Short Term Goals: Week 1:  PT Short Term Goal 1 (Week 1): =LTG  Skilled Therapeutic Interventions/Progress Updates:    Session consisted of pt performing gait x 200' with S (verbal cueing needed in order for proper LLE hip flexion and ankle dorsiflexion during swing phase) in order to improve pt's gait mechanics, climbing and descending 2 x 2 steps (7 inch height) and 1 x 3 steps (5 inch height) with Min A and the use of bilateral handrails (verbal cueing needed in order lead with the proper LE when climbing and descending steps). Pt then performed an activity where he was asked to perform a sit to stand transfer (focusing on standing with feet about shoulder width apart in order to have a good BOS) and then reach for a horseshoe with S and the use of a RW in order to improve pt's functional independence. Session concluded with pt performing LLE heel slides, SLR's (pt able to perform this through full ROM), and quad sets in order to improve pt's LLE strength.   Therapy Documentation Precautions:  Precautions Precautions: Knee Restrictions Weight Bearing Restrictions: Yes LLE Weight Bearing: Weight bearing as tolerated Pain:  Pt c/o 6/10-7/10 left knee pain. Pt was premedicated for pain and took rest breaks throughout this session in order to alleviate this pain. See FIM for current functional status  Therapy/Group: Individual Therapy  Okey Zelek 12/12/2013, 3:49 PM

## 2013-12-12 NOTE — Plan of Care (Signed)
Problem: Food- and Nutrition-Related Knowledge Deficit (NB-1.1) Goal: Nutrition education Formal process to instruct or train a patient/client in a skill or to impart knowledge to help patients/clients voluntarily manage or modify food choices and eating behavior to maintain or improve health. Outcome: Progressing  RD consulted for nutrition education regarding weight loss.  There is no weight on file to calculate BMI. Pt meets criteria for morbid obesity based on current BMI.  RD provided "Weight Loss Tips" handout from the Academy of [employer] Dietetics. Emphasized the importance of serving sizes and provided examples of correct portions of common foods. Discussed importance of controlled and consistent intake throughout the day. Provided examples of ways to balance meals/snacks and encouraged intake of high-fiber, whole grain complex carbohydrates. Emphasized the importance of hydration with calorie-free beverages and limiting sugar-sweetened beverages.  Pt has Meal on Sales [employer].  He feels finding healthy foods on his income is very difficult.  RD reviewed low cost, highly nutritious, and satiety-promoting foods with patients.  Encouraged pt to discuss physical activity options with physician. Teach back method used.  Expect good compliance.  Current diet order is Regular, patient is consuming approximately 100% of meals at this time. Labs and medications reviewed. No further nutrition interventions warranted at this time. RD contact information provided. If additional nutrition issues arise, please re-consult RD.  Brynda Greathouse, MS RD LDN Clinical Inpatient Dietitian Pager: [phone] Weekend/After hours pager: [phone]

## 2013-12-12 NOTE — Progress Notes (Signed)
Physical Therapy Assessment and Plan  Patient Details  Name: Jonathan Forbes MRN: [id number] Date of Birth: 1951/07/23  PT Diagnosis: Abnormality of gait, Difficulty walking, Edema, Muscle weakness, Osteoarthritis and Pain in joint Rehab Potential: Good ELOS: 5-7 days   Today's Date: 12/12/2013 Time: 9030-0923  Time Calculation (min): 60 min  Problem List:  Patient Active Problem List   Diagnosis Date Noted  . OA (osteoarthritis) of knee 12/11/2013  . Osteoarthritis of left knee 12/05/2013  . Total knee replacement status 12/05/2013    Past Medical History:  Past Medical History  Diagnosis Date  . Hypertension   . OA (osteoarthritis) of knee     Hx: of B/L knees  . Depression   . Cancer     Hx: of skin cancer  . Glaucoma     Hx; of beginning stages  . Eczema     Hx; of  . Anxiety   . Sleep apnea   . Mitral valve regurgitation     Hx; of slight   Past Surgical History:  Past Surgical History  Procedure Laterality Date  . Tumor excision      skin cancer of right side of back  . Cataract extraction w/ intraocular lens  implant, bilateral      Hx; of  . Colonoscopy      Hx; of  . Tonsillectomy    . Total knee arthroplasty Left 12/05/2013    Procedure: LEFT TOTAL KNEE ARTHROPLASTY;  Surgeon: Marianna Payment, MD;  Location: [facility];  Service: Orthopedics;  Laterality: Left;    Assessment & Plan Clinical Impression: Patient is a 63 y.o. year old male with history of HTN, morbid obesity, depression, OSA, OA left knee with failure of conservative therapy. Patient elected to undergo L-TKR on 12/05/13 by Dr. Erlinda Hong. Post op WBAT and on ASA bid for DVT prophylaxis. On 12/05/13, he was started on IV rocephin for leucocytosis and concerns of UTI. ABLA noted with Hgb down to 12.8. Therapies ongoing and MD/rehab team recommending CIR.    Patient currently requires supervision-Min A with mobility secondary to muscle weakness (primarily LLE) and muscle joint tightness,  cardiorespiratory endurance, decreased standing balance and decreased balance strategies. Pt currently needs S with all basic transfers and gait (with use of a RW) and Min A with stairs. Prior to hospitalization, patient was independent  with mobility and lived  Alone in a [place] home.  Home access is  Elevator and patient can receive occasional assistance from his son.   Patient will benefit from skilled PT intervention to maximize safe functional mobility and minimize fall risk for planned discharge home alone.  Anticipate patient will benefit from follow up [place] at discharge.  PT - End of Session Activity Tolerance: Tolerates 30+ min activity with multiple rests Endurance Deficit: Yes (Needed multiple rest breaks throughout the session) PT Assessment Rehab Potential: Good Barriers to Discharge: Decreased caregiver support Barriers to Discharge Comments: patient lives home alone PT Patient demonstrates impairments in the following area(s): Balance;Edema;Endurance;Pain;Skin Integrity PT Transfers Functional Problem(s): Bed Mobility;Bed to Chair;Car;Furniture PT Locomotion Functional Problem(s): Ambulation;Stairs PT Plan PT Intensity: Minimum of 1-2 x/day ,45 to 90 minutes PT Frequency: 5 out of 7 days PT Duration Estimated Length of Stay: 5-7 days PT Treatment/Interventions: Ambulation/gait training;Balance/vestibular training;Community reintegration;Discharge planning;Disease management/prevention;DME/adaptive equipment instruction;Functional mobility training;Neuromuscular re-education;Pain management;Patient/family education;Psychosocial support;Skin care/wound management;Stair training;Therapeutic Activities;Therapeutic Exercise;UE/LE Strength taining/ROM;UE/LE Coordination activities;Wheelchair propulsion/positioning PT Transfers Anticipated Outcome(s): Mod I ambulatory level PT Locomotion Anticipated Outcome(s): Mod I ambulatory level; S  with stairs PT Recommendation Follow Up  Recommendations: Home health PT Patient destination: Home Equipment Recommended: Rolling walker  Skilled Therapeutic Intervention Session consisted of pt performing basic stand pivot transfers with S with the use of a RW (verbal cueing needed for proper hand placement on walker), gait x 140' with S and the use of a RW (cueing need in order for pt to perform gait with proper hip flexion and ankle dorsiflexion of LLE in swing phase), bed mobility with S, and w/c propulsion from the room to the gym with Mod I in order to improve pt's functional independence. Pt showed good quad control of LLE when performing transfers and gait (no L knee buckling) and therefore it was determined that he did not need to wear his L knee immobilizer during the session. Need to attempt stairs with pt this afternoon.   PT Evaluation Precautions/Restrictions Precautions Precautions: Knee Restrictions Weight Bearing Restrictions: Yes LLE Weight Bearing: Weight bearing as tolerated Pain  Pt c/o 6/10 pain LLE pain during the session. Pt took rest breaks in order to alleviate this pain.   Home Living/Prior Functioning Home Living Available Help at Discharge: Family;Friend(s);Available PRN/intermittently;Other (Comment) (pt reports that his son can help occasionally) Type of Home: Apartment Home Access: Elevator Home Layout: One level  Lives With: Alone Prior Function Level of Independence: Independent with basic ADLs;Independent with gait;Independent with transfers  Able to Take Stairs?: Yes Driving: No Vocation: On disability Cognition Overall Cognitive Status: Within Functional Limits for tasks assessed Orientation Level: Oriented X4 Safety/Judgment: Appears intact Sensation Sensation Light Touch: Appears Intact  Motor  Motor Motor: Within Functional Limits   Trunk/Postural Assessment  Cervical Assessment Cervical Assessment: Within Functional Limits Thoracic Assessment Thoracic Assessment: Within  Functional Limits Lumbar Assessment Lumbar Assessment: Within Functional Limits Postural Control Postural Control: Within Functional Limits  Balance Balance Balance Assessed: Yes Dynamic Sitting Balance Dynamic Sitting - Level of Assistance: 6: Modified independent (Device/Increase time) Static Standing Balance Static Standing - Level of Assistance: 5: Stand by assistance (with use of RW) Dynamic Standing Balance Dynamic Standing - Level of Assistance: 5: Stand by assistance (with use of RW) Extremity Assessment  RLE Assessment RLE Assessment: Exceptions to [facility] RLE AROM (degrees) RLE Overall AROM Comments: Knee Flexion=111 degrees and Knee Extension= -2 degrees RLE Strength RLE Overall Strength Comments: MMT grades: Hip Flexion 4-/5, Knee Extension 5/5, Ankle plantarflexion and dorsiflexion 5/5 LLE Assessment LLE Assessment: Exceptions to WFL (Left TKA) LLE AROM (degrees) LLE Overall AROM Comments: Knee Flexion= 91 degrees and Knee Extension= -6 degrees LLE Strength LLE Overall Strength Comments: MMT grades: Hip flexion and Knee Extension 3-/5, Ankle Plantarflexion and Dorsiflexion 5/5  FIM:  FIM - Bed/Chair Transfer Bed/Chair Transfer Assistive Devices: Copy: 5: Supine > Sit: Supervision (verbal cues/safety issues);5: Sit > Supine: Supervision (verbal cues/safety issues);5: Bed > Chair or W/C: Supervision (verbal cues/safety issues);5: Chair or W/C > Bed: Supervision (verbal cues/safety issues) FIM - Locomotion: Wheelchair Locomotion: Wheelchair: 5: Travels 150 ft or more: maneuvers on rugs and over door sills with supervision, cueing or coaxing FIM - Locomotion: Ambulation Locomotion: Ambulation Assistive Devices: Walker - Rolling Locomotion: Ambulation: 2: Travels 50 - 149 ft with supervision/safety issues   Refer to Care Plan for Long Term Goals  Recommendations for other services: None  Discharge Criteria: Patient will be discharged from PT if  patient refuses treatment 3 consecutive times without medical reason, if treatment goals not met, if there is a change in medical status, if patient makes  no progress towards goals or if patient is discharged from hospital.  The above assessment, treatment plan, treatment alternatives and goals were discussed and mutually agreed upon: by patient  Bryony Kaman 12/12/2013, 12:03 PM

## 2013-12-13 ENCOUNTER — Inpatient Hospital Stay (HOSPITAL_COMMUNITY): Payer: Medicaid Other

## 2013-12-13 LAB — URINE CULTURE
CULTURE: NO GROWTH
Colony Count: NO GROWTH

## 2013-12-13 MED ORDER — GABAPENTIN 100 MG PO CAPS
100.0000 mg | ORAL_CAPSULE | Freq: Three times a day (TID) | ORAL | Status: DC
  Administered 2013-12-13 – 2013-12-16 (×10): 100 mg via ORAL
  Filled 2013-12-13 (×13): qty 1

## 2013-12-13 NOTE — Progress Notes (Signed)
Social Work  Social Work Assessment and Plan  Patient Details  Name: Jonathan Forbes MRN: [id number] Date of Birth: [birth year]  Today's Date: 12/13/2013  Problem List:  Patient Active Problem List   Diagnosis Date Noted  . OA (osteoarthritis) of knee 12/11/2013  . Osteoarthritis of left knee 12/05/2013  . Total knee replacement status 12/05/2013   Past Medical History:  Past Medical History  Diagnosis Date  . Hypertension   . OA (osteoarthritis) of knee     Hx: of B/L knees  . Depression   . Cancer     Hx: of skin cancer  . Glaucoma     Hx; of beginning stages  . Eczema     Hx; of  . Anxiety   . Sleep apnea   . Mitral valve regurgitation     Hx; of slight   Past Surgical History:  Past Surgical History  Procedure Laterality Date  . Tumor excision      skin cancer of right side of back  . Cataract extraction w/ intraocular lens  implant, bilateral      Hx; of  . Colonoscopy      Hx; of  . Tonsillectomy    . Total knee arthroplasty Left 12/05/2013    Procedure: LEFT TOTAL KNEE ARTHROPLASTY;  Surgeon: Marianna Payment, MD;  Location: [facility];  Service: Orthopedics;  Laterality: Left;   Social History:  reports that he has never smoked. He has never used smokeless tobacco. He reports that he drinks alcohol. He reports that he does not use illicit drugs.  Family / Support Systems Marital Status: Divorced How Long?: 15+ yrs Patient Roles: Parent Children: son, Kirby Cortese [phone]) @ (C) [phone];  pt also has an adult daughter, however, no contact Other Supports: friend, Marden Noble who assists occasionally Anticipated Caregiver: self with occasional assist from son and church members and friend Caregiver Availability: Intermittent Family Dynamics: pt describes very good relationship with son who assists him as he is able  Social History Preferred language: English Religion: Christian Cultural Background: NA Education: college Read: Yes Write: Yes Employment  Status: Unemployed Date Retired/Disabled/Unemployed: 3 yrs - SSD application still pending Freight forwarder Issues: none Guardian/Conservator: none, per MD, pt capable of making decisions on his own behalf   Abuse/Neglect Physical Abuse: Denies Verbal Abuse: Denies Sexual Abuse: Denies Exploitation of patient/patient's resources: Denies Self-Neglect: Denies  Emotional Status Pt's affect, behavior adn adjustment status: Pt very solemn and soft spoken at beginning of interview.  Eventually begins to share more information more easily with this SW and talks openly about his financial and resource support struggles over the past few years.  As we talk about some minor needs that I can assist him with (i.e. letter for landlord,  follow up with SSD) during his stay, he does brighten some.  Does note that he his happy to finally be able to have this surgery (had waited for his Medicaid to be activated.) Recent Psychosocial Issues: financial strains as unable to work for 3 yrs and limited social support overall.  While he has been able to secure a [address] apartment, he notes that the envonment his stressful and concerning for his safety at times. Pyschiatric History: pt reports he does suffer from depression and is followed privately by a psychiatrist - Sharmon Revere.  Has seen her weekly for approx one year.  Also on medication.  Feels this is stable at this time. Substance Abuse History: None  Patient / Family Perceptions,  Expectations & Goals Pt/Family understanding of illness & functional limitations: Pt with good understanding of the extent of OA in knee and need for surgery as well as current functional limitations. Premorbid pt/family roles/activities: Limited community activity primarily due to transportation needs.  Reports he has had difficutly maintaining the upkeep of his apartment due to ortho issues. Anticipated changes in roles/activities/participation: Little change  anticipated as pt has mod i goals and son and friend to resume their prior support. Pt/family expectations/goals: "I just hope I will get good enough to go home by next week"  [employer]: Other (Comment) [employer], [place] transport, Groceries on Wheels) Premorbid Home Care/DME Agencies: Other (Comment) (had HH with recent wound VAC - cannot recall agency ) Transportation available at discharge: yes, via son/ friend or Medicaid transportation Resource referrals recommended: Psychology  Discharge Planning Living Arrangements: Alone Support Systems: Children;Friends/neighbors;Church/faith community Type of Residence: Private residence Insurance Resources: Medicaid (specify county) ([place] Financial Screen Referred: No Living Expenses: Pensions consultant (subsidized) Money Management: Patient Does the patient have any problems obtaining your medications?: No Home Management: pt Patient/Family Preliminary Plans: pt plans to return to his own apartment with intermittent assist of son and friend Social Work Anticipated Follow Up Needs: HH/OP Expected length of stay: 6-7 days  Clinical Impression Pleasant, soft spoken gentleman here following TKR.  Limited support available, however, targeted mod i goals.  Connected with some community supports already and will add some resources to this.  Denies any significant emotional distress but does have h/o depression.  Will monitor.  Cairo Agostinelli 12/13/2013, 4:27 PM

## 2013-12-13 NOTE — Progress Notes (Signed)
Physical Therapy Session Note  Patient Details  Name: Jonathan Forbes MRN: [id number] Date of Birth: 1951-01-02  Today's Date: 12/13/2013 Time: 1030-1100 Time Calculation (min): 30 min  Short Term Goals: Week 1:  PT Short Term Goal 1 (Week 1): =LTG  Skilled Therapeutic Interventions/Progress Updates:    Session consisted of pt performing bed mobility with Mod I, performing sit to stand transfers with Mod I and the use of a RW and dynamic standing balance with Mod I and the use of a RW in order to use the restroom, and performing  2 x 10 of therex of LLE (Heel Slides, SLR, and glut sets) in order to improve pt LLE strength and AROM. Multiple rest breaks needed to be taken during the session due to c/o LLE fatigue.  Therapy Documentation Precautions:  Precautions Precautions: Knee Restrictions Weight Bearing Restrictions: Yes LLE Weight Bearing: Weight bearing as tolerated Pain:  Pt c/o 6/10 shooting pain in his LLE. Pt was premedicated for pain and took rest breaks when performing therex in order to help alleviate this.  See FIM for current functional status  Therapy/Group: Individual Therapy  Lakrisha Iseman 12/13/2013, 10:49 AM

## 2013-12-13 NOTE — Progress Notes (Signed)
Physical Therapy Session Note  Patient Details  Name: Jonathan Forbes MRN: [id number] Date of Birth: 20-May-1951  Today's Date: 12/13/2013 Time: 0825-0926 Time Calculation (min): 61 min  Short Term Goals: Week 1:  PT Short Term Goal 1 (Week 1): =LTG  Skilled Therapeutic Interventions/Progress Updates:    Session began with pt performing gait from pts room to the rehab gym with Mod I and the use of a RW in order to help improve pt's endurance and then performing an obstacle course with S and the use of a RW in which patient worked on stepping over objects (cueing needed to appropriately perform this task with his RW), walking on a mat (to help simulate gait on an uneven terrain) and lateral stepping with intermittent mini squats in order to help improve pt's functional independence. Pt then performed an activity where he performed a sit to stand transfer and  then squatted to pick up an object with Mod I and the use of a RW and then performed tandem walk on the parallel bars with Min A (pt needed cueing throughout in order to perform tandem walk with proper mechanics) in order to help improve pt's safety at home. Pt ended the session with working on short distance gait with Min A without the use of an AD (pt presenting with a shuffling gait pattern and decreased stride length) and performing bed mobility with Mod I.   Therapy Documentation Precautions:  Precautions Precautions: Knee Restrictions Weight Bearing Restrictions: Yes LLE Weight Bearing: Weight bearing as tolerated Pain: Pt c/o 6/10 LLE pain during the session. Pt was premedicated for pain and took rest breaks in order to help alleviate this pain.  See FIM for current functional status  Therapy/Group: Individual Therapy  Jamara Vary 12/13/2013, 10:22 AM

## 2013-12-13 NOTE — Progress Notes (Signed)
[name] Individual Statement of Services  Patient Name:  Jonathan Forbes  Date:  12/13/2013  Welcome to the [facility].  Our goal is to provide you with an individualized program based on your diagnosis and situation, designed to meet your specific needs.  With this comprehensive rehabilitation program, you will be expected to participate in at least 3 hours of rehabilitation therapies Monday-Friday, with modified therapy programming on the weekends.  Your rehabilitation program will include the following services:  Physical Therapy (PT), Occupational Therapy (OT), 24 hour per day rehabilitation nursing, Therapeutic Recreaction (TR), Case Management (Social Worker), Rehabilitation Medicine, Nutrition Services and Pharmacy Services  Weekly team conferences will be held on Tuesday to discuss your progress.  Your Social Worker will talk with you frequently to get your input and to update you on team discussions.  Team conferences with you and your family in attendance may also be held.  Expected length of stay: 5-7 days  Overall anticipated outcome: modified independent  Depending on your progress and recovery, your program may change. Your Social Worker will coordinate services and will keep you informed of any changes. Your Social Worker's name and contact numbers are listed  below.  The following services may also be recommended but are not provided by the [facility] will be made to provide these services after discharge if needed.  Arrangements include referral to agencies that provide these services.  Your insurance has been verified to be:  Medicaid Your primary doctor is:  Dr. Gery Pray  Pertinent information will be shared with your doctor and your insurance company.  Social Worker:  [place], [place] or ([phone]   Information discussed with and copy given to patient by: Lennart Pall, 12/13/2013, 3:30 PM

## 2013-12-13 NOTE — Progress Notes (Signed)
Reviewed and in agreement with treatment provided.  

## 2013-12-13 NOTE — IPOC Note (Signed)
Overall Plan of Care [facility]) Patient Details Name: Jonathan Forbes MRN: [id number] DOB: [birth year]  Admitting Diagnosis: L TKA  Hospital Problems: Active Problems:   OA (osteoarthritis) of knee     Functional Problem List: Nursing Bladder;Bowel;Edema;Medication Management;Pain;Safety;Skin Integrity  PT Balance;Edema;Endurance;Pain;Skin Integrity  OT Balance;Edema;Endurance;Pain;Safety;Skin Integrity  SLP    TR         Basic ADL's: OT Grooming;Bathing;Dressing;Toileting     Advanced  ADL's: OT Simple Meal Preparation;Laundry     Transfers: PT Bed Mobility;Bed to Chair;Car;Furniture  OT Toilet;Tub/Shower     Locomotion: PT Ambulation;Stairs     Additional Impairments: OT None  SLP        TR      Anticipated Outcomes Item Anticipated Outcome  Self Feeding independent  Swallowing      Basic self-care  mod I   Toileting  mod I   Bathroom Transfers mod I  Bowel/Bladder  cont of bowel and bladder  Transfers  Mod I ambulatory level  Locomotion  Mod I ambulatory level; S with stairs  Communication     Cognition     Pain  less than 2  Safety/Judgment  adhere to safety protocol in place   Therapy Plan: PT Intensity: Minimum of 1-2 x/day ,45 to 90 minutes PT Frequency: 5 out of 7 days PT Duration Estimated Length of Stay: 5-7 days OT Intensity: Minimum of 1-2 x/day, 45 to 90 minutes OT Frequency: 5 out of 7 days OT Duration/Estimated Length of Stay: 5-7 days         Team Interventions: Nursing Interventions Patient/Family Education;Bladder Management;Bowel Management;Disease Management/Prevention;Medication Management;Skin Care/Wound Management;Pain Management;Discharge Planning;Psychosocial Support  PT interventions Ambulation/gait training;Balance/vestibular training;Community reintegration;Discharge planning;Disease management/prevention;DME/adaptive equipment instruction;Functional mobility training;Neuromuscular re-education;Pain  management;Patient/family education;Psychosocial support;Skin care/wound management;Stair training;Therapeutic Activities;Therapeutic Exercise;UE/LE Strength taining/ROM;UE/LE Coordination activities;Wheelchair propulsion/positioning  OT Interventions Balance/vestibular training;Community reintegration;Discharge planning;DME/adaptive equipment instruction;Functional mobility training;Pain management;Patient/family education;Psychosocial support;Self Care/advanced ADL retraining;Skin care/wound managment;Therapeutic Activities;Therapeutic Exercise;UE/LE Strength taining/ROM;UE/LE Coordination activities  SLP Interventions    TR Interventions    SW/CM Interventions Discharge Planning;Psychosocial Support;Patient/Family Education    Team Discharge Planning: Destination: PT-Home ,OT- Home , SLP-  Projected Follow-up: PT-Home health PT, OT-  None, SLP-  Projected Equipment Needs: PT-Rolling walker with 5" wheels, OT- 3 in 1 bedside comode;Tub/shower bench, SLP-  Equipment Details: PT- , OT-  Patient/family involved in discharge planning: PT- Patient,  OT-Patient, SLP-   MD ELOS: 5-7 days Medical Rehab Prognosis:  Excellent Assessment: The patient has been admitted for CIR therapies. The team will be addressing, functional mobility, strength, stamina, balance, safety, adaptive techniques/equipment, self-care, bowel and bladder mgt, patient and caregiver education, knee rom, cardiopulmonary stamina, pain mgt.. Goals have been set at mod I for mobility and self-care.    Meredith Staggers, MD, FAAPMR      See Team Conference Notes for weekly updates to the plan of care

## 2013-12-13 NOTE — Progress Notes (Signed)
Subjective/Complaints: Sore from therapies yesterday. Right leg/thigh pain coming and going. Breathing better A 12 point review of systems has been performed and if not noted above is otherwise negative.   Objective: Vital Signs: Blood pressure 166/98, pulse 77, temperature 98.6 F (37 C), temperature source Oral, resp. rate 18, SpO2 93.00%. No results found.  Recent Labs  12/12/13 0708  WBC 7.6  HGB 11.5*  HCT 34.5*  PLT 236    Recent Labs  12/12/13 0708  NA 139  K 4.2  CL 100  GLUCOSE 121*  BUN 12  CREATININE 0.88  CALCIUM 8.8   CBG (last 3)  No results found for this basename: GLUCAP,  in the last 72 hours  Wt Readings from Last 3 Encounters:  11/29/13 157.625 kg (347 lb 8 oz)    Physical Exam:  Constitutional: He is oriented to person, place, and time. He appears well-developed and well-nourished. Nasal cannula in place.  Morbidly obese male.  HENT:  Head: Normocephalic and atraumatic.  Eyes: Conjunctivae are normal. Pupils are equal, round, and reactive to light.  Neck: Normal range of motion. Neck supple. No tracheal deviation present. No thyromegaly present.  Cardiovascular: Normal rate and regular rhythm.  No murmur heard.  Respiratory: Effort normal and breath sounds normal. He has no wheezes.  No distress GI: Soft. Bowel sounds are normal. He exhibits no distension. There is no tenderness.  Musculoskeletal:  Left knee with surgical dressing and moderate edema. Paraspinal tenderness lower spine.  Neurological: He is alert and oriented to person, place, and time. No cranial nerve deficit. Coordination normal.  Alert and appropriate. UE strength 5/5. RLE functional except for some pain inhibition with hip flexion. LLE limited by knee pain but he can lift foot off bed, ADF and APF 4+. Has persistent pain and numbness along lateral hip/thigh  Skin: Skin is warm and dry. . Chronic stasis changes noted in both distal legs. Knee incision dry and  well approximated Psychiatric: He has a normal mood and affect. His behavior is normal. Thought content normal.    Assessment/Plan: 1. Functional deficits secondary to left TKA which require 3+ hours per day of interdisciplinary therapy in a comprehensive inpatient rehab setting. Physiatrist is providing close team supervision and 24 hour management of active medical problems listed below. Physiatrist and rehab team continue to assess barriers to discharge/monitor patient progress toward functional and medical goals. FIM: FIM - Bathing Bathing Steps Patient Completed: Chest;Right Arm;Left Arm;Abdomen;Front perineal area;Buttocks;Right upper leg;Left upper leg Bathing: 4: Min-Patient completes 8-9 80f 10 parts or 75+ percent  FIM - Upper Body Dressing/Undressing Upper body dressing/undressing steps patient completed: Thread/unthread right sleeve of pullover shirt/dresss;Thread/unthread left sleeve of pullover shirt/dress;Put head through opening of pull over shirt/dress;Pull shirt over trunk Upper body dressing/undressing: 5: Set-up assist to: Obtain clothing/put away FIM - Lower Body Dressing/Undressing Lower body dressing/undressing steps patient completed: Thread/unthread right pants leg;Thread/unthread left pants leg;Pull pants up/down Lower body dressing/undressing: 2: Max-Patient completed 25-49% of tasks  FIM - Toileting Toileting steps completed by patient: Adjust clothing prior to toileting;Performs perineal hygiene;Adjust clothing after toileting Toileting: 4: Steadying assist  FIM - Radio producer Devices: Grab bars;Walker Toilet Transfers: 5-To toilet/BSC: Supervision (verbal cues/safety issues);5-From toilet/BSC: Supervision (verbal cues/safety issues) (standing to urinate)  FIM - Bed/Chair Transfer Bed/Chair Transfer Assistive Devices: Adult nurse Transfer: 5: Supine > Sit: Supervision (verbal cues/safety issues);5: Sit > Supine: Supervision  (verbal cues/safety issues);5: Bed > Chair or  W/C: Supervision (verbal cues/safety issues);5: Chair or W/C > Bed: Supervision (verbal cues/safety issues)  FIM - Locomotion: Wheelchair Locomotion: Wheelchair: 5: Travels 150 ft or more: maneuvers on rugs and over door sills with supervision, cueing or coaxing FIM - Locomotion: Ambulation Locomotion: Ambulation Assistive Devices: Walker - Rolling Locomotion: Ambulation: 5: Travels 150 ft or more with supervision/safety issues  Comprehension Comprehension Mode: Auditory Comprehension: 7-Follows complex conversation/direction: With no assist  Expression Expression Mode: Verbal Expression: 7-Expresses complex ideas: With no assist  Social Interaction Social Interaction: 7-Interacts appropriately with others - No medications needed.  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 DVT Prophylaxis/Anticoagulation: Pharmaceutical: Lovenox  2. Pain Management: On Oxycontin, ultram qid as well as prn oxycodone.   -chronic peripheral neuropathy, ?OA of feet   -gabapentin trial 3. H/o Depression Hx with panic attacks/ Mood: Continue trazodone for insomnia and Viibryd for mood. Reports realistic outlook overall without mood disruption. LCSW to follow for evaluation.  4. Neuropsych: This patient is capable of making decisions on her own behalf.  5. OSA: CPAP  6. ABLA:  hgb 11.5 7. Morbid obesity: Pressure relief measures. Will have dietician educate patient on low fat diet.  8. Leucocytosis: Likely reactive. UCS negative. Will discontinue rocephin and monitor wound/temperature curve.  9. Lumbago: Likely due to muscle strain, chronic low back pain. May be a referral component as well. continue K pad. Schedule muscle relaxants. oob of bed, improved posture, more sitting.   10. HTN: Will monitor BP with bid checks. Continue lasix, norvasc and coreg bid.  11. Likely meralgia paresthetica RLE. Gabapentin trial, positional changes.  LOS (Days) 2 A FACE TO FACE EVALUATION WAS PERFORMED  Jonathan Forbes T 12/13/2013 7:54 AM

## 2013-12-13 NOTE — Progress Notes (Signed)
Physical Therapy Session Note  Patient Details  Name: ATHOL BOLDS MRN: [id number] Date of Birth: 1951-01-06  Today's Date: 12/13/2013 Time: 2751-7001 Time Calculation (min): 57 min  Short Term Goals: Week 1:  PT Short Term Goal 1 (Week 1): =LTG  Skilled Therapeutic Interventions/Progress Updates:    Session began session performing gait from his room to the rehab gym with Mod I and the use of a RW in order for pt to improve his endurance, performing a stand pivot car transfer with S and the use of a RW(mainly for cueing on lifting legs inside the car), and climbing and descending 10 steps with S and the use of bilateral handrails (pt needed cueing on one occasion in order for him to properly place foot on a stair). During the session pt also worked on Geographical information systems officer with Mod I (showing good understanding of proper placement of RW when stepping up and down off curb), and working on dynamic standing balance on the biodex (specifically focusing on limits of stability and weight shifting) with Min guard in order to improve his safety at home. Session concluded with pt working on LE strengthening by pt performing 2 x 10 of mini squats and LAQ's with LLE.  Therapy Documentation Precautions:  Precautions Precautions: Knee Restrictions Weight Bearing Restrictions: Yes LLE Weight Bearing: Weight bearing as tolerated Pain: Pt c/o 5/10-7/10 pain in his left knee during the session. Pt was medicated for pain by the RN towards the end of the session in order to help alleviate this pain.   See FIM for current functional status  Therapy/Group: Individual Therapy  Traniece Boffa 12/13/2013, 3:27 PM

## 2013-12-13 NOTE — Progress Notes (Signed)
Occupational Therapy Session Note  Patient Details  Name: Jonathan Forbes MRN: [id number] Date of Birth: Jun 30, 1951  Today's Date: 12/13/2013 Time: 1100-1200 Time Calculation (min): 60 min  Short Term Goals: Week 1:  OT Short Term Goal 1 (Week 1): Short Term Goals = Long Term Goals  Skilled Therapeutic Interventions/Progress Updates:  ADL-retraining with focus on adapted bathing/dressing skills using AE, endurance, functional mobility using RW, and dynamic standing balance.   Patient received in his recliner, reporting pain and fatigue from physical therapy session but agreeable to ADL.   Patient received wide-based walker from physical therapist and ambulated to bathroom unassisted, completing transfer to tub bench with only supervision assist for safety with new device.   Patient completed bathing at his baseline, although using right foot to wash left foot, rather than requesting LH sponge.   Patient was re-educated on use of AE: long sponge for bathing (sponge issued) and reacher for dressing but required total assist to don TEDs and socks this session.    Therapy Documentation Precautions:  Precautions Precautions: Knee Restrictions Weight Bearing Restrictions: Yes LLE Weight Bearing: Weight bearing as tolerated  Pain: Pain Assessment Pain Assessment: 0-10 Pain Score: 6  Pain Type: Surgical pain Pain Location: Knee Pain Orientation: Left Pain Descriptors / Indicators: Throbbing Pain Frequency: Constant Pain Onset: On-going Patients Stated Pain Goal: 4 Pain Intervention(s): Medication (See eMAR)  See FIM for current functional status  Therapy/Group: Individual Therapy  Eduin Friedel 12/13/2013, 12:16 PM

## 2013-12-13 NOTE — Progress Notes (Signed)
Patient refused CPAP tonight. There Isn't a machine in the room at this time. RN aware. Explained to Patient that if they changed their mind, to just have the RN call Respiratory and we would come set them up. 

## 2013-12-14 ENCOUNTER — Inpatient Hospital Stay (HOSPITAL_COMMUNITY): Payer: Medicaid Other | Admitting: Occupational Therapy

## 2013-12-14 ENCOUNTER — Inpatient Hospital Stay (HOSPITAL_COMMUNITY): Payer: Medicaid Other

## 2013-12-14 ENCOUNTER — Encounter (HOSPITAL_COMMUNITY): Payer: Medicaid Other | Admitting: Occupational Therapy

## 2013-12-14 ENCOUNTER — Inpatient Hospital Stay (HOSPITAL_COMMUNITY): Payer: Medicaid Other | Admitting: Physical Therapy

## 2013-12-14 DIAGNOSIS — D62 Acute posthemorrhagic anemia: Secondary | ICD-10-CM | POA: Diagnosis present

## 2013-12-14 DIAGNOSIS — G5710 Meralgia paresthetica, unspecified lower limb: Secondary | ICD-10-CM | POA: Diagnosis present

## 2013-12-14 DIAGNOSIS — I10 Essential (primary) hypertension: Secondary | ICD-10-CM | POA: Diagnosis present

## 2013-12-14 DIAGNOSIS — Z96659 Presence of unspecified artificial knee joint: Secondary | ICD-10-CM

## 2013-12-14 DIAGNOSIS — F3289 Other specified depressive episodes: Secondary | ICD-10-CM

## 2013-12-14 DIAGNOSIS — M1710 Unilateral primary osteoarthritis, unspecified knee: Secondary | ICD-10-CM

## 2013-12-14 DIAGNOSIS — F329 Major depressive disorder, single episode, unspecified: Secondary | ICD-10-CM

## 2013-12-14 MED ORDER — METHOCARBAMOL 750 MG PO TABS: 750.0000 mg | ORAL_TABLET | Freq: Four times a day (QID) | ORAL | Status: DC | PRN

## 2013-12-14 MED ORDER — SENNA 8.6 MG PO TABS: 1.0000 | ORAL_TABLET | Freq: Every day | ORAL | Status: DC | PRN

## 2013-12-14 MED ORDER — ASPIRIN EC 325 MG PO TBEC: 325.0000 mg | DELAYED_RELEASE_TABLET | Freq: Two times a day (BID) | ORAL | Status: DC

## 2013-12-14 MED ORDER — OXYCODONE HCL 5 MG PO TABS: 5.0000 mg | ORAL_TABLET | Freq: Four times a day (QID) | ORAL | Status: DC | PRN

## 2013-12-14 MED ORDER — OXYCODONE HCL ER 15 MG PO T12A: 15.0000 mg | EXTENDED_RELEASE_TABLET | Freq: Two times a day (BID) | ORAL | Status: DC

## 2013-12-14 MED ORDER — GABAPENTIN 100 MG PO CAPS: 100.0000 mg | ORAL_CAPSULE | Freq: Three times a day (TID) | ORAL | Status: DC

## 2013-12-14 NOTE — Progress Notes (Signed)
Occupational Therapy Session Notes  Patient Details  Name: Jonathan Forbes MRN: [id number] Date of Birth: November 30, 1950  Today's Date: 12/14/2013  Short Term Goals: Week 1:  OT Short Term Goal 1 (Week 1): Short Term Goals = Long Term Goals  Skilled Therapeutic Interventions/Progress Updates:   Session #1 [phone] - 55 Minutes Individual Therapy No complaints of pain Upon entering room, patient found seated in recliner. Patient stood with RW and gathered all necessary items for his ADL. Patient then ambulated > ADL apartment for simulated tub/shower transfer (no actually showering here secondary to room temperature too cold). Patient transferred in/out of tub/shower at mod I level, using grab bars (patient states his shower at home has grab bars as well). Patient then ambulated back to room for actual shower in room; patient performed shower in standing position. Therapist recommending tub transfer bench for home use from a safety and endurance stand point. From here, patient completed UB/LB dressing mod I>independently; patient used reacher to assist with donning of shoes. Therapist recommends patient use hip kit at home to increase independence with LB ADLs. Patient then stood at sink to complete grooming tasks of brushing teeth and shaving, independently. Therapist worked with patient on him donning ice pack>left knee. At end of session, left patient seated in recliner with all needed items within reach.   Session #2 [id number] - 30 Minutes Individual Therapy No complaints of pain Patient found seated in recliner. Patient stood with RW and ambulated>4west for focus on laundry using unit washer and dryer, patient mod I for this. Patient then sat for seated rest break and ambulated back to 4 midwest. Patient engaged in simulated tub/shower transfer using tub transfer bench. Therapist talked with SW, recommending BSC and tub transfer bench. Patient left seated in recliner with all needed items  within reach. Plan to get patient indigent hip kit to use at home.   Precautions:  Precautions Precautions: Knee Restrictions Weight Bearing Restrictions: Yes LLE Weight Bearing: Weight bearing as tolerated  See FIM for current functional status  Sanvi Ehler 12/14/2013, 7:24 AM

## 2013-12-14 NOTE — Progress Notes (Signed)
Physical Therapy Session Note  Patient Details  Name: Jonathan Forbes MRN: [id number] Date of Birth: Jun 26, 1951  Today's Date: 12/14/2013 Time: 4801-6553 Time Calculation (min): 42 min  Short Term Goals: Week 1:  PT Short Term Goal 1 (Week 1): =LTG  Skilled Therapeutic Interventions/Progress Updates:    Session focused on Lt LE HEP: supine ankle pumps; quad set; adductor squeeze; heel slide (added sheet for self-assist); straight leg raise; hip abduction; seated long arc quads, heel slides (progressing to assisted with Rt LE). Pt performed 10 reps each with min cues from therapist. Pt provided handout of all exercises. Bathroom mobility and ambulation to/from gym with RW performed at supervision level, no overt losses of balance.   Therapy Documentation Precautions:  Precautions Precautions: Knee Restrictions Weight Bearing Restrictions: Yes LLE Weight Bearing: Weight bearing as tolerated Pain: Pain Assessment Pain Assessment: 0-10 Pain Score: 6  Pain Type: Surgical pain Pain Location: Knee Pain Orientation: Left Pain Descriptors / Indicators: Throbbing Pain Frequency: Constant Pain Onset: On-going Patients Stated Pain Goal: 4 Pain Intervention(s):Requested pain medication   See FIM for current functional status  Therapy/Group: Individual Therapy  [name] 12/14/2013, 12:19 PM

## 2013-12-14 NOTE — Discharge Summary (Signed)
Physician Discharge Summary  Patient ID: SHAMAN MUSCARELLA MRN: [id number] DOB/AGE: 05/07/1951 63 y.o.  Admit date: 12/11/2013 Discharge date: 12/19/2013  Discharge Diagnoses:  Principal Problem:   OA (osteoarthritis) of knee--s/p L-TKR Active Problems:   HTN (hypertension)   Meralgia paraesthetica   Postoperative anemia due to acute blood loss   Discharged Condition: Stable.    Labs:  Basic Metabolic Panel    Component Value Date/Time   NA 139 12/12/2013 0708   K 4.2 12/12/2013 0708   CL 100 12/12/2013 0708   CO2 29 12/12/2013 0708   GLUCOSE 121* 12/12/2013 0708   BUN 12 12/12/2013 0708   CREATININE 0.88 12/12/2013 0708   CALCIUM 8.8 12/12/2013 0708   GFRNONAA >90 12/12/2013 0708   GFRAA >90 12/12/2013 0708      CBC:    Component Value Date/Time   WBC 7.6 12/12/2013 0708   RBC 3.77* 12/12/2013 0708   HGB 11.5* 12/12/2013 0708   HCT 34.5* 12/12/2013 0708   PLT 236 12/12/2013 0708   MCV 91.5 12/12/2013 0708   MCH 30.5 12/12/2013 0708   MCHC 33.3 12/12/2013 0708   RDW 14.6 12/12/2013 0708   LYMPHSABS 2.1 12/12/2013 0708   MONOABS 0.7 12/12/2013 0708   EOSABS 0.4 12/12/2013 0708   BASOSABS 0.1 12/12/2013 0708      CBG: No results found for this basename: GLUCAP,  in the last 168 hours  Brief HPI:   Jonathan Forbes is a 63 y.o. male with history of HTN, morbid obesity, depression, OSA, OA left knee with failure of conservative therapy. Patient elected to undergo L-TKR on 12/05/13 by Dr. Erlinda Hong. Post op WBAT and on ASA bid for DVT prophylaxis. On 12/05/13, he was started on IV rocephin for leucocytosis and concerns of UTI. ABLA noted with Hgb down to 12.8. Therapies ongoing and MD/rehab team recommending CIR.    Hospital Course: CHEVON LAUFER was admitted to rehab 12/11/2013 for inpatient therapies to consist of PT, ST and OT at least three hours five days a week. Past admission physiatrist, therapy team and rehab RN have worked together to provide customized collaborative inpatient  rehab. Blood pressures have been well controlled and left knee incision has been healing well without  S/S of infection. Po intake has been good and patient has been continent of B/B. Follow up labs reveal ABLA is stable. Rocephin was d/c and follow up labs shows leucocytosis has resolved. He has been afebrile during his stay.  He was maintained on Lovenox for DVT prophylaxis during and is to continue  325 mg ASA bid for the next month. Oxycontin was added to help with pain management.  Neurontin was effective in managing meralgia paresthetica right lateral thigh.  He has made good progress and was independent at discharge. He will continue to receive HHPT by [facility] past discharge.    Rehab course: During patient's stay in rehab weekly team conferences were held to monitor patient's progress, set goals and discuss barriers to discharge. Patient has had improvement in activity tolerance, balance, postural control, as well as ability to compensate for deficits. He has progressed to being independent for ADL tasks as well as mobility with use of RW. He is able to ambulate 150 feet and  knee flexion is at 120 degrees with -2 degrees extension.    Disposition:  Stable   Diet: Regular  Special Instructions: 1. Follow up with Dr. Erlinda Hong for suture removal and post op check.     Medication  List    STOP taking these medications       oxyCODONE 5 MG immediate release tablet  Commonly known as:  Oxy IR/ROXICODONE  Replaced by:  OxyCODONE 15 mg T12a 12 hr tablet      TAKE these medications       amLODipine 10 MG tablet  Commonly known as:  NORVASC  Take 10 mg by mouth daily.     aspirin EC 325 MG tablet  Take 1 tablet (325 mg total) by mouth 2 (two) times daily. As blood thinner for a month     carvedilol 25 MG tablet  Commonly known as:  COREG  Take 25 mg by mouth 2 (two) times daily with a meal.     furosemide 40 MG tablet  Commonly known as:  LASIX  Take 40 mg by mouth daily.      gabapentin 100 MG capsule  Commonly known as:  NEURONTIN  Take 1 capsule (100 mg total) by mouth 3 (three) times daily.     lisinopril 40 MG tablet  Commonly known as:  PRINIVIL,ZESTRIL  Take 40 mg by mouth daily.     methocarbamol 750 MG tablet  Commonly known as:  ROBAXIN  Take 1 tablet (750 mg total) by mouth every 6 (six) hours as needed for muscle spasms.     OxyCODONE 15 mg T12a 12 hr tablet  Commonly known as:  OXYCONTIN  - Take 1 tablet (15 mg total) by mouth every 12 (twelve) hours. Take one pill twice a day at 8 am and 8 pm for 10 days.  - Then decrease to one daily in am till all gone.     oxyCODONE 5 MG immediate release tablet  Commonly known as:  Oxy IR/ROXICODONE  Take 1-2 tablets (5-10 mg total) by mouth every 6 (six) hours as needed for breakthrough pain.     senna 8.6 MG Tabs tablet  Commonly known as:  SENOKOT  Take 1 tablet (8.6 mg total) by mouth daily as needed for mild constipation.     traMADol 50 MG tablet  Commonly known as:  ULTRAM  Take 50 mg by mouth 4 (four) times daily.     traZODone 100 MG tablet  Commonly known as:  DESYREL  Take 100 mg by mouth at bedtime.     VIIBRYD 40 MG Tabs  Generic drug:  Vilazodone HCl  Take 40 mg by mouth daily.       Follow-up Information   Follow up with Omer Jack, MD On 12/25/2013. (@ 9:45 am)    Specialty:  Internal Medicine   Contact information:   [address] [place] [address] [phone]       Signed: Bary Leriche 12/19/2013, 12:10 PM

## 2013-12-14 NOTE — Progress Notes (Signed)
Occupational Therapy Discharge Summary  Patient Details  Name: Jonathan Forbes MRN: [id number] Date of Birth: 05-06-1951  Today's Date: 12/17/2013  Patient has met 10 of 10 long term goals due to improved activity tolerance, improved balance, postural control, ability to compensate for deficits, improved attention, improved awareness and improved coordination.  Patient to discharge at an overall Modified Independent level. No family education provided during this CIR stay secondary to patient will discharge > home alone and patient is functioning at an overall mod I level for tasks.    Reasons goals not met: n/a, all goals met at this time.   Recommendation: No additional occupational therapy recommended at this time.   Equipment: tub transfer bench and [facility] Therapist administered patient a hip kit from [employer]ent equipment.   Reasons for discharge: treatment goals met and discharge from hospital  Patient/family agrees with progress made and goals achieved: Yes  Precautions/Restrictions  Precautions Precautions: Knee  ADL - See FIM  Vision/Perception  Vision - History Visual History: Glaucoma Patient Visual Report: No change from baseline Perception Perception: Within Functional Limits Praxis Praxis: Intact   Cognition Overall Cognitive Status: Within Functional Limits for tasks assessed Arousal/Alertness: Awake/alert Orientation Level: Oriented X4 Memory: Appears intact Awareness: Appears intact Problem Solving: Appears intact Safety/Judgment: Appears intact  Sensation Sensation Light Touch: Appears Intact Additional Comments: BUEs appear intact Coordination Gross Motor Movements are Fluid and Coordinated: Yes Fine Motor Movements are Fluid and Coordinated: Yes  Motor  Motor Motor: Within Functional Limits  Trunk/Postural Assessment  Cervical Assessment Cervical Assessment: Within Functional Limits Thoracic Assessment Thoracic Assessment: Within Functional  Limits Lumbar Assessment Lumbar Assessment: Within Functional Limits Postural Control Postural Control: Within Functional Limits   Balance Balance Balance Assessed: Yes Dynamic Sitting Balance Dynamic Sitting - Level of Assistance: 6: Modified independent (Device/Increase time) Static Standing Balance Static Standing - Level of Assistance: 6: Modified independent (Device/Increase time) Dynamic Standing Balance Dynamic Standing - Level of Assistance: 6: Modified independent (Device/Increase time)  Extremity/Trunk Assessment RUE Assessment RUE Assessment: Within Functional Limits LUE Assessment LUE Assessment: Within Functional Limits  See FIM for current functional status  [name] 12/17/2013, 2:14 PM

## 2013-12-14 NOTE — Progress Notes (Signed)
Reviewed and in agreement with treatment provided.  

## 2013-12-14 NOTE — Discharge Instructions (Signed)
Inpatient Rehab Discharge Instructions  Jonathan Forbes Discharge date and time: 12/16/13   Activities/Precautions/ Functional Status: Activity: activity as tolerated Diet: regular diet Wound Care: keep wound clean and dry Functional status:  ___ No restrictions     ___ Walk up steps independently ___ 24/7 supervision/assistance   ___ Walk up steps with assistance ___ Intermittent supervision/assistance  ___ Bathe/dress independently ___ Walk with walker     ___ Bathe/dress with assistance ___ Walk Independently    ___ Shower independently ___ Walk with assistance    ___ Shower with assistance ___ No alcohol     ___ Return to work/school ________    COMMUNITY REFERRALS UPON DISCHARGE:    Home Health:   PT      RN                     Agency: [facility] Phone: [phone]   Medical Equipment/Items Ordered: rolling walker, 3n1 commode and tub bench                                                     Agency/Supplier: [facility] @ [phone]       Special Instructions:    My questions have been answered and I understand these instructions. I will adhere to these goals and the provided educational materials after my discharge from the hospital.  Patient/Caregiver Signature _______________________________ Date __________  Clinician Signature _______________________________________ Date __________  Please bring this form and your medication list with you to all your follow-up doctor's appointments.

## 2013-12-14 NOTE — Progress Notes (Signed)
Subjective/Complaints: Had a better night. Right leg feeling better. Knee strength improving A 12 point review of systems has been performed and if not noted above is otherwise negative.   Objective: Vital Signs: Blood pressure 160/75, pulse 76, temperature 98.1 F (36.7 C), temperature source Oral, resp. rate 20, SpO2 95.00%. No results found.  Recent Labs  12/12/13 0708  WBC 7.6  HGB 11.5*  HCT 34.5*  PLT 236    Recent Labs  12/12/13 0708  NA 139  K 4.2  CL 100  GLUCOSE 121*  BUN 12  CREATININE 0.88  CALCIUM 8.8   CBG (last 3)  No results found for this basename: GLUCAP,  in the last 72 hours  Wt Readings from Last 3 Encounters:  11/29/13 157.625 kg (347 lb 8 oz)    Physical Exam:  Constitutional: He is oriented to person, place, and time. He appears well-developed and well-nourished. Nasal cannula in place.  Morbidly obese male.  HENT:  Head: Normocephalic and atraumatic.  Eyes: Conjunctivae are normal. Pupils are equal, round, and reactive to light.  Neck: Normal range of motion. Neck supple. No tracheal deviation present. No thyromegaly present.  Cardiovascular: Normal rate and regular rhythm.  No murmur heard.  Respiratory: Effort normal and breath sounds normal. He has no wheezes.  No distress GI: Soft. Bowel sounds are normal. He exhibits no distension. There is no tenderness.  Musculoskeletal:  Left knee with surgical dressing and moderate edema. Paraspinal tenderness lower spine. Knee rom 90deg in sitting Neurological: He is alert and oriented to person, place, and time. No cranial nerve deficit. Coordination normal.  Alert and appropriate. UE strength 5/5. RLE functional except for some pain inhibition with hip flexion. LLE limited by knee pain but he can lift foot off bed, ADF and APF 4+. Has persistent pain and numbness along lateral hip/thigh  Skin: Skin is warm and dry. . Chronic stasis changes noted in both distal legs. Knee incision  dry and well approximated Psychiatric: He has a normal mood and affect. His behavior is normal. Thought content normal.    Assessment/Plan: 1. Functional deficits secondary to left TKA which require 3+ hours per day of interdisciplinary therapy in a comprehensive inpatient rehab setting. Physiatrist is providing close team supervision and 24 hour management of active medical problems listed below. Physiatrist and rehab team continue to assess barriers to discharge/monitor patient progress toward functional and medical goals. FIM: FIM - Bathing Bathing Steps Patient Completed: Chest;Right Arm;Left Arm;Abdomen;Front perineal area;Buttocks;Right upper leg;Left upper leg;Right lower leg (including foot);Left lower leg (including foot) Bathing: 6: Assistive device (Comment)  FIM - Upper Body Dressing/Undressing Upper body dressing/undressing steps patient completed: Thread/unthread right sleeve of pullover shirt/dresss;Thread/unthread left sleeve of pullover shirt/dress;Put head through opening of pull over shirt/dress;Pull shirt over trunk Upper body dressing/undressing: 7: Complete Independence: No helper FIM - Lower Body Dressing/Undressing Lower body dressing/undressing steps patient completed: Pull underwear up/down;Thread/unthread right pants leg;Thread/unthread left pants leg;Pull pants up/down;Thread/unthread left underwear leg;Thread/unthread right underwear leg;Don/Doff right sock;Don/Doff left sock;Don/Doff right shoe;Don/Doff left shoe Lower body dressing/undressing: 6: Assistive device (Comment)  FIM - Toileting Toileting steps completed by patient: Adjust clothing prior to toileting;Performs perineal hygiene;Adjust clothing after toileting Toileting: 6: Assistive device: No helper  FIM - Radio producer Devices: Grab bars;Walker;Elevated toilet seat;Bedside commode Toilet Transfers: 6-Assistive device: No helper  FIM - Ship broker Devices: Copy: 6: Supine > Sit: No assist;6: Sit >  Supine: No assist  FIM - Locomotion: Wheelchair Locomotion: Wheelchair: 1: Total Assistance/staff pushes wheelchair (Pt<25%) FIM - Locomotion: Ambulation Locomotion: Ambulation Assistive Devices: Walker - Rolling Locomotion: Ambulation: 1: Travels less than 50 ft with minimal assistance (Pt.>75%)  Comprehension Comprehension Mode: Auditory Comprehension: 7-Follows complex conversation/direction: With no assist  Expression Expression Mode: Verbal Expression: 7-Expresses complex ideas: With no assist  Social Interaction Social Interaction: 7-Interacts appropriately with others - No medications needed.  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 DVT Prophylaxis/Anticoagulation: Pharmaceutical: Lovenox  2. Pain Management: On Oxycontin, ultram qid as well as prn oxycodone.   -chronic peripheral neuropathy, ?OA of feet   -gabapentin trial has been helpful 3. H/o Depression Hx with panic attacks/ Mood: Continue trazodone for insomnia and Viibryd for mood. Reports realistic outlook overall without mood disruption. LCSW to follow for evaluation.  4. Neuropsych: This patient is capable of making decisions on her own behalf.  5. OSA: CPAP  6. ABLA:  hgb 11.5 7. Morbid obesity: Pressure relief measures. Will have dietician educate patient on low fat diet.  8. Leucocytosis: Likely reactive. UCS negative. Will discontinue rocephin and monitor wound/temperature curve.  9. Lumbago: Likely due to muscle strain, chronic low back pain. May be a referral component as well. continue K pad. Schedule muscle relaxants. oob of bed, improved posture, more sitting.  10. HTN: Will monitor BP with bid checks. Continue lasix, norvasc and  coreg bid.  11. Likely meralgia paresthetica RLE. Gabapentin trial helpful  LOS (Days) 3 A FACE TO FACE EVALUATION WAS PERFORMED  Jonathan Forbes T 12/14/2013 8:06 AM

## 2013-12-14 NOTE — Progress Notes (Signed)
Physical Therapy Session Note  Patient Details  Name: Jonathan Forbes MRN: [id number] Date of Birth: 1951/03/22  Today's Date: 12/14/2013 Time: 4098-1191 Time Calculation (min): 59 min  Short Term Goals: Week 1:  PT Short Term Goal 1 (Week 1): =LTG  Skilled Therapeutic Interventions/Progress Updates:    Session consisted of pt performing gait x 400' with Mod I and the use of a RW (patient demonstrating good gait speed and step through gait pattern), performing a car transfer with Mod I, climbing and descending 3 x 2 steps with S and the use of bilateral handrails, performing all stand pivot transfers with Mod I and the use of a RW, and performing bed mobility with Mod I. Session concluded with pt working on the nustep at level 4 x 10 minutes with only the use of his LE's in order to improve lower extremity strength. Pt performed well throughout the session and showed improved endurance today.   Therapy Documentation Precautions:  Precautions Precautions: Knee Restrictions Weight Bearing Restrictions: Yes LLE Weight Bearing: Weight bearing as tolerated Pain:  Pt c/o knee pain during the session. Pt took rest breaks and was repositioned in the bed at the end of the session in order to alleviate this pain.   See FIM for current functional status  Therapy/Group: Individual Therapy  Pairlee Sawtell 12/14/2013, 3:08 PM

## 2013-12-14 NOTE — Progress Notes (Signed)
Physical Therapy Discharge Summary  Patient Details  Name: Jonathan Forbes MRN: [id number] Date of Birth: 11-Nov-1950  Today's Date: 12/14/2013  Patient has met 8 of 8 long term goals due to improved activity tolerance, improved balance, increased strength, increased range of motion, decreased pain, ability to compensate for deficits, functional use of  right upper extremity, right lower extremity, left upper extremity and left lower extremity and improved coordination. Patient made good progress throughout his stay at inpatient rehab especially with his functional use of LLE and endurance. Patient to discharge at an ambulatory level Modified Independent with the use of a rolling walker. Patient to return home alone upon discharge.  Recommendation:  Patient will benefit from ongoing skilled PT services in home health setting to continue to advance safe functional mobility, address ongoing impairments in gait, transfers, dynamic standing balance, stair climbing, endurance, LLE strength and ROM, and minimize fall risk.  Equipment: Wide based rolling walker  Reasons for discharge: treatment goals met and discharge from hospital  Patient/family agrees with progress made and goals achieved: Yes  PT Discharge Precautions/Restrictions Precautions Precautions: Knee Restrictions Weight Bearing Restrictions: Yes LLE Weight Bearing: Weight bearing as tolerated  Cognition Overall Cognitive Status: Within Functional Limits for tasks assessed Arousal/Alertness: Awake/alert Orientation Level: Oriented X4 Safety/Judgment: Appears intact Sensation Sensation Light Touch: Appears Intact Motor  Motor Motor: Within Functional Limits   Trunk/Postural Assessment  Cervical Assessment Cervical Assessment: Within Functional Limits Thoracic Assessment Thoracic Assessment: Within Functional Limits Lumbar Assessment Lumbar Assessment: Within Functional Limits Postural Control Postural Control: Within  Functional Limits  Balance Balance Balance Assessed: Yes Dynamic Sitting Balance Dynamic Sitting - Level of Assistance: 6: Modified independent (Device/Increase time) Static Standing Balance Static Standing - Level of Assistance: 6: Modified independent (Device/Increase time) Dynamic Standing Balance Dynamic Standing - Level of Assistance: 6: Modified independent (Device/Increase time)  Extremity Assessment      RLE Assessment RLE Assessment: Exceptions to [facility] RLE AROM (degrees) RLE Overall AROM Comments: Knee Flexion=120 degrees and Knee Extension=-2 degrees RLE Strength RLE Overall Strength Comments: MMT grades: Hip Flexion 4-/5, Knee Extension 5/5, Ankle plantarflexion and dorsiflexion 5/5 LLE Assessment LLE Assessment: Exceptions to WFL LLE AROM (degrees) LLE Overall AROM Comments: Knee Flexion= 97 degrees and Knee Extension= -5 degrees LLE Strength LLE Overall Strength Comments: MMT grades: Hip flexion 4-/5, Knee Extension 4-/5, Ankle Plantarflexion and Dorsiflexion 5/5  See FIM for current functional status  Jonathan Forbes 12/14/2013, 2:45 PM

## 2013-12-14 NOTE — Progress Notes (Signed)
Social Work  Discharge Note  The overall goal for the admission was met for:   Discharge location: Yes - home alone with intermittent assistance from son and friend  Length of Stay: Yes - 5 days  Discharge activity level: Yes - modified independent  Home/community participation: Yes  Services provided included: MD, RD, PT, OT, RN, Pharmacy and SW  Financial Services: Medicaid  Follow-up services arranged: Home Health: RN, PT via [facility], DME: wide rolling walker, 3n1 commode and tub bench via [facility] and Patient/Family has no preference for HH/DME agencies  Comments (or additional information):  Patient/Family verbalized understanding of follow-up arrangements: Yes  Individual responsible for coordination of the follow-up plan: patient  Confirmed correct DME delivered: Jerran Tappan 12/14/2013    Keishawn Darsey

## 2013-12-15 ENCOUNTER — Inpatient Hospital Stay (HOSPITAL_COMMUNITY): Payer: Medicaid Other | Admitting: Physical Therapy

## 2013-12-15 ENCOUNTER — Inpatient Hospital Stay (HOSPITAL_COMMUNITY): Payer: Medicaid Other | Admitting: *Deleted

## 2013-12-15 NOTE — Progress Notes (Signed)
Physical Therapy Note  Patient Details  Name: MIGEL HANNIS MRN: [id number] Date of Birth: 1950/11/17 Today's Date: 12/15/2013  1000-1055 (55 minutes) individual Pain: 6/10 left knee / premedicated  Focus of treatment: therapeutic exercise focused on left knee  strengthening / AROM/ activity tolerance; gait training/ activity tolerance Treatment: gait on unit 150 feet RW SBA to modified independent  WBAT LT LE; sit >< supine independent (mat); up/down 4 steps SBA bilateral rails using correct sequencing on LEs ; therapeutic exercise X 20 quad sets LT LE, SAQs heel slides, SLRs (X 10 with good quad control ), ankle pumps, hip abduction; Nustep level 4 X 10 minutes LEs only ; returned to room with all needs within reach. Other :  Right LE grossly 5/5 ; left LE 5/5 except knee extension not manually tested (> 3/5), hip flexion 3+/ 4-/5.   1345-1425 (40 minutes) individual Pain : 6/10 left knee/ premedicated Focus of treatment: therapeutic exercise focused on AROM/ strengthening left LE/ activity tolerance; gait training WBAT left LE Treatment: Pt in bed upon arrival; supine to sit independent; gait SBA to modified independent on unit 150 + feet level surfaces; Nustep Level 4 x 10 minutes LEs only; standing left knee flexion X 10 with RW support ; sit to stand from mat without UE assist 3 X 5 for quad strengthening; returned to room with all needs within reach; pt modified independent in room.   Patryck Kilgore,JIM 12/15/2013, 10:07 AM

## 2013-12-15 NOTE — Progress Notes (Deleted)
Physical Therapy Note  Patient Details  Name: Jonathan Forbes MRN: [id number] Date of Birth: 12/22/1950 Today's Date: 12/15/2013  Time:  1130-1215  (45 min)  1st session Pain:  5/10 left knee Individual session:  Pt. Sitting in recliner Transferred to bed to don footies.  Ambulated to batheoom at mod I level.  Addressed transfers, functional mobility, endurance and safety with walker.  Pt. Ambulated down to ADL apartment.  Utilized RW to [employer] sandwich.  Required minimal instructional cues at beginning but performed at mod I level.  Pt. aware of safety using walker.  Discussed having walker bag to carry things from stove to dining areea.   2nd session:  Time: 1500-1530  (30 min) Addressed functional mobility, transfers, therapeutic activity in housekeeping duty.  Pt. Ambulated to kitchen and cleaned using RW.   Pain: Individual session    [name], [name] 12/15/2013, 11:41 AM

## 2013-12-15 NOTE — Plan of Care (Signed)
Problem: RH PAIN MANAGEMENT Goal: RH STG PAIN MANAGED AT OR BELOW PT'S PAIN GOAL Less than 4  Outcome: Not Progressing Pt reports pain of 6

## 2013-12-15 NOTE — Progress Notes (Signed)
Physical Therapy Note  Patient Details  Name: Jonathan Forbes MRN: [id number] Date of Birth: 06/18/1951 Today's Date: 12/15/2013  Time:  1130-1215  (45 min)  1st session Pain:  5/10 left knee Individual session:  Pt. Sitting in recliner Transferred to bed to don footies.  Ambulated to batheoom at mod I level.  Addressed transfers, functional mobility, endurance and safety with walker.  Pt. Ambulated down to ADL apartment.  Utilized RW to [employer] sandwich.  Required minimal instructional cues at beginning but performed at mod I level.  Pt. aware of safety using walker.  Discussed having walker bag to carry things from stove to dining areea.   2nd session:  Time: 1500-1540  (40 min) Pain:  5/10  knee Individual session  Addressed functional mobility, transfers, therapeutic activity in housekeeping duty.  Pt. Ambulated to kitchen and cleaned dishes using RW.    Pt washed dishes and stood for 15 minutes.  Pt had back pain and sat down for 5 minutes.  Pt. Completed task and walked back to room..  Attempted to give pt a walker bag but none were available.  Left pt in room with call bell in reach.          Robby, Bulkley 12/15/2013, 11:41 AM

## 2013-12-15 NOTE — Progress Notes (Signed)
Subjective/Complaints: Feeling well. Excited to be going home tomorrow.  A 12 point review of systems has been performed and if not noted above is otherwise negative.   Objective: Vital Signs: Blood pressure 132/80, pulse 82, temperature 98.3 F (36.8 C), temperature source Oral, resp. rate 20, SpO2 95.00%. No results found. No results found for this basename: WBC, HGB, HCT, PLT,  in the last 72 hours No results found for this basename: NA, K, CL, CO, GLUCOSE, BUN, CREATININE, CALCIUM,  in the last 72 hours CBG (last 3)  No results found for this basename: GLUCAP,  in the last 72 hours  Wt Readings from Last 3 Encounters:  11/29/13 157.625 kg (347 lb 8 oz)    Physical Exam:  Constitutional: He is oriented to person, place, and time. He appears well-developed and well-nourished. Nasal cannula in place.  Morbidly obese male.  HENT:  Head: Normocephalic and atraumatic.  Eyes: Conjunctivae are normal. Pupils are equal, round, and reactive to light.  Neck: Normal range of motion. Neck supple. No tracheal deviation present. No thyromegaly present.  Cardiovascular: Normal rate and regular rhythm.  No murmur heard.  Respiratory: Effort normal and breath sounds normal. He has no wheezes.  No distress GI: Soft. Bowel sounds are normal. He exhibits no distension. There is no tenderness.  Musculoskeletal:  Left knee with surgical dressing and moderate edema. Paraspinal tenderness lower spine. Knee rom 90deg in sitting Neurological: He is alert and oriented to person, place, and time. No cranial nerve deficit. Coordination normal.  Alert and appropriate. UE strength 5/5. RLE functional except for some pain inhibition with hip flexion. LLE limited by knee pain but he can lift foot off bed, ADF and APF 4+. Has persistent pain and numbness along lateral hip/thigh  Skin: Skin is warm and dry. . Chronic stasis changes noted in both distal legs. Knee incision dry and well  approximated Psychiatric: He has a normal mood and affect. His behavior is normal. Thought content normal.    Assessment/Plan: 1. Functional deficits secondary to left TKA which require 3+ hours per day of interdisciplinary therapy in a comprehensive inpatient rehab setting. Physiatrist is providing close team supervision and 24 hour management of active medical problems listed below. Physiatrist and rehab team continue to assess barriers to discharge/monitor patient progress toward functional and medical goals.  A little early for sutures to come out. Will likely have [place] RN dc next week  FIM: FIM - Bathing Bathing Steps Patient Completed: Chest;Right Arm;Left Arm;Abdomen;Front perineal area;Buttocks;Right upper leg;Left upper leg;Right lower leg (including foot);Left lower leg (including foot) Bathing: 6: Assistive device (Comment)  FIM - Upper Body Dressing/Undressing Upper body dressing/undressing steps patient completed: Thread/unthread right sleeve of pullover shirt/dresss;Thread/unthread left sleeve of pullover shirt/dress;Put head through opening of pull over shirt/dress;Pull shirt over trunk Upper body dressing/undressing: 7: Complete Independence: No helper FIM - Lower Body Dressing/Undressing Lower body dressing/undressing steps patient completed: Pull underwear up/down;Thread/unthread right pants leg;Thread/unthread left pants leg;Pull pants up/down;Thread/unthread left underwear leg;Thread/unthread right underwear leg;Don/Doff right sock;Don/Doff left sock;Don/Doff right shoe;Don/Doff left shoe Lower body dressing/undressing: 6: Assistive device (Comment)  FIM - Toileting Toileting steps completed by patient: Adjust clothing prior to toileting;Performs perineal hygiene;Adjust clothing after toileting Toileting: 6: Assistive device: No helper  FIM - Radio producer Devices: Grab bars;Walker;Elevated toilet seat;Bedside commode Toilet Transfers:  6-Assistive device: No helper  FIM - Control and instrumentation engineer Devices: Copy: 6: Sit > Supine:  No assist;6: Supine > Sit: No assist;6: Bed > Chair or W/C: No assist;6: Chair or W/C > Bed: No assist  FIM - Locomotion: Wheelchair Locomotion: Wheelchair: 0: Activity did not occur (no goal set) FIM - Locomotion: Ambulation Locomotion: Ambulation Assistive Devices: Psychologist, occupational: Ambulation: 6: Travels 150 ft or more with assistive device/no helper  Comprehension Comprehension Mode: Auditory Comprehension: 7-Follows complex conversation/direction: With no assist  Expression Expression Mode: Verbal Expression: 7-Expresses complex ideas: With no assist  Social Interaction Social Interaction: 7-Interacts appropriately with others - No medications needed.  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 DVT Prophylaxis/Anticoagulation: Pharmaceutical: Lovenox  2. Pain Management: On Oxycontin, ultram qid as well as prn oxycodone.   -chronic peripheral neuropathy, ?OA of feet   -gabapentin trial has been helpful 3. H/o Depression Hx with panic attacks/ Mood: Continue trazodone for insomnia and Viibryd for mood. Reports realistic outlook overall without mood disruption. LCSW to follow for evaluation.  4. Neuropsych: This patient is capable of making decisions on her own behalf.  5. OSA: CPAP  6. ABLA:  hgb 11.5 7. Morbid obesity: Pressure relief measures. Will have dietician educate patient on low fat diet.  8. Leucocytosis: Likely reactive. UCS negative. Will discontinue rocephin and monitor wound/temperature curve.  9. Lumbago: Likely due to muscle strain, chronic low back pain. May be a referral component as well. continue K pad. Schedule muscle  relaxants. oob of bed, improved posture, more sitting.  10. HTN: Will monitor BP with bid checks. Continue lasix, norvasc and coreg bid.  11. Likely meralgia paresthetica RLE. Gabapentin trial helpful  LOS (Days) 4 A FACE TO FACE EVALUATION WAS PERFORMED  SWARTZ,ZACHARY T 12/15/2013 8:13 AM

## 2013-12-16 NOTE — Progress Notes (Addendum)
   CARE MANAGEMENT NOTE 12/16/2013  Patient:  Jonathan Forbes, Jonathan Forbes   Account Number:  [id number]  Date Initiated:  12/16/2013  Documentation initiated by:  [facility]  Subjective/Objective Assessment:     Action/Plan:   Anticipated DC Date:  12/16/2013   Anticipated DC Plan:  [place]  CM consult      Choice offered to / List presented to:     DME arranged  3-N-1  [place]      DME agency  [place].        Status of service:  Completed, signed off Medicare Important Message given?   (If response is "NO", the following Medicare IM given date fields will be blank) Date Medicare IM given:   Date Additional Medicare IM given:    Discharge Disposition:  HOME/SELF CARE  Per UR Regulation:    If discussed at Long Length of Stay Meetings, dates discussed:    Comments:  12/16/2013 1455 Pt unable to wait for delivery of DME and requested home delivery. NCM spoke to pt and contacted [facility] DME rep for DME needed for home. Spoke to floor RN and orders entered for DME. [facility] will deliver DME to pt at home today. Jonnie Finner RN Case Mgmt phone [phone]

## 2013-12-16 NOTE — Progress Notes (Signed)
Subjective/Complaints: Sitting eob. Ready to go home. Ride will be here at noon  A 12 point review of systems has been performed and if not noted above is otherwise negative.   Objective: Vital Signs: Blood pressure 133/85, pulse 77, temperature 98 F (36.7 C), temperature source Oral, resp. rate 18, SpO2 97.00%. No results found. No results found for this basename: WBC, HGB, HCT, PLT,  in the last 72 hours No results found for this basename: NA, K, CL, CO, GLUCOSE, BUN, CREATININE, CALCIUM,  in the last 72 hours CBG (last 3)  No results found for this basename: GLUCAP,  in the last 72 hours  Wt Readings from Last 3 Encounters:  11/29/13 157.625 kg (347 lb 8 oz)    Physical Exam:  Constitutional: He is oriented to person, place, and time. He appears well-developed and well-nourished. Nasal cannula in place.  Morbidly obese male.  HENT:  Head: Normocephalic and atraumatic.  Eyes: Conjunctivae are normal. Pupils are equal, round, and reactive to light.  Neck: Normal range of motion. Neck supple. No tracheal deviation present. No thyromegaly present.  Cardiovascular: Normal rate and regular rhythm.  No murmur heard.  Respiratory: Effort normal and breath sounds normal. He has no wheezes.  No distress GI: Soft. Bowel sounds are normal. He exhibits no distension. There is no tenderness.  Musculoskeletal:  Left knee with surgical dressing and moderate edema. Paraspinal tenderness lower spine. Knee rom 90deg in sitting Neurological: He is alert and oriented to person, place, and time. No cranial nerve deficit. Coordination normal.  Alert and appropriate. UE strength 5/5. RLE functional except for some pain inhibition with hip flexion. LLE limited by knee pain but he can lift foot off bed, ADF and APF 4+. Has persistent pain and numbness along lateral hip/thigh  Skin: Skin is warm and dry. . Chronic stasis changes noted in both distal legs. Knee incision dry and well  approximated Psychiatric: He has a normal mood and affect. His behavior is normal. Thought content normal.    Assessment/Plan: 1. Functional deficits secondary to left TKA which require 3+ hours per day of interdisciplinary therapy in a comprehensive inpatient rehab setting. Physiatrist is providing close team supervision and 24 hour management of active medical problems listed below. Physiatrist and rehab team continue to assess barriers to discharge/monitor patient progress toward functional and medical goals.  DC home. Goals met. Pt very pleased with stay an progress. I will see him back over the next few weeks. Needs sutures removed from leg on wednesday  FIM: FIM - Bathing Bathing Steps Patient Completed: Chest;Right Arm;Left Arm;Abdomen;Front perineal area;Buttocks;Right upper leg;Left upper leg;Right lower leg (including foot);Left lower leg (including foot) Bathing: 6: Assistive device (Comment)  FIM - Upper Body Dressing/Undressing Upper body dressing/undressing steps patient completed: Thread/unthread right sleeve of pullover shirt/dresss;Thread/unthread left sleeve of pullover shirt/dress;Put head through opening of pull over shirt/dress;Pull shirt over trunk Upper body dressing/undressing: 7: Complete Independence: No helper FIM - Lower Body Dressing/Undressing Lower body dressing/undressing steps patient completed: Pull underwear up/down;Thread/unthread right pants leg;Thread/unthread left pants leg;Pull pants up/down;Thread/unthread left underwear leg;Thread/unthread right underwear leg;Don/Doff right sock;Don/Doff left sock;Don/Doff right shoe;Don/Doff left shoe Lower body dressing/undressing: 6: Assistive device (Comment)  FIM - Toileting Toileting steps completed by patient: Adjust clothing prior to toileting;Performs perineal hygiene;Adjust clothing after toileting Toileting: 6: Assistive device: No helper  FIM - Radio producer Devices:  Insurance account manager Transfers: 6-To toilet/ BSC;6-From toilet/BSC  FIM - Bed/Chair  Financial planner Devices: Copy: 6: Assistive device: no helper;6: Supine > Sit: No assist;6: Sit > Supine: No assist  FIM - Locomotion: Wheelchair Locomotion: Wheelchair: 0: Activity did not occur (no goal set) FIM - Locomotion: Ambulation Locomotion: Ambulation Assistive Devices: Administrator Ambulation/Gait Assistance: 6: Modified independent (Device/Increase time) Locomotion: Ambulation: 6: Travels 150 ft or more with assistive device/no helper  Comprehension Comprehension Mode: Auditory Comprehension: 7-Follows complex conversation/direction: With no assist  Expression Expression Mode: Verbal Expression: 7-Expresses complex ideas: With no assist  Social Interaction Social Interaction: 7-Interacts appropriately with others - No medications needed.  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 DVT Prophylaxis/Anticoagulation: Pharmaceutical: Lovenox  2. Pain Management: On Oxycontin, ultram qid as well as prn oxycodone.   -chronic peripheral neuropathy, ?OA of feet   -gabapentin trial has been helpful 3. H/o Depression Hx with panic attacks/ Mood: Continue trazodone for insomnia and Viibryd for mood. Reports realistic outlook overall without mood disruption. LCSW to follow for evaluation.  4. Neuropsych: This patient is capable of making decisions on her own behalf.  5. OSA: CPAP  6. ABLA:  hgb 11.5 7. Morbid obesity: Pressure relief measures. Will have dietician educate patient on low fat diet.  8. Leucocytosis: Likely reactive. UCS negative. Will discontinue rocephin and monitor wound/temperature curve.  9. Lumbago: Likely due to muscle strain, chronic low back  pain. May be a referral component as well. continue K pad. Schedule muscle relaxants. oob of bed, improved posture, more sitting.  10. HTN: Will monitor BP with bid checks. Continue lasix, norvasc and coreg bid.  11. Likely meralgia paresthetica RLE. Gabapentin trial helpful  LOS (Days) 5 A FACE TO FACE EVALUATION WAS PERFORMED  Jonathan Forbes T 12/16/2013 9:35 AM

## 2013-12-16 NOTE — Progress Notes (Signed)
Patient discharged home.  Left floor via wheelchair, escorted by nursing staff and son.  All patient belongings taken with patient.  Verbalized understanding of discharge instructions.  Patients ADL equipment was not delivered here prior to discharge and patient could not wait on [place] to return phone call to check on the status of the equipment.  Will advise home health agency to deliver equipment to residence.  VSS.  Appears to be in no immediate distress at this time.  Brita Romp, RN

## 2013-12-17 ENCOUNTER — Telehealth: Payer: Self-pay | Admitting: *Deleted

## 2013-12-17 NOTE — Progress Notes (Addendum)
Reviewed and in agreement with discharge assessment provided.  Pt was formally discharged from therapy services on 12/17/13 (note initiated by student on 12/14/13).

## 2013-12-17 NOTE — Telephone Encounter (Signed)
Notified [name] that medicaid will not approve oxycontin without having tried Morphine Sulfate ER, Opana ER, or Fentanyl.   Dr Naaman Plummer will switch him to MS Contin and he can have someone pick it up on [address] at [facility].

## 2013-12-17 NOTE — Patient Care Conference (Signed)
Inpatient RehabilitationTeam Conference and Plan of Care Update Date: 12/14/2013   Time: 12:00 PM    Patient Name: Jonathan Forbes      Medical Record Number: [id number]  Date of Birth: 1951/03/29 Sex: Male         Room/Bed: 4M10C/4M10C-01 Payor Info: Payor: MEDICAID [place] / Plan: MEDICAID OF [place] / Product Type: *No Product type* /    Admitting Diagnosis: L TKA  Admit Date/Time:  12/11/2013  4:50 PM Admission Comments: No comment available   Primary Diagnosis:  <principal problem not specified> Principal Problem: <principal problem not specified>  Patient Active Problem List   Diagnosis Date Noted  . HTN (hypertension) 12/14/2013  . Meralgia paraesthetica 12/14/2013  . Postoperative anemia due to acute blood loss 12/14/2013  . OA (osteoarthritis) of knee 12/11/2013  . Osteoarthritis of left knee 12/05/2013  . Total knee replacement status 12/05/2013    Expected Discharge Date: Expected Discharge Date: 12/16/13  Team Members Present: Physician leading conference: Dr. Alger Simons Social Worker Present: Jonathan Pall, LCSW Nurse Present: Other (comment) Jonathan Pigg, RN) PT Present: Jonathan Forbes, PT OT Present: Jonathan Forbes, OT PPS Coordinator present : Jonathan Forbes, PT     Current Status/Progress Goal Weekly Team Focus  Medical   right tka, severe OA, morbid obesity, Meralgia paresthetica, SOB  IMPROVE functional mobility and rom, exercise tolerance  increase oxygenation, pain control   Bowel/Bladder   Continent of bowel and bladder; LBM 2/20  Remain continent  Maintain current regimen   Swallow/Nutrition/ Hydration             ADL's   mod I>independent  mod I>independent  d/c planning   Mobility   mod I with RW overall  mod I with RW overall  d/c - goals met   Communication             Safety/Cognition/ Behavioral Observations  n/a         Pain   C/o pain 6/10 in left knee, and nerve pain in right hip; relieved by oral meds  < 4  Assess and treat for pain q  shift and prn   Skin   Incision to left knee closed with sutures.  No breakdown or infection noted  No new skin breakdown or infection; verbalizes and demonstrates proper incision care  Educate patient on proper skin care and infection control    Rehab Goals Patient on target to meet rehab goals: Yes *See Care Plan and progress notes for long and short-term goals.  Barriers to Discharge: shortness of breath, fatigue    Possible Resolutions to Barriers:  exercise training, build stamina    Discharge Planning/Teaching Needs:  home alone  intermittent assistance from son and friend      Team Discussion:  Pt making good gains and reaching mod i goals quickly.  No concerns.  Team feels is ready to d/c 2/22  Revisions to Treatment Plan:  None   Continued Need for Acute Rehabilitation Level of Care: The patient requires daily medical management by a physician with specialized training in physical medicine and rehabilitation for the following conditions: Daily direction of a multidisciplinary physical rehabilitation program to ensure safe treatment while eliciting the highest outcome that is of practical value to the patient.: Yes Daily medical management of patient stability for increased activity during participation in an intensive rehabilitation regime.: Yes Daily analysis of laboratory values and/or radiology reports with any subsequent need for medication adjustment of medical intervention for : Post surgical problems;Pulmonary  problems;Neurological problems  Jonathan Forbes 12/17/2013, 9:01 AM

## 2013-12-18 NOTE — Progress Notes (Signed)
Patient called to state that oxycontin denied by medicaid. Will change to MS contin 20 mg bid X 10 days. Then daily till gone # 30 pills Rx.

## 2013-12-19 ENCOUNTER — Other Ambulatory Visit: Payer: Self-pay | Admitting: *Deleted

## 2013-12-19 MED ORDER — MORPHINE SULFATE ER 15 MG PO TBCR: EXTENDED_RELEASE_TABLET | ORAL | Status: DC

## 2013-12-19 NOTE — Telephone Encounter (Signed)
I spoke with Jonathan Forbes and his rx for MS CONTIN 20 mg is not available in that strength.  It will be changed to 15 mg.  New rx printed for Dr Naaman Plummer to sign due to oxycontin not covered by insurance.  He is being changed to MS CONTIN 15 mg 1 q 12 hr #30 for 10 days [name] will pick this up in our office.

## 2013-12-26 DIAGNOSIS — Z471 Aftercare following joint replacement surgery: Secondary | ICD-10-CM

## 2013-12-26 DIAGNOSIS — F329 Major depressive disorder, single episode, unspecified: Secondary | ICD-10-CM

## 2013-12-26 DIAGNOSIS — I10 Essential (primary) hypertension: Secondary | ICD-10-CM

## 2013-12-26 DIAGNOSIS — S62339B Displaced fracture of neck of unspecified metacarpal bone, initial encounter for open fracture: Secondary | ICD-10-CM

## 2013-12-26 DIAGNOSIS — F3289 Other specified depressive episodes: Secondary | ICD-10-CM

## 2013-12-26 DIAGNOSIS — Z96659 Presence of unspecified artificial knee joint: Secondary | ICD-10-CM

## 2014-03-29 ENCOUNTER — Other Ambulatory Visit: Payer: Self-pay | Admitting: Orthopaedic Surgery

## 2014-03-29 DIAGNOSIS — M25561 Pain in right knee: Secondary | ICD-10-CM

## 2014-04-07 ENCOUNTER — Ambulatory Visit
Admission: RE | Admit: 2014-04-07 | Discharge: 2014-04-07 | Disposition: A | Discharge status: Home / Self Care | Payer: Medicare Other | Source: Ambulatory Visit | Attending: Orthopaedic Surgery | Admitting: Orthopaedic Surgery

## 2014-04-07 DIAGNOSIS — M25561 Pain in right knee: Secondary | ICD-10-CM

## 2014-04-22 ENCOUNTER — Encounter (INDEPENDENT_AMBULATORY_CARE_PROVIDER_SITE_OTHER): Payer: Self-pay | Admitting: Surgery

## 2014-04-22 ENCOUNTER — Ambulatory Visit (INDEPENDENT_AMBULATORY_CARE_PROVIDER_SITE_OTHER): Payer: Medicare Other | Admitting: Surgery

## 2014-04-22 ENCOUNTER — Other Ambulatory Visit (HOSPITAL_COMMUNITY): Payer: Self-pay | Admitting: Orthopaedic Surgery

## 2014-04-22 DIAGNOSIS — M129 Arthropathy, unspecified: Secondary | ICD-10-CM

## 2014-04-22 DIAGNOSIS — I10 Essential (primary) hypertension: Secondary | ICD-10-CM

## 2014-04-22 DIAGNOSIS — R5381 Other malaise: Secondary | ICD-10-CM

## 2014-04-22 DIAGNOSIS — R5383 Other fatigue: Secondary | ICD-10-CM

## 2014-04-22 DIAGNOSIS — Z01818 Encounter for other preprocedural examination: Secondary | ICD-10-CM

## 2014-04-22 DIAGNOSIS — M1990 Unspecified osteoarthritis, unspecified site: Secondary | ICD-10-CM

## 2014-04-22 DIAGNOSIS — G4733 Obstructive sleep apnea (adult) (pediatric): Secondary | ICD-10-CM

## 2014-04-22 NOTE — Addendum Note (Signed)
Addended by: Ivor Costa on: 04/22/2014 04:30 PM   Modules accepted: Orders

## 2014-04-22 NOTE — Progress Notes (Signed)
Chief Complaint:  BMI 47 with degenerative arthritis of the knees  History of Present Illness:  Jonathan Forbes is an 63 y.o. male who has been to one of our seminars and comes in to discuss Roux-en-Y gastric bypass and sleeve. We briefly discussed lap band but quickly 100 and on the advantages of a sleeve for someone with significant arthritis and need for NSAIDS.  I reviewed both procedures with the chart and he wants to think about the sleeve and may actually be leaning in that direction. I gave him permits on both.  As these become more debilitated with his knees his weight is: He is unable to successfully lose weight. His left knee was replaced 4 months ago and his right knee is scheduled to be replaced.  He has never had abdominal surgery  Past Medical History  Diagnosis Date  . Hypertension   . OA (osteoarthritis) of knee     Hx: of B/L knees  . Depression   . Cancer     Hx: of skin cancer  . Glaucoma     Hx; of beginning stages  . Eczema     Hx; of  . Anxiety   . Sleep apnea   . Mitral valve regurgitation     Hx; of slight    Past Surgical History  Procedure Laterality Date  . Tumor excision      skin cancer of right side of back  . Cataract extraction w/ intraocular lens  implant, bilateral      Hx; of  . Colonoscopy      Hx; of  . Tonsillectomy    . Total knee arthroplasty Left 12/05/2013    Procedure: LEFT TOTAL KNEE ARTHROPLASTY;  Surgeon: Marianna Payment, MD;  Location: [place];  Service: Orthopedics;  Laterality: Left;    Current Outpatient Prescriptions  Medication Sig Dispense Refill  . amLODipine (NORVASC) 10 MG tablet Take 10 mg by mouth daily.      . carvedilol (COREG) 25 MG tablet Take 25 mg by mouth 2 (two) times daily with a meal.      . furosemide (LASIX) 40 MG tablet Take 40 mg by mouth daily.      Marland Kitchen lisinopril (PRINIVIL,ZESTRIL) 40 MG tablet Take 40 mg by mouth daily.      . traMADol (ULTRAM) 50 MG tablet Take 50 mg by mouth 4 (four) times daily.        No current facility-administered medications for this visit.   Review of patient's allergies indicates no known allergies. Family History  Problem Relation Age of Onset  . Heart disease Father   . Hypertension Father    Social History:   reports that he has never smoked. He has never used smokeless tobacco. He reports that he drinks alcohol. He reports that he does not use illicit drugs.   REVIEW OF SYSTEMS : Positive for osteoarthritis ; otherwise negative  Physical Exam:   Blood pressure 170/90, pulse 78, temperature 97.2 F (36.2 C), resp. rate 16, height 5' 11.5" (1.816 m), weight 341 lb (154.677 kg). Body mass index is 46.9 kg/(m^2).  Gen:  WDWN WM NAD  Neurological: Alert and oriented to person, place, and time. Motor and sensory function is grossly intact  Head: Normocephalic and atraumatic.  Eyes: Conjunctivae are normal. Pupils are equal, round, and reactive to light. No scleral icterus.  Neck: Normal range of motion. Neck supple. No tracheal deviation or thyromegaly present.  Cardiovascular:  SR without murmurs or gallops.  No carotid bruits Breast:  Not palpated Respiratory: Effort normal.  No respiratory distress. No chest wall tenderness. Breath sounds normal.  No wheezes, rales or rhonchi.  Abdomen:  nontender and obese GU:  Not examined  Musculoskeletal: Normal range of motion. Extremities are nontender. No cyanosis, edema or clubbing noted Lymphadenopathy: No cervical, preauricular, postauricular or axillary adenopathy is present Skin: Skin is warm and dry. No rash noted. No diaphoresis. No erythema. No pallor. Pscyh: Normal mood and affect. Behavior is normal. Judgment and thought content normal.   LABORATORY RESULTS: No results found for this or any previous visit (from the past 48 hour(s)).   RADIOLOGY RESULTS: No results found.  Problem List: Patient Active Problem List   Diagnosis Date Noted  . OSA (obstructive sleep apnea) 04/22/2014  . HTN  (hypertension) 12/14/2013  . Meralgia paraesthetica 12/14/2013  . Postoperative anemia due to acute blood loss 12/14/2013  . OA (osteoarthritis) of knee 12/11/2013  . Osteoarthritis of left knee 12/05/2013  . Total knee replacement status 12/05/2013    Assessment & Plan: Morbid obesity with multiple comorbidities;  A good candidate for a sleeve or a Roux en Y.  He will decide which he is most interested in.      Matt B. Hassell Done, MD, [facility] [phone] beeper [phone]  04/22/2014 4:07 PM

## 2014-04-22 NOTE — Patient Instructions (Signed)
Sleeve Gastrectomy A sleeve gastrectomy is a surgery in which a large portion of the stomach is removed. After the surgery, the stomach will be a narrow tube about the size of a banana. This surgery is performed to help a person lose weight. The person loses weight because the reduced size of the stomach restricts the amount of food that the person can eat. The stomach will hold much less food than before the surgery. Also, the part of the stomach that is removed produces a hormone that causes hunger.  This surgery is done for people who have morbid obesity, defined as a body mass index (BMI) greater than 40. BMI is an estimate of body fat and is calculated from the height and weight of a person. This surgery may also be done for people with a BMI between 35 and 40 if they have other diseases, such as type 2 diabetes mellitus, obstructive sleep apnea, or heart and lung disorders (cardiopulmonary diseases).  LET YOUR HEALTH CARE PROVIDER KNOW ABOUT:  Any allergies you have.   All medicines you are taking, including vitamins, herbs, eyedrops, creams, and over-the-counter medicines.   Use of steroids (by mouth or creams).   Previous problems you or members of your family have had with the use of anesthetics.   Any blood disorders you have.   Previous surgeries you have had.   Possibility of pregnancy, if this applies.   Other health problems you have. RISKS AND COMPLICATIONS Generally, sleeve gastrectomy is a safe procedure. However, as with any procedure, complications can occur. Possible complications include:  Infection.  Bleeding.  Blood clots.  Damage to other organs or tissue.  Leakage of fluid from the stomach into the abdominal cavity (rare). BEFORE THE PROCEDURE  You may need to have blood tests and imaging tests (such as X-rays or ultrasonography) done before the day of surgery. A test to evaluate your esophagus and how it moves (esophageal manometry) may also be  done.  You may be placed on a liquid diet 2-3 weeks before the surgery.  Ask your health care provider about changing or stopping your regular medicines.  Do not eat or drink anything for at least 8 hours before the procedure.   Make plans to have someone drive you home after your hospital stay. Also arrange for someone to help you with activities during recovery. PROCEDURE  A laparoscopic technique is usually used for this surgery:  You will be given medicine to make you sleep through the procedure (general anesthetic). This medicine will be given through an intravenous (IV) access tube that is put into one of your veins.  Once you are asleep, your abdomen will be cleaned and sterilized.  Several small incisions will be made in your abdomen.  Your abdomen will be filled with air so that it expands. This gives the surgeon more room to operate and makes your organs easier to see.  A thin, lighted tube with a tiny camera on the end (laparoscope) is put through a small incision in your abdomen. The camera on the laparoscope sends a picture to a TV screen in the operating room. This gives the surgeon a good view inside the abdomen.  Hollow tubes are put through the other small incisions in your abdomen. The tools needed for the procedure are put through these tubes.  The surgeon uses staples to divide part of the stomach and then removes it through one of the incisions.  The remaining stomach may be reinforced using stitches   or surgical glue or both to prevent leakage of the stomach contents. A small tube (drain) may be placed through one of the incisions to allow extra fluid to flow from the area.  The incisions are closed with stitches, staples, or glue. AFTER THE PROCEDURE  You will be monitored closely in a recovery area. Once the anesthetic has worn off, you will likely be moved to a regular hospital room.  You will be given medicine for pain and nausea.   You may have a drain  from one of the incisions in your abdomen. If a drain is used, it may stay in place after you go home from the hospital and be removed at a follow-up appointment.   You will be encouraged to walk around several times a day. This helps prevent blood clots.  You will be started on a liquid diet the first day after your surgery. Sometimes a test is done to check for leaking before you can eat.  You will be urged to cough and do deep breathing exercises. This helps prevent a lung infection after a surgery.  You will likely need to stay in the hospital for a few days.  Document Released: 08/08/2009 Document Revised: 06/13/2013 Document Reviewed: 02/23/2013 Exit[employer] Patient Information 2015 [employer]. This information is not intended to replace advice given to you by your health care provider. Make sure you discuss any questions you have with your health care provider.  

## 2014-04-30 ENCOUNTER — Encounter (HOSPITAL_COMMUNITY): Payer: Self-pay | Admitting: Pharmacy Technician

## 2014-05-02 NOTE — Addendum Note (Signed)
Addended by: Ivor Costa on: 05/02/2014 10:28 AM   Modules accepted: Orders

## 2014-05-03 ENCOUNTER — Inpatient Hospital Stay (HOSPITAL_COMMUNITY): Admission: RE | Admit: 2014-05-03 | Payer: Medicare Other | Source: Ambulatory Visit

## 2014-05-03 NOTE — Addendum Note (Signed)
Addended by: Ivor Costa on: 05/03/2014 04:51 PM   Modules accepted: Orders

## 2014-05-05 MED ORDER — DEXTROSE 5 % IV SOLN
3.0000 g | INTRAVENOUS | Status: AC
  Filled 2014-05-05: qty 3000

## 2014-05-06 LAB — COMPREHENSIVE METABOLIC PANEL
ALT: 27 U/L (ref 0–53)
AST: 16 U/L (ref 0–37)
Albumin: 4.3 g/dL (ref 3.5–5.2)
Alkaline Phosphatase: 76 U/L (ref 39–117)
BUN: 15 mg/dL (ref 6–23)
CALCIUM: 9.3 mg/dL (ref 8.4–10.5)
CHLORIDE: 102 meq/L (ref 96–112)
CO2: 29 meq/L (ref 19–32)
CREATININE: 0.75 mg/dL (ref 0.50–1.35)
GLUCOSE: 99 mg/dL (ref 70–99)
Potassium: 4.3 mEq/L (ref 3.5–5.3)
Sodium: 137 mEq/L (ref 135–145)
Total Bilirubin: 0.6 mg/dL (ref 0.2–1.2)
Total Protein: 6.5 g/dL (ref 6.0–8.3)

## 2014-05-06 LAB — CBC WITH DIFFERENTIAL/PLATELET
Basophils Absolute: 0 10*3/uL (ref 0.0–0.1)
Basophils Relative: 0 % (ref 0–1)
EOS PCT: 3 % (ref 0–5)
Eosinophils Absolute: 0.2 10*3/uL (ref 0.0–0.7)
HEMATOCRIT: 42.7 % (ref 39.0–52.0)
HEMOGLOBIN: 14.7 g/dL (ref 13.0–17.0)
LYMPHS ABS: 2.1 10*3/uL (ref 0.7–4.0)
LYMPHS PCT: 25 % (ref 12–46)
MCH: 30 pg (ref 26.0–34.0)
MCHC: 34.4 g/dL (ref 30.0–36.0)
MCV: 87.1 fL (ref 78.0–100.0)
MONO ABS: 0.8 10*3/uL (ref 0.1–1.0)
MONOS PCT: 10 % (ref 3–12)
NEUTROS ABS: 5.1 10*3/uL (ref 1.7–7.7)
Neutrophils Relative %: 62 % (ref 43–77)
Platelets: 226 10*3/uL (ref 150–400)
RBC: 4.9 MIL/uL (ref 4.22–5.81)
RDW: 14.7 % (ref 11.5–15.5)
WBC: 8.2 10*3/uL (ref 4.0–10.5)

## 2014-05-07 LAB — TSH: TSH: 2.455 u[IU]/mL (ref 0.350–4.500)

## 2014-05-07 LAB — T4: T4, Total: 9 ug/dL (ref 5.0–12.5)

## 2014-05-09 ENCOUNTER — Encounter (HOSPITAL_COMMUNITY)
Admission: RE | Admit: 2014-05-09 | Discharge: 2014-05-09 | Disposition: A | Discharge status: Home / Self Care | Payer: Medicare Other | Source: Ambulatory Visit | Attending: Orthopaedic Surgery | Admitting: Orthopaedic Surgery

## 2014-05-09 ENCOUNTER — Encounter (HOSPITAL_COMMUNITY): Payer: Self-pay

## 2014-05-09 DIAGNOSIS — Z01812 Encounter for preprocedural laboratory examination: Secondary | ICD-10-CM | POA: Diagnosis not present

## 2014-05-09 DIAGNOSIS — Z01810 Encounter for preprocedural cardiovascular examination: Secondary | ICD-10-CM | POA: Insufficient documentation

## 2014-05-09 LAB — CBC WITH DIFFERENTIAL/PLATELET
Basophils Absolute: 0 10*3/uL (ref 0.0–0.1)
Basophils Relative: 0 % (ref 0–1)
Eosinophils Absolute: 0.3 10*3/uL (ref 0.0–0.7)
Eosinophils Relative: 3 % (ref 0–5)
HCT: 44.6 % (ref 39.0–52.0)
Hemoglobin: 14.8 g/dL (ref 13.0–17.0)
LYMPHS ABS: 2.3 10*3/uL (ref 0.7–4.0)
LYMPHS PCT: 25 % (ref 12–46)
MCH: 30.4 pg (ref 26.0–34.0)
MCHC: 33.2 g/dL (ref 30.0–36.0)
MCV: 91.6 fL (ref 78.0–100.0)
Monocytes Absolute: 0.5 10*3/uL (ref 0.1–1.0)
Monocytes Relative: 5 % (ref 3–12)
NEUTROS ABS: 6 10*3/uL (ref 1.7–7.7)
NEUTROS PCT: 67 % (ref 43–77)
PLATELETS: 219 10*3/uL (ref 150–400)
RBC: 4.87 MIL/uL (ref 4.22–5.81)
RDW: 13.9 % (ref 11.5–15.5)
WBC: 9.1 10*3/uL (ref 4.0–10.5)

## 2014-05-09 LAB — TYPE AND SCREEN
ABO/RH(D): O POS
Antibody Screen: NEGATIVE

## 2014-05-09 LAB — COMPREHENSIVE METABOLIC PANEL
ALK PHOS: 81 U/L (ref 39–117)
ALT: 25 U/L (ref 0–53)
AST: 21 U/L (ref 0–37)
Albumin: 3.9 g/dL (ref 3.5–5.2)
Anion gap: 17 — ABNORMAL HIGH (ref 5–15)
BUN: 17 mg/dL (ref 6–23)
CO2: 24 meq/L (ref 19–32)
Calcium: 10 mg/dL (ref 8.4–10.5)
Chloride: 101 mEq/L (ref 96–112)
Creatinine, Ser: 0.72 mg/dL (ref 0.50–1.35)
Glucose, Bld: 115 mg/dL — ABNORMAL HIGH (ref 70–99)
POTASSIUM: 5 meq/L (ref 3.7–5.3)
SODIUM: 142 meq/L (ref 137–147)
Total Bilirubin: 0.4 mg/dL (ref 0.3–1.2)
Total Protein: 7.1 g/dL (ref 6.0–8.3)

## 2014-05-09 LAB — URINALYSIS, ROUTINE W REFLEX MICROSCOPIC
Bilirubin Urine: NEGATIVE
GLUCOSE, UA: NEGATIVE mg/dL
Hgb urine dipstick: NEGATIVE
Ketones, ur: NEGATIVE mg/dL
Nitrite: NEGATIVE
PH: 5 (ref 5.0–8.0)
PROTEIN: NEGATIVE mg/dL
Specific Gravity, Urine: 1.017 (ref 1.005–1.030)
Urobilinogen, UA: 0.2 mg/dL (ref 0.0–1.0)

## 2014-05-09 LAB — APTT: APTT: 34 s (ref 24–37)

## 2014-05-09 LAB — URINE MICROSCOPIC-ADD ON

## 2014-05-09 LAB — VITAMIN B1: Vitamin B1 (Thiamine): 13 nmol/L (ref 8–30)

## 2014-05-09 LAB — PROTIME-INR
INR: 1.07 (ref 0.00–1.49)
Prothrombin Time: 13.9 seconds (ref 11.6–15.2)

## 2014-05-09 LAB — SURGICAL PCR SCREEN
MRSA, PCR: NEGATIVE
Staphylococcus aureus: NEGATIVE

## 2014-05-09 NOTE — Pre-Procedure Instructions (Signed)
Jonathan Forbes  05/09/2014   Your procedure is scheduled on: Wednesday, May 15, 2014  Report to [facility] Admitting at 6:30 AM.  Call this number if you have problems the morning of surgery: [phone]   Remember:   Do not eat food or drink liquids after midnight Tuesday, May 14, 2014   Take these medicines the morning of surgery with A SIP OF WATER: amLODipine (NORVASC),   carvedilol (COREG), if needed: traMADol Veatrice Bourbon) Stop taking Aspirin, vitamins, and herbal medications. Do not take any NSAIDs ie: Ibuprofen, Advil, Naproxen or any medication containing Aspirin; stop now.  Do not wear jewelry, make-up or nail polish.  Do not wear lotions, powders, or perfumes. You may wear deodorant.  Do not shave 48 hours prior to surgery. Men may shave face and neck.  Do not bring valuables to the hospital.  [facility] is not responsible for any belongings or valuables.               Contacts, dentures or bridgework may not be worn into surgery.  Leave suitcase in the car. After surgery it may be brought to your room.  For patients admitted to the hospital, discharge time is determined by your treatment team.               Patients discharged the day of surgery will not be allowed to drive home.  Name and phone number of your driver:   Special Instructions:  Special Instructions:Special Instructions: [facility] - Preparing for Surgery  Before surgery, you can play an important role.  Because skin is not sterile, your skin needs to be as free of germs as possible.  You can reduce the number of germs on you skin by washing with CHG (chlorahexidine gluconate) soap before surgery.  CHG is an antiseptic cleaner which kills germs and bonds with the skin to continue killing germs even after washing.  Please DO NOT use if you have an allergy to CHG or antibacterial soaps.  If your skin becomes reddened/irritated stop using the CHG and inform your nurse when you arrive at Short  Stay.  Do not shave (including legs and underarms) for at least 48 hours prior to the first CHG shower.  You may shave your face.  Please follow these instructions carefully:   1.  Shower with CHG Soap the night before surgery and the morning of Surgery.  2.  If you choose to wash your hair, wash your hair first as usual with your normal shampoo.  3.  After you shampoo, rinse your hair and body thoroughly to remove the Shampoo.  4.  Use CHG as you would any other liquid soap.  You can apply chg directly  to the skin and wash gently with scrungie or a clean washcloth.  5.  Apply the CHG Soap to your body ONLY FROM THE NECK DOWN.  Do not use on open wounds or open sores.  Avoid contact with your eyes, ears, mouth and genitals (private parts).  Wash genitals (private parts) with your normal soap.  6.  Wash thoroughly, paying special attention to the area where your surgery will be performed.  7.  Thoroughly rinse your body with warm water from the neck down.  8.  DO NOT shower/wash with your normal soap after using and rinsing off the CHG Soap.  9.  Pat yourself dry with a clean towel.            10.  Wear  clean pajamas.            11.  Place clean sheets on your bed the night of your first shower and do not sleep with pets.  Day of Surgery  Do not apply any lotions the morning of surgery.  Please wear clean clothes to the hospital/surgery center.   Please read over the following fact sheets that you were given: Pain Booklet, Coughing and Deep Breathing, Blood Transfusion Information, MRSA Information and Surgical Site Infection Prevention

## 2014-05-10 NOTE — Progress Notes (Signed)
Anesthesia Chart Review: Patient is a 63 year old male scheduled for right TKA on 05/15/14 by Dr. Erlinda Hong. Anesthesia type is posted for spinal, although it was also posted this way for 12/05/13 left TKA but it was done under [facility].  History includes HTN, OSA currently not on CPAP, "slight" mitral regurgitation, glaucoma, non-smoker, anxiety, depression, BCC of the back s/p excision '14, eczema, osteoarthritis, cataract extraction, left TKA 12/05/13. BMI is consistent with morbid obesity. PCP is listed as Dr. Kathlene Cote. BP at PAT was documented as 159/102.  It was not rechecked.  BP on 04/22/14 was 170/90. He is on amlodipine, carvedilol, furosemide, and lisinopril.  EKG on 05/09/14 showed: NSR, septal infarct (age undetermined). He previously had an EKG at [facility] on 08/20/13 which by my notes, showed NSR but I am unable to view it under the Media tab due to a view error.     I reviewed his chart prior to his 12/05/13 surgery.  As part of his clearance then, he underwent an echo, ABIs, and carotid duplex by cardiologist Dr. Lawson Radar. Based on those results not further cardiac testing was recommended at that time.  By notes, 11/2013 carotid study showed only mild ICA stenosis/plaquing and LE ABI/duplex studies were normal.   Echo on 11/30/13 showed overall LV systolic function is normal, EF 67-12%, diastolic filling pattern indicates impaired relaxation, mildly dilated LA, normal RV size and function.   (Records were scanned under the Media tab from encounter 12/11/13, but there is a view error so they have been re-requested.)   CXR on 12/05/13 showed: Mild cardiomegaly, without edema.  Preoperative labs noted.     As above, I reviewed his chart and records from [facility] in 11/2013.  Records were re-requested since they are not viewable under the Media tab.  If new records/tests received that were done after 11/2013 then nursing staff can have me review; otherwise based on my notes from 11/2013 and  PAT results I would anticipate that he could proceed as planned if no acute changes.  George Hugh [facility] Short Stay Center/Anesthesiology Phone [phone] 05/10/2014 2:56 PM

## 2014-05-14 ENCOUNTER — Encounter (HOSPITAL_COMMUNITY)
Admission: RE | Disposition: A | Discharge status: Home / Self Care | Payer: Self-pay | Source: Ambulatory Visit | Attending: Surgery

## 2014-05-14 ENCOUNTER — Ambulatory Visit (HOSPITAL_COMMUNITY)
Admission: RE | Admit: 2014-05-14 | Discharge: 2014-05-14 | Disposition: A | Discharge status: Home / Self Care | Payer: Medicare Other | Source: Ambulatory Visit | Attending: Surgery | Admitting: Surgery

## 2014-05-14 DIAGNOSIS — K449 Diaphragmatic hernia without obstruction or gangrene: Secondary | ICD-10-CM

## 2014-05-14 DIAGNOSIS — I38 Endocarditis, valve unspecified: Secondary | ICD-10-CM | POA: Insufficient documentation

## 2014-05-14 DIAGNOSIS — F3289 Other specified depressive episodes: Secondary | ICD-10-CM | POA: Insufficient documentation

## 2014-05-14 DIAGNOSIS — R5381 Other malaise: Secondary | ICD-10-CM

## 2014-05-14 DIAGNOSIS — M1710 Unilateral primary osteoarthritis, unspecified knee: Secondary | ICD-10-CM

## 2014-05-14 DIAGNOSIS — F329 Major depressive disorder, single episode, unspecified: Secondary | ICD-10-CM

## 2014-05-14 DIAGNOSIS — G4733 Obstructive sleep apnea (adult) (pediatric): Secondary | ICD-10-CM | POA: Insufficient documentation

## 2014-05-14 DIAGNOSIS — Z01818 Encounter for other preprocedural examination: Secondary | ICD-10-CM

## 2014-05-14 DIAGNOSIS — I10 Essential (primary) hypertension: Secondary | ICD-10-CM | POA: Insufficient documentation

## 2014-05-14 DIAGNOSIS — M129 Arthropathy, unspecified: Secondary | ICD-10-CM | POA: Insufficient documentation

## 2014-05-14 DIAGNOSIS — R5383 Other fatigue: Secondary | ICD-10-CM

## 2014-05-14 DIAGNOSIS — Z6841 Body Mass Index (BMI) 40.0 and over, adult: Secondary | ICD-10-CM

## 2014-05-14 DIAGNOSIS — M1990 Unspecified osteoarthritis, unspecified site: Secondary | ICD-10-CM

## 2014-05-14 HISTORY — PX: BREATH TEK H PYLORI: SHX5422

## 2014-05-14 SURGERY — BREATH TEST, FOR HELICOBACTER PYLORI

## 2014-05-14 MED ORDER — BUPIVACAINE LIPOSOME 1.3 % IJ SUSP
20.0000 mL | Freq: Once | INTRAMUSCULAR | Status: DC
  Filled 2014-05-14: qty 20

## 2014-05-14 NOTE — Progress Notes (Signed)
Jonathan Forbes in medical records @ [facility] card stated she did not see any cardiac studies.

## 2014-05-14 NOTE — Progress Notes (Signed)
05/14/14 0830  BREATH TEK ASSESSMENT  Referring MD Johnathan Hausen  Time of Last PO Intake 2200 (on 05/13/2014)  Baseline Breath At: 0735  Pranactin Given At: 0740  Post-Dose Breath At: 0755  Sample 1 3.3  Sample 2 2.5  Test Negative

## 2014-05-15 ENCOUNTER — Encounter (HOSPITAL_COMMUNITY): Payer: Self-pay | Admitting: Surgery

## 2014-05-15 ENCOUNTER — Inpatient Hospital Stay (HOSPITAL_COMMUNITY): Payer: Medicare Other

## 2014-05-15 ENCOUNTER — Inpatient Hospital Stay (HOSPITAL_COMMUNITY): Payer: Medicare Other | Admitting: Anesthesiology

## 2014-05-15 ENCOUNTER — Inpatient Hospital Stay (HOSPITAL_COMMUNITY)
Admission: RE | Admit: 2014-05-15 | Discharge: 2014-05-17 | DRG: 470 | Disposition: A | Discharge status: Rehabilitation Facility | Payer: Medicare Other | Source: Ambulatory Visit | Attending: Orthopaedic Surgery | Admitting: Orthopaedic Surgery

## 2014-05-15 ENCOUNTER — Encounter (HOSPITAL_COMMUNITY)
Admission: RE | Disposition: A | Discharge status: Rehabilitation Facility | Payer: Self-pay | Source: Ambulatory Visit | Attending: Orthopaedic Surgery

## 2014-05-15 ENCOUNTER — Encounter (HOSPITAL_COMMUNITY): Payer: Medicare Other | Admitting: Vascular Surgery

## 2014-05-15 DIAGNOSIS — Z8249 Family history of ischemic heart disease and other diseases of the circulatory system: Secondary | ICD-10-CM | POA: Diagnosis not present

## 2014-05-15 DIAGNOSIS — Z9849 Cataract extraction status, unspecified eye: Secondary | ICD-10-CM | POA: Diagnosis not present

## 2014-05-15 DIAGNOSIS — F411 Generalized anxiety disorder: Secondary | ICD-10-CM | POA: Diagnosis present

## 2014-05-15 DIAGNOSIS — Z7901 Long term (current) use of anticoagulants: Secondary | ICD-10-CM

## 2014-05-15 DIAGNOSIS — Z79899 Other long term (current) drug therapy: Secondary | ICD-10-CM | POA: Diagnosis not present

## 2014-05-15 DIAGNOSIS — D62 Acute posthemorrhagic anemia: Secondary | ICD-10-CM | POA: Diagnosis not present

## 2014-05-15 DIAGNOSIS — M1711 Unilateral primary osteoarthritis, right knee: Secondary | ICD-10-CM | POA: Diagnosis present

## 2014-05-15 DIAGNOSIS — IMO0002 Reserved for concepts with insufficient information to code with codable children: Secondary | ICD-10-CM | POA: Diagnosis not present

## 2014-05-15 DIAGNOSIS — M1712 Unilateral primary osteoarthritis, left knee: Secondary | ICD-10-CM | POA: Diagnosis present

## 2014-05-15 DIAGNOSIS — M1710 Unilateral primary osteoarthritis, unspecified knee: Principal | ICD-10-CM | POA: Diagnosis present

## 2014-05-15 DIAGNOSIS — D72829 Elevated white blood cell count, unspecified: Secondary | ICD-10-CM | POA: Diagnosis not present

## 2014-05-15 DIAGNOSIS — G8918 Other acute postprocedural pain: Secondary | ICD-10-CM | POA: Diagnosis not present

## 2014-05-15 DIAGNOSIS — I10 Essential (primary) hypertension: Secondary | ICD-10-CM | POA: Diagnosis present

## 2014-05-15 DIAGNOSIS — Z602 Problems related to living alone: Secondary | ICD-10-CM | POA: Diagnosis not present

## 2014-05-15 DIAGNOSIS — F329 Major depressive disorder, single episode, unspecified: Secondary | ICD-10-CM | POA: Diagnosis present

## 2014-05-15 DIAGNOSIS — Z96659 Presence of unspecified artificial knee joint: Secondary | ICD-10-CM

## 2014-05-15 DIAGNOSIS — H409 Unspecified glaucoma: Secondary | ICD-10-CM | POA: Diagnosis present

## 2014-05-15 DIAGNOSIS — K5900 Constipation, unspecified: Secondary | ICD-10-CM | POA: Diagnosis present

## 2014-05-15 DIAGNOSIS — G4733 Obstructive sleep apnea (adult) (pediatric): Secondary | ICD-10-CM | POA: Diagnosis present

## 2014-05-15 DIAGNOSIS — F3289 Other specified depressive episodes: Secondary | ICD-10-CM | POA: Diagnosis present

## 2014-05-15 DIAGNOSIS — Z5189 Encounter for other specified aftercare: Secondary | ICD-10-CM | POA: Diagnosis not present

## 2014-05-15 DIAGNOSIS — Z961 Presence of intraocular lens: Secondary | ICD-10-CM

## 2014-05-15 DIAGNOSIS — Z6841 Body Mass Index (BMI) 40.0 and over, adult: Secondary | ICD-10-CM | POA: Diagnosis not present

## 2014-05-15 DIAGNOSIS — L259 Unspecified contact dermatitis, unspecified cause: Secondary | ICD-10-CM | POA: Diagnosis present

## 2014-05-15 DIAGNOSIS — Z85828 Personal history of other malignant neoplasm of skin: Secondary | ICD-10-CM | POA: Diagnosis not present

## 2014-05-15 HISTORY — PX: TOTAL KNEE ARTHROPLASTY: SHX125

## 2014-05-15 HISTORY — DX: Unspecified malignant neoplasm of skin, unspecified: C44.90

## 2014-05-15 HISTORY — DX: Obstructive sleep apnea (adult) (pediatric): G47.33

## 2014-05-15 LAB — CBC
HEMATOCRIT: 43.1 % (ref 39.0–52.0)
Hemoglobin: 14.2 g/dL (ref 13.0–17.0)
MCH: 30.8 pg (ref 26.0–34.0)
MCHC: 32.9 g/dL (ref 30.0–36.0)
MCV: 93.5 fL (ref 78.0–100.0)
Platelets: 96 10*3/uL — ABNORMAL LOW (ref 150–400)
RBC: 4.61 MIL/uL (ref 4.22–5.81)
RDW: 13.9 % (ref 11.5–15.5)
WBC: 14.9 10*3/uL — AB (ref 4.0–10.5)

## 2014-05-15 LAB — URINE MICROSCOPIC-ADD ON

## 2014-05-15 LAB — CREATININE, SERUM
Creatinine, Ser: 0.99 mg/dL (ref 0.50–1.35)
GFR, EST NON AFRICAN AMERICAN: 86 mL/min — AB (ref >90)

## 2014-05-15 LAB — URINALYSIS, ROUTINE W REFLEX MICROSCOPIC
BILIRUBIN URINE: NEGATIVE
Glucose, UA: NEGATIVE mg/dL
HGB URINE DIPSTICK: NEGATIVE
KETONES UR: NEGATIVE mg/dL
Leukocytes, UA: NEGATIVE
Nitrite: NEGATIVE
Protein, ur: 30 mg/dL — AB
SPECIFIC GRAVITY, URINE: 1.023 (ref 1.005–1.030)
UROBILINOGEN UA: 0.2 mg/dL (ref 0.0–1.0)
pH: 5 (ref 5.0–8.0)

## 2014-05-15 SURGERY — ARTHROPLASTY, KNEE, TOTAL
Anesthesia: Regional | Site: Knee | Laterality: Right

## 2014-05-15 MED ORDER — METHOCARBAMOL 500 MG PO TABS
500.0000 mg | ORAL_TABLET | Freq: Four times a day (QID) | ORAL | Status: DC | PRN
  Administered 2014-05-15 – 2014-05-17 (×6): 500 mg via ORAL
  Filled 2014-05-15 (×7): qty 1

## 2014-05-15 MED ORDER — FENTANYL CITRATE 0.05 MG/ML IJ SOLN
INTRAMUSCULAR | Status: DC | PRN
  Administered 2014-05-15: 100 ug via INTRAVENOUS
  Administered 2014-05-15: 50 ug via INTRAVENOUS

## 2014-05-15 MED ORDER — HYDROMORPHONE HCL PF 1 MG/ML IJ SOLN
0.2500 mg | INTRAMUSCULAR | Status: DC | PRN
Start: 2014-05-15 — End: 2014-05-15
  Administered 2014-05-15 (×2): 0.5 mg via INTRAVENOUS
  Administered 2014-05-15: 0.25 mg via INTRAVENOUS
  Administered 2014-05-15: 0.5 mg via INTRAVENOUS
  Administered 2014-05-15: 0.25 mg via INTRAVENOUS

## 2014-05-15 MED ORDER — ROCURONIUM BROMIDE 50 MG/5ML IV SOLN
INTRAVENOUS | Status: AC
  Filled 2014-05-15: qty 1

## 2014-05-15 MED ORDER — CEFAZOLIN SODIUM 1-5 GM-% IV SOLN
INTRAVENOUS | Status: AC
  Filled 2014-05-15: qty 50

## 2014-05-15 MED ORDER — PROPOFOL 10 MG/ML IV BOLUS
INTRAVENOUS | Status: DC | PRN
  Administered 2014-05-15: 200 mg via INTRAVENOUS
  Administered 2014-05-15: 30 mg via INTRAVENOUS

## 2014-05-15 MED ORDER — FUROSEMIDE 40 MG PO TABS
40.0000 mg | ORAL_TABLET | Freq: Every morning | ORAL | Status: DC
  Administered 2014-05-16 – 2014-05-17 (×2): 40 mg via ORAL
  Filled 2014-05-15 (×2): qty 1

## 2014-05-15 MED ORDER — LACTATED RINGERS IV SOLN
INTRAVENOUS | Status: DC | PRN
  Administered 2014-05-15 (×2): via INTRAVENOUS

## 2014-05-15 MED ORDER — AMLODIPINE BESYLATE 10 MG PO TABS
10.0000 mg | ORAL_TABLET | Freq: Every morning | ORAL | Status: DC
  Administered 2014-05-16 – 2014-05-17 (×2): 10 mg via ORAL
  Filled 2014-05-15 (×2): qty 1

## 2014-05-15 MED ORDER — SENNOSIDES-DOCUSATE SODIUM 8.6-50 MG PO TABS: 1.0000 | ORAL_TABLET | Freq: Every evening | ORAL | Status: DC | PRN

## 2014-05-15 MED ORDER — SUCCINYLCHOLINE CHLORIDE 20 MG/ML IJ SOLN
INTRAMUSCULAR | Status: DC | PRN
  Administered 2014-05-15: 100 mg via INTRAVENOUS

## 2014-05-15 MED ORDER — NEOSTIGMINE METHYLSULFATE 10 MG/10ML IV SOLN
INTRAVENOUS | Status: DC | PRN
  Administered 2014-05-15: 3 mg via INTRAVENOUS

## 2014-05-15 MED ORDER — SODIUM CHLORIDE 0.9 % IR SOLN
Status: DC | PRN
  Administered 2014-05-15: 1000 mL

## 2014-05-15 MED ORDER — ACETAMINOPHEN 650 MG RE SUPP: 650.0000 mg | Freq: Four times a day (QID) | RECTAL | Status: DC | PRN

## 2014-05-15 MED ORDER — CIPROFLOXACIN HCL 500 MG PO TABS
500.0000 mg | ORAL_TABLET | Freq: Two times a day (BID) | ORAL | Status: DC
  Administered 2014-05-15: 500 mg via ORAL
  Filled 2014-05-15 (×2): qty 1

## 2014-05-15 MED ORDER — NEOSTIGMINE METHYLSULFATE 10 MG/10ML IV SOLN
INTRAVENOUS | Status: AC
  Filled 2014-05-15: qty 1

## 2014-05-15 MED ORDER — ARTIFICIAL TEARS OP OINT
TOPICAL_OINTMENT | OPHTHALMIC | Status: AC
  Filled 2014-05-15: qty 3.5

## 2014-05-15 MED ORDER — METHOCARBAMOL 1000 MG/10ML IJ SOLN
500.0000 mg | Freq: Four times a day (QID) | INTRAMUSCULAR | Status: DC | PRN
  Administered 2014-05-15: 500 mg via INTRAVENOUS
  Filled 2014-05-15: qty 5

## 2014-05-15 MED ORDER — CARVEDILOL 25 MG PO TABS
25.0000 mg | ORAL_TABLET | Freq: Two times a day (BID) | ORAL | Status: DC
  Administered 2014-05-15 – 2014-05-17 (×5): 25 mg via ORAL
  Filled 2014-05-15 (×7): qty 1

## 2014-05-15 MED ORDER — METOCLOPRAMIDE HCL 10 MG PO TABS: 5.0000 mg | ORAL_TABLET | Freq: Three times a day (TID) | ORAL | Status: DC | PRN

## 2014-05-15 MED ORDER — MIDAZOLAM HCL 2 MG/2ML IJ SOLN
INTRAMUSCULAR | Status: AC
  Filled 2014-05-15: qty 2

## 2014-05-15 MED ORDER — SUCCINYLCHOLINE CHLORIDE 20 MG/ML IJ SOLN
INTRAMUSCULAR | Status: AC
  Filled 2014-05-15: qty 1

## 2014-05-15 MED ORDER — ACETAMINOPHEN 500 MG PO TABS
1000.0000 mg | ORAL_TABLET | Freq: Four times a day (QID) | ORAL | Status: AC
  Administered 2014-05-15 – 2014-05-16 (×4): 1000 mg via ORAL
  Filled 2014-05-15 (×5): qty 2

## 2014-05-15 MED ORDER — EPHEDRINE SULFATE 50 MG/ML IJ SOLN
INTRAMUSCULAR | Status: AC
  Filled 2014-05-15: qty 1

## 2014-05-15 MED ORDER — MENTHOL 3 MG MT LOZG: 1.0000 | LOZENGE | OROMUCOSAL | Status: DC | PRN

## 2014-05-15 MED ORDER — DIPHENHYDRAMINE HCL 12.5 MG/5ML PO ELIX: 25.0000 mg | ORAL_SOLUTION | ORAL | Status: DC | PRN

## 2014-05-15 MED ORDER — BUPIVACAINE LIPOSOME 1.3 % IJ SUSP
INTRAMUSCULAR | Status: DC | PRN
  Administered 2014-05-15: 20 mL

## 2014-05-15 MED ORDER — 0.9 % SODIUM CHLORIDE (POUR BTL) OPTIME
TOPICAL | Status: DC | PRN
  Administered 2014-05-15: 1000 mL

## 2014-05-15 MED ORDER — POVIDONE-IODINE 10 % EX SOLN
CUTANEOUS | Status: DC | PRN
  Administered 2014-05-15: 1 via TOPICAL

## 2014-05-15 MED ORDER — ALUM & MAG HYDROXIDE-SIMETH 200-200-20 MG/5ML PO SUSP: 30.0000 mL | ORAL | Status: DC | PRN

## 2014-05-15 MED ORDER — ONDANSETRON HCL 4 MG/2ML IJ SOLN
INTRAMUSCULAR | Status: DC | PRN
  Administered 2014-05-15: 4 mg via INTRAVENOUS

## 2014-05-15 MED ORDER — SENNA 8.6 MG PO TABS
1.0000 | ORAL_TABLET | Freq: Two times a day (BID) | ORAL | Status: DC
  Administered 2014-05-15 – 2014-05-17 (×4): 8.6 mg via ORAL
  Filled 2014-05-15 (×5): qty 1

## 2014-05-15 MED ORDER — ONDANSETRON HCL 4 MG PO TABS: 4.0000 mg | ORAL_TABLET | Freq: Four times a day (QID) | ORAL | Status: DC | PRN

## 2014-05-15 MED ORDER — EPHEDRINE SULFATE 50 MG/ML IJ SOLN
INTRAMUSCULAR | Status: DC | PRN
  Administered 2014-05-15 (×2): 10 mg via INTRAVENOUS

## 2014-05-15 MED ORDER — LIDOCAINE HCL (CARDIAC) 20 MG/ML IV SOLN
INTRAVENOUS | Status: AC
Start: 2014-05-15 — End: 2014-05-15
  Filled 2014-05-15: qty 5

## 2014-05-15 MED ORDER — METOCLOPRAMIDE HCL 5 MG/ML IJ SOLN
5.0000 mg | Freq: Three times a day (TID) | INTRAMUSCULAR | Status: DC | PRN
Start: 2014-05-15 — End: 2014-05-17

## 2014-05-15 MED ORDER — PROPOFOL 10 MG/ML IV BOLUS
INTRAVENOUS | Status: AC
  Filled 2014-05-15: qty 20

## 2014-05-15 MED ORDER — GLYCOPYRROLATE 0.2 MG/ML IJ SOLN
INTRAMUSCULAR | Status: DC | PRN
  Administered 2014-05-15: .5 mg via INTRAVENOUS

## 2014-05-15 MED ORDER — HYDROMORPHONE HCL PF 1 MG/ML IJ SOLN
0.5000 mg | INTRAMUSCULAR | Status: AC | PRN
  Administered 2014-05-15 (×4): 0.5 mg via INTRAVENOUS

## 2014-05-15 MED ORDER — SORBITOL 70 % SOLN: 30.0000 mL | Freq: Every day | Status: DC | PRN

## 2014-05-15 MED ORDER — POLYETHYLENE GLYCOL 3350 17 G PO PACK: 17.0000 g | PACK | Freq: Every day | ORAL | Status: DC | PRN

## 2014-05-15 MED ORDER — ACETAMINOPHEN 325 MG PO TABS: 650.0000 mg | ORAL_TABLET | Freq: Four times a day (QID) | ORAL | Status: DC | PRN

## 2014-05-15 MED ORDER — MAGNESIUM CITRATE PO SOLN: 1.0000 | Freq: Once | ORAL | Status: AC | PRN

## 2014-05-15 MED ORDER — FENTANYL CITRATE 0.05 MG/ML IJ SOLN
INTRAMUSCULAR | Status: AC
  Filled 2014-05-15: qty 5

## 2014-05-15 MED ORDER — ONDANSETRON HCL 4 MG/2ML IJ SOLN: 4.0000 mg | Freq: Four times a day (QID) | INTRAMUSCULAR | Status: DC | PRN

## 2014-05-15 MED ORDER — ENOXAPARIN SODIUM 30 MG/0.3ML ~~LOC~~ SOLN
30.0000 mg | Freq: Two times a day (BID) | SUBCUTANEOUS | Status: DC
  Administered 2014-05-16 – 2014-05-17 (×3): 30 mg via SUBCUTANEOUS
  Filled 2014-05-15 (×5): qty 0.3

## 2014-05-15 MED ORDER — MIDAZOLAM HCL 5 MG/5ML IJ SOLN
INTRAMUSCULAR | Status: DC | PRN
  Administered 2014-05-15: 2 mg via INTRAVENOUS

## 2014-05-15 MED ORDER — ONDANSETRON HCL 4 MG/2ML IJ SOLN
INTRAMUSCULAR | Status: AC
Start: 2014-05-15 — End: 2014-05-15
  Filled 2014-05-15: qty 2

## 2014-05-15 MED ORDER — SODIUM CHLORIDE 0.9 % IV SOLN
10.0000 mg | INTRAVENOUS | Status: DC | PRN
  Administered 2014-05-15: 25 ug/min via INTRAVENOUS

## 2014-05-15 MED ORDER — SODIUM CHLORIDE 0.9 % IV SOLN
INTRAVENOUS | Status: DC
  Administered 2014-05-15 – 2014-05-16 (×3): via INTRAVENOUS

## 2014-05-15 MED ORDER — BUPIVACAINE-EPINEPHRINE (PF) 0.5% -1:200000 IJ SOLN
INTRAMUSCULAR | Status: DC | PRN
  Administered 2014-05-15: 30 mL via PERINEURAL

## 2014-05-15 MED ORDER — PHENYLEPHRINE HCL 10 MG/ML IJ SOLN
INTRAMUSCULAR | Status: DC | PRN
  Administered 2014-05-15: 80 ug via INTRAVENOUS
  Administered 2014-05-15: 120 ug via INTRAVENOUS

## 2014-05-15 MED ORDER — VILAZODONE HCL 40 MG PO TABS
40.0000 mg | ORAL_TABLET | Freq: Every day | ORAL | Status: DC
  Administered 2014-05-16 – 2014-05-17 (×2): 40 mg via ORAL
  Filled 2014-05-15 (×4): qty 1

## 2014-05-15 MED ORDER — OXYCODONE HCL ER 10 MG PO T12A
10.0000 mg | EXTENDED_RELEASE_TABLET | Freq: Two times a day (BID) | ORAL | Status: DC
  Administered 2014-05-15 – 2014-05-17 (×4): 10 mg via ORAL
  Filled 2014-05-15 (×4): qty 1

## 2014-05-15 MED ORDER — CEFAZOLIN SODIUM-DEXTROSE 2-3 GM-% IV SOLR
INTRAVENOUS | Status: AC
  Administered 2014-05-15: 3 g via INTRAVENOUS
  Filled 2014-05-15: qty 50

## 2014-05-15 MED ORDER — OXYCODONE HCL 5 MG PO TABS: 5.0000 mg | ORAL_TABLET | ORAL | Status: DC | PRN

## 2014-05-15 MED ORDER — OXYCODONE HCL 5 MG PO TABS: 5.0000 mg | ORAL_TABLET | Freq: Once | ORAL | Status: DC | PRN

## 2014-05-15 MED ORDER — LISINOPRIL 40 MG PO TABS
40.0000 mg | ORAL_TABLET | Freq: Every morning | ORAL | Status: DC
  Administered 2014-05-16 – 2014-05-17 (×2): 40 mg via ORAL
  Filled 2014-05-15 (×2): qty 1

## 2014-05-15 MED ORDER — OXYCODONE HCL 5 MG PO TABS
5.0000 mg | ORAL_TABLET | ORAL | Status: DC | PRN
  Administered 2014-05-15 – 2014-05-17 (×13): 10 mg via ORAL
  Filled 2014-05-15 (×12): qty 2

## 2014-05-15 MED ORDER — GLYCOPYRROLATE 0.2 MG/ML IJ SOLN
INTRAMUSCULAR | Status: AC
  Filled 2014-05-15: qty 2

## 2014-05-15 MED ORDER — MORPHINE SULFATE 2 MG/ML IJ SOLN
2.0000 mg | INTRAMUSCULAR | Status: DC | PRN
  Administered 2014-05-15 – 2014-05-17 (×6): 2 mg via INTRAVENOUS
  Filled 2014-05-15 (×6): qty 1

## 2014-05-15 MED ORDER — OXYCODONE HCL 5 MG PO TABS
ORAL_TABLET | ORAL | Status: AC
  Filled 2014-05-15: qty 2

## 2014-05-15 MED ORDER — HYDROMORPHONE HCL PF 1 MG/ML IJ SOLN
INTRAMUSCULAR | Status: AC
  Filled 2014-05-15: qty 2

## 2014-05-15 MED ORDER — CELECOXIB 200 MG PO CAPS
200.0000 mg | ORAL_CAPSULE | Freq: Two times a day (BID) | ORAL | Status: DC
  Administered 2014-05-15 – 2014-05-17 (×4): 200 mg via ORAL
  Filled 2014-05-15 (×5): qty 1

## 2014-05-15 MED ORDER — SODIUM CHLORIDE 0.9 % IJ SOLN
INTRAMUSCULAR | Status: AC
  Filled 2014-05-15: qty 10

## 2014-05-15 MED ORDER — ENOXAPARIN SODIUM 40 MG/0.4ML ~~LOC~~ SOLN: 40.0000 mg | Freq: Every day | SUBCUTANEOUS | Status: DC

## 2014-05-15 MED ORDER — LIDOCAINE HCL (CARDIAC) 20 MG/ML IV SOLN
INTRAVENOUS | Status: DC | PRN
  Administered 2014-05-15: 60 mg via INTRAVENOUS

## 2014-05-15 MED ORDER — OXYCODONE HCL 5 MG/5ML PO SOLN: 5.0000 mg | Freq: Once | ORAL | Status: DC | PRN

## 2014-05-15 MED ORDER — PHENOL 1.4 % MT LIQD: 1.0000 | OROMUCOSAL | Status: DC | PRN

## 2014-05-15 MED ORDER — CEFAZOLIN SODIUM-DEXTROSE 2-3 GM-% IV SOLR
2.0000 g | Freq: Four times a day (QID) | INTRAVENOUS | Status: AC
  Administered 2014-05-15 (×2): 2 g via INTRAVENOUS
  Filled 2014-05-15 (×2): qty 50

## 2014-05-15 MED ORDER — ROCURONIUM BROMIDE 100 MG/10ML IV SOLN
INTRAVENOUS | Status: DC | PRN
  Administered 2014-05-15: 40 mg via INTRAVENOUS
  Administered 2014-05-15: 10 mg via INTRAVENOUS

## 2014-05-15 SURGICAL SUPPLY — 67 items
BANDAGE ELASTIC 6 VELCRO ST LF (GAUZE/BANDAGES/DRESSINGS) ×3 IMPLANT
BANDAGE ESMARK 6X9 LF (GAUZE/BANDAGES/DRESSINGS) ×1 IMPLANT
BLADE SAG 18X100X1.27 (BLADE) ×3 IMPLANT
BLADE SAW SGTL 13.0X1.19X90.0M (BLADE) ×3 IMPLANT
BLOCK RT KIT LGNP (DISPOSABLE) ×6 IMPLANT
BNDG ESMARK 6X9 LF (GAUZE/BANDAGES/DRESSINGS) ×3
BONE CEMENT PALACOSE (Orthopedic Implant) ×6 IMPLANT
BOWL SMART MIX CTS (DISPOSABLE) ×3 IMPLANT
CAP KNEE TOTAL OXINIUM W/POLY ×3 IMPLANT
CEMENT BONE PALACOSE (Orthopedic Implant) ×2 IMPLANT
CHART STIK LABEL ×3 IMPLANT
COVER SURGICAL LIGHT HANDLE (MISCELLANEOUS) ×3 IMPLANT
CUFF TOURNIQUET SINGLE 34IN LL (TOURNIQUET CUFF) ×3 IMPLANT
CUFF TOURNIQUET SINGLE 44IN (TOURNIQUET CUFF) IMPLANT
DERMABOND ADVANCED (GAUZE/BANDAGES/DRESSINGS) ×2
DERMABOND ADVANCED .7 DNX12 (GAUZE/BANDAGES/DRESSINGS) ×1 IMPLANT
DRAPE EXTREMITY T 121X128X90 (DRAPE) ×3 IMPLANT
DRAPE INCISE IOBAN 66X45 STRL (DRAPES) ×3 IMPLANT
DRAPE ORTHO SPLIT 77X108 STRL (DRAPES) ×4
DRAPE PROXIMA HALF (DRAPES) ×3 IMPLANT
DRAPE SURG 17X11 SM STRL (DRAPES) ×6 IMPLANT
DRAPE SURG ORHT 6 SPLT 77X108 (DRAPES) ×2 IMPLANT
DRAPE U-SHAPE 47X51 STRL (DRAPES) ×3 IMPLANT
DRSG AQUACEL AG ADV 3.5X14 (GAUZE/BANDAGES/DRESSINGS) IMPLANT
DRSG PAD ABDOMINAL 8X10 ST (GAUZE/BANDAGES/DRESSINGS) ×3 IMPLANT
DURAPREP 26ML APPLICATOR (WOUND CARE) ×9 IMPLANT
ELECT CAUTERY BLADE 6.4 (BLADE) ×3 IMPLANT
ELECT REM PT RETURN 9FT ADLT (ELECTROSURGICAL) ×3
ELECTRODE REM PT RTRN 9FT ADLT (ELECTROSURGICAL) ×1 IMPLANT
EVACUATOR 1/8 PVC DRAIN (DRAIN) ×3 IMPLANT
FACESHIELD WRAPAROUND (MASK) ×6 IMPLANT
GAUZE XEROFORM 1X8 LF (GAUZE/BANDAGES/DRESSINGS) ×3 IMPLANT
GLOVE SURG SS PI 7.5 STRL IVOR (GLOVE) ×9 IMPLANT
GOWN STRL REIN XL XLG (GOWN DISPOSABLE) ×3 IMPLANT
GOWN STRL REUS W/ TWL LRG LVL3 (GOWN DISPOSABLE) ×2 IMPLANT
GOWN STRL REUS W/TWL LRG LVL3 (GOWN DISPOSABLE) ×4
HANDPIECE INTERPULSE COAX TIP (DISPOSABLE) ×2
IMMOBILIZER KNEE 22 UNIV (SOFTGOODS) IMPLANT
KIT BASIN OR (CUSTOM PROCEDURE TRAY) ×3 IMPLANT
KIT ROOM TURNOVER OR (KITS) ×3 IMPLANT
MANIFOLD NEPTUNE II (INSTRUMENTS) ×3 IMPLANT
NEEDLE SPNL 18GX3.5 QUINCKE PK (NEEDLE) ×3 IMPLANT
NS IRRIG 1000ML POUR BTL (IV SOLUTION) ×3 IMPLANT
PACK TOTAL JOINT (CUSTOM PROCEDURE TRAY) ×3 IMPLANT
PAD ARMBOARD 7.5X6 YLW CONV (MISCELLANEOUS) ×6 IMPLANT
PAD CAST 4YDX4 CTTN HI CHSV (CAST SUPPLIES) ×1 IMPLANT
PADDING CAST COTTON 4X4 STRL (CAST SUPPLIES) ×2
PADDING CAST COTTON 6X4 STRL (CAST SUPPLIES) ×3 IMPLANT
SET HNDPC FAN SPRY TIP SCT (DISPOSABLE) ×1 IMPLANT
SET PAD KNEE POSITIONER (MISCELLANEOUS) ×3 IMPLANT
SPONGE GAUZE 4X4 12PLY (GAUZE/BANDAGES/DRESSINGS) ×3 IMPLANT
SPONGE GAUZE 4X4 12PLY STER LF (GAUZE/BANDAGES/DRESSINGS) ×3 IMPLANT
STAPLER VISISTAT 35W (STAPLE) ×3 IMPLANT
SUCTION FRAZIER TIP 10 FR DISP (SUCTIONS) ×3 IMPLANT
SUT ETHILON 2 0 FS 18 (SUTURE) ×9 IMPLANT
SUT PDS AB 1 CT  36 (SUTURE) ×4
SUT PDS AB 1 CT 36 (SUTURE) ×2 IMPLANT
SUT VIC AB 1 CT1 27 (SUTURE) ×4
SUT VIC AB 1 CT1 27XBRD ANBCTR (SUTURE) ×2 IMPLANT
SUT VIC AB 2-0 CT1 27 (SUTURE) ×6
SUT VIC AB 2-0 CT1 TAPERPNT 27 (SUTURE) ×3 IMPLANT
SYR 50ML LL SCALE MARK (SYRINGE) ×3 IMPLANT
TOWEL OR 17X24 6PK STRL BLUE (TOWEL DISPOSABLE) ×3 IMPLANT
TOWEL OR 17X26 10 PK STRL BLUE (TOWEL DISPOSABLE) ×3 IMPLANT
TROCAR PIN ×12 IMPLANT
WATER STERILE IRR 1000ML POUR (IV SOLUTION) ×6 IMPLANT
WRAP KNEE MAXI GEL POST OP (GAUZE/BANDAGES/DRESSINGS) ×3 IMPLANT

## 2014-05-15 NOTE — Transfer of Care (Signed)
Immediate Anesthesia Transfer of Care Note  Patient: Jonathan Forbes  Procedure(s) Performed: Procedure(s): RIGHT TOTAL KNEE ARTHROPLASTY (Right)  Patient Location: PACU  Anesthesia Type:General  Level of Consciousness: awake, alert  and oriented  Airway & Oxygen Therapy: Patient Spontanous Breathing and Patient connected to face mask oxygen  Post-op Assessment: Report given to PACU RN, Post -op Vital signs reviewed and stable and Patient moving all extremities X 4  Post vital signs: Reviewed and stable  Complications: No apparent anesthesia complications

## 2014-05-15 NOTE — H&P (Signed)
PREOPERATIVE H&P  Chief Complaint: Right knee osteoarthritis  HPI: Jonathan Forbes is a 63 y.o. male who presents for surgical treatment of Right knee osteoarthritis.  He denies any changes in medical history.  Past Medical History  Diagnosis Date  . Hypertension   . OA (osteoarthritis) of knee     Hx: of B/L knees  . Depression   . Cancer     Hx: of skin cancer  . Glaucoma     Hx; of beginning stages  . Eczema     Hx; of  . Anxiety   . Mitral valve regurgitation     Hx; of slight  . Sleep apnea     not using CPAP at present, will have a sleep study in August  . Heart murmur    Past Surgical History  Procedure Laterality Date  . Tumor excision      skin cancer of right side of back  . Cataract extraction w/ intraocular lens  implant, bilateral      Hx; of  . Colonoscopy      Hx; of  . Tonsillectomy    . Total knee arthroplasty Left 12/05/2013    Procedure: LEFT TOTAL KNEE ARTHROPLASTY;  Surgeon: Marianna Payment, MD;  Location: [facility];  Service: Orthopedics;  Laterality: Left;  Marland Kitchen Eye surgery    . Joint replacement Left 2015  . Breath tek h pylori N/A 05/14/2014    Procedure: BREATH TEK H PYLORI;  Surgeon: Pedro Earls, MD;  Location: [facility];  Service: General;  Laterality: N/A;   History   Social History  . Marital Status: Divorced    Spouse Name: N/A    Number of Children: N/A  . Years of Education: N/A   Social History Main Topics  . Smoking status: Never Smoker   . Smokeless tobacco: Never Used  . Alcohol Use: Yes     Comment: rare  . Drug Use: No  . Sexual Activity: None   Other Topics Concern  . None   Social History Narrative  . None   Family History  Problem Relation Age of Onset  . Heart disease Father   . Hypertension Father    No Known Allergies Prior to Admission medications   Medication Sig Start Date End Date Taking? Authorizing Provider  amLODipine (NORVASC) 10 MG tablet Take 10 mg by mouth every morning.    Yes Historical  Provider, MD  carvedilol (COREG) 25 MG tablet Take 25 mg by mouth 2 (two) times daily with a meal.   Yes Historical Provider, MD  furosemide (LASIX) 40 MG tablet Take 40 mg by mouth every morning.    Yes Historical Provider, MD  lisinopril (PRINIVIL,ZESTRIL) 40 MG tablet Take 40 mg by mouth every morning.    Yes Historical Provider, MD  traMADol (ULTRAM) 50 MG tablet Take 50 mg by mouth 4 (four) times daily.   Yes Historical Provider, MD  Vilazodone HCl (VIIBRYD) 40 MG TABS Take 40 mg by mouth daily.   Yes Historical Provider, MD     Positive ROS: All other systems have been reviewed and were otherwise negative with the exception of those mentioned in the HPI and as above.  Physical Exam: General: Alert, no acute distress Cardiovascular: No pedal edema Respiratory: No cyanosis, no use of accessory musculature GI: No organomegaly, abdomen is soft and non-tender Skin: No lesions in the area of chief complaint Neurologic: Sensation intact distally Psychiatric: Patient is competent for consent with normal mood and  affect Lymphatic: No axillary or cervical lymphadenopathy  MUSCULOSKELETAL:  - skin benign - no lesions around planned surgery  Assessment: Right knee osteoarthritis  Plan: Plan for Procedure(s): RIGHT TOTAL KNEE ARTHROPLASTY  The risks benefits and alternatives were discussed with the patient including but not limited to the risks of nonoperative treatment, versus surgical intervention including infection, bleeding, nerve injury,  blood clots, cardiopulmonary complications, morbidity, mortality, among others, and they were willing to proceed.   Marianna Payment, MD   05/15/2014 7:37 AM

## 2014-05-15 NOTE — Progress Notes (Signed)
Patient arrived with foley cath, clean dry and intact, bag below bladder, no dependent loops, draining amber urine

## 2014-05-15 NOTE — Progress Notes (Signed)
Orthopedic Tech Progress Note Patient Details:  Jonathan Forbes 23-Jan-1951 [id number]  Ortho Devices Type of Ortho Device: Knee Immobilizer Ortho Device/Splint Interventions: Application   Cammer, Theodoro Parma 05/15/2014, 1:03 PM

## 2014-05-15 NOTE — Anesthesia Postprocedure Evaluation (Signed)
Anesthesia Post Note  Patient: Jonathan Forbes  Procedure(s) Performed: Procedure(s) (LRB): RIGHT TOTAL KNEE ARTHROPLASTY (Right)  Anesthesia type: General  Patient location: PACU  Post pain: Pain level controlled and Adequate analgesia  Post assessment: Post-op Vital signs reviewed, Patient's Cardiovascular Status Stable, Respiratory Function Stable, Patent Airway and Pain level controlled  Last Vitals:  Filed Vitals:   05/15/14 1330  BP: 143/84  Pulse: 76  Temp:   Resp: 15    Post vital signs: Reviewed and stable  Level of consciousness: awake, alert  and oriented  Complications: No apparent anesthesia complications

## 2014-05-15 NOTE — Anesthesia Preprocedure Evaluation (Signed)
Anesthesia Evaluation  Patient identified by MRN, date of birth, ID band Patient awake    Reviewed: Allergy & Precautions, H&P , NPO status , Patient's Chart, lab work & pertinent test results  Airway Mallampati: III  Neck ROM: full    Dental   Pulmonary sleep apnea ,          Cardiovascular hypertension, + Valvular Problems/Murmurs MR     Neuro/Psych Anxiety Depression  Neuromuscular disease    GI/Hepatic   Endo/Other  Morbid obesity  Renal/GU      Musculoskeletal  (+) Arthritis -,   Abdominal   Peds  Hematology   Anesthesia Other Findings   Reproductive/Obstetrics                           Anesthesia Physical Anesthesia Plan  ASA: II  Anesthesia Plan: General and Regional   Post-op Pain Management: MAC Combined w/ Regional for Post-op pain   Induction: Intravenous  Airway Management Planned: Oral ETT  Additional Equipment:   Intra-op Plan:   Post-operative Plan: Extubation in OR  Informed Consent: I have reviewed the patients History and Physical, chart, labs and discussed the procedure including the risks, benefits and alternatives for the proposed anesthesia with the patient or authorized representative who has indicated his/her understanding and acceptance.     Plan Discussed with: CRNA, Anesthesiologist and Surgeon  Anesthesia Plan Comments:         Anesthesia Quick Evaluation

## 2014-05-15 NOTE — Anesthesia Procedure Notes (Addendum)
Anesthesia Regional Block:  Femoral nerve block  Pre-Anesthetic Checklist: ,, timeout performed, Correct Patient, Correct Site, Correct Laterality, Correct Procedure, Correct Position, site marked, Risks and benefits discussed,  Surgical consent,  Pre-op evaluation,  At surgeon's request and post-op pain management  Laterality: Right  Prep: chloraprep       Needles:  Injection technique: Single-shot  Needle Type: Echogenic Stimulator Needle     Needle Length: 9cm 9 cm Needle Gauge: 21 and 21 G    Additional Needles:  Procedures: nerve stimulator Femoral nerve block  Nerve Stimulator or Paresthesia:  Response: Quadriceps muscle contraction, 0.45 mA,   Additional Responses:   Narrative:  Start time: 05/15/2014 8:02 AM End time: 05/15/2014 8:14 AM Injection made incrementally with aspirations every 5 mL.  Performed by: Personally  Anesthesiologist: Dr Marcie Bal  Additional Notes: Functioning IV was confirmed and monitors were applied.  A 83mm 21ga Arrow echogenic stimulator needle was used. Sterile prep and drape,hand hygiene and sterile gloves were used.  Negative aspiration and negative test dose prior to incremental administration of local anesthetic. The patient tolerated the procedure well.     Procedure Name: Intubation Date/Time: 05/15/2014 8:31 AM Performed by: Kyung Rudd Pre-anesthesia Checklist: Patient identified, Emergency Drugs available, Suction available, Patient being monitored and Timeout performed Patient Re-evaluated:Patient Re-evaluated prior to inductionOxygen Delivery Method: Circle system utilized Preoxygenation: Pre-oxygenation with 100% oxygen Intubation Type: IV induction and Rapid sequence Laryngoscope Size: Mac and 4 Grade View: Grade I Tube type: Oral Tube size: 7.5 mm Number of attempts: 1 Airway Equipment and Method: Stylet Placement Confirmation: ETT inserted through vocal cords under direct vision,  positive ETCO2 and breath sounds  checked- equal and bilateral Secured at: 23 cm Tube secured with: Tape Dental Injury: Injury to lip

## 2014-05-15 NOTE — Op Note (Signed)
Total Knee Arthroplasty Procedure Note TANDRE CONLY [id number] 05/15/2014   Preoperative diagnosis: Right knee osteoarthritis  Postoperative diagnosis:same  Operative procedure: Right total knee arthroplasty. CPT [id number]  Surgeon: N. Eduard Roux, MD  Co-surgeon: none  Assistants: none  Anesthesia: Spinal, regional  Tourniquet time: 120 mins  Implants used: Smith and Nephew Femur: Legion 6 PS Tibia:Genesis II 6 Patella: 32 mm 7.5 mm thick Polyethylene: 11 mm  Indication: Jonathan Forbes is a 63 y.o. year old male with a history of knee pain. Having failed conservative management, the patient elected to proceed with a total knee arthroplasty.  We have reviewed the risk and benefits of the surgery and they elected to proceed after voicing understanding.  Procedure:  After informed consent was obtained and understanding of the risk were voiced including but not limited to bleeding, infection, damage to surrounding structures including nerves and vessels, blood clots, leg length inequality and the failure to achieve desired results, the operative extremity was marked with verbal confirmation of the patient in the holding area.   The patient was then brought to the operating room and transported to the operating room table in the supine position.  A tourniquet was applied to the operative extremity around the upper thigh. The operative limb was then prepped and draped in the usual sterile fashion and preoperative antibiotics were administered.  A time out was performed prior to the start of surgery confirming the correct extremity, preoperative antibiotic administration, as well as team members, implants and instruments available for the case. Correct surgical site was also confirmed with preoperative radiographs. The limb was then elevated for exsanguination and the tourniquet was inflated. A midline incision was made and a standard medial parapatellar approach was performed.  The  patella was prepared and sized to a 32 mm.  A cover was placed on the patella for protection from retractors.  We then turned our attention to the femur. Posterior cruciate ligament was sacrificed. Start site was drilled in the femur and the intramedullary distal femoral cutting guide was placed, set at 5 degrees valgus, taking 9 mm of distal resection. The distal cut was made. Osteophytes were then removed. Next, the proximal tibial cutting guide was placed with appropriate slope, varus/valgus alignment and depth of resection. The proximal tibial cut was made. Gap blocks were then used to assess the extension gap and alignment, and appropriate soft tissue releases were performed. Attention was turned back to the femur, which was sized using the sizing guide to a size 6. Appropriate rotation of the femoral component was determined using epicondylar axis, Whiteside's line, and assessing the flexion gap under ligament tension. The appropriate size 4-in-1 cutting block was placed and cuts were made. Posterior femoral osteophytes and uncapped bone were then removed with the curved osteotome. The tibia was sized for a size 6 component. The femoral box-cutting guide was placed and prepared for a PS femoral component. Trial components were placed, and stability was checked in full extension, mid-flexion, and deep flexion. Proper tibial rotation was determined and marked.  The patella tracked well without a lateral release. Trial components were then removed and tibial preparation performed. The bony surfaces were irrigated with a pulse lavage and then dried. Bone cement was vacuum mixed on the back table, and the final components sized above were cemented into place. After cement had finished curing, excess cement was removed. The stability of the construct was re-evaluated throughout a range of motion and found to be acceptable. The trial  liner was removed, the knee was copiously irrigated, and the knee was re-evaluated  for any excess bone debris. The real polyethylene liner, 11 mm thick, was inserted and checked to ensure the locking mechanism had engaged appropriately. The tourniquet was deflated and hemostasis was achieved. The wound was irrigated with dilute betadine in normal saline, and then again with normal saline. A drain was placed. Capsular closure was performed with a #1 vicryl and #1 PDS, subcutaneous fat closed with a 0 vicryl suture, then subcutaneous tissue closed with interrupted 2.0 vicryl suture. The skin was then closed with a 2.0 nylong. A sterile dressing was applied.   The patient was awakened in the operating room and taken to recovery in stable condition. All sponge, needle, and instrument counts were correct at the end of the case.  Position: supine  Complications: none.  Time Out: performed   Drains/Packing: 1 hvac  Estimated blood loss: 50  Returned to Recovery Room: in good condition.   Antibiotics: yes   Mechanical VTE (DVT) Prophylaxis: sequential compression devices, TED thigh-high  Chemical VTE (DVT) Prophylaxis: lovenox  Fluid Replacement  Crystalloid: 1300 Blood: none  FFP: none   Specimens Removed: 1 to pathology   Sponge and Instrument Count Correct? yes   PACU: portable radiograph - knee AP and Lateral   Admission: inpatient status, start PT & OT POD#1  Plan/RTC: Return in 2 weeks for wound check.   Weight Bearing/Load Lower Extremity: full   N. Eduard Roux, MD [place] 12:12 PM

## 2014-05-15 NOTE — Progress Notes (Signed)
Patient has a foley cath and is draining amber urine. Emptied 150.

## 2014-05-15 NOTE — Progress Notes (Signed)
Utilization review completed.  

## 2014-05-16 ENCOUNTER — Encounter (HOSPITAL_COMMUNITY): Payer: Self-pay | Admitting: Orthopaedic Surgery

## 2014-05-16 DIAGNOSIS — M1710 Unilateral primary osteoarthritis, unspecified knee: Secondary | ICD-10-CM

## 2014-05-16 DIAGNOSIS — Z96659 Presence of unspecified artificial knee joint: Secondary | ICD-10-CM

## 2014-05-16 LAB — CBC
HCT: 39.8 % (ref 39.0–52.0)
Hemoglobin: 12.9 g/dL — ABNORMAL LOW (ref 13.0–17.0)
MCH: 30.6 pg (ref 26.0–34.0)
MCHC: 32.4 g/dL (ref 30.0–36.0)
MCV: 94.5 fL (ref 78.0–100.0)
PLATELETS: 200 10*3/uL (ref 150–400)
RBC: 4.21 MIL/uL — ABNORMAL LOW (ref 4.22–5.81)
RDW: 14 % (ref 11.5–15.5)
WBC: 13 10*3/uL — ABNORMAL HIGH (ref 4.0–10.5)

## 2014-05-16 LAB — BASIC METABOLIC PANEL
ANION GAP: 9 (ref 5–15)
BUN: 19 mg/dL (ref 6–23)
CALCIUM: 8.3 mg/dL — AB (ref 8.4–10.5)
CO2: 29 mEq/L (ref 19–32)
CREATININE: 1.22 mg/dL (ref 0.50–1.35)
Chloride: 104 mEq/L (ref 96–112)
GFR, EST AFRICAN AMERICAN: 72 mL/min — AB (ref >90)
GFR, EST NON AFRICAN AMERICAN: 62 mL/min — AB (ref >90)
Glucose, Bld: 144 mg/dL — ABNORMAL HIGH (ref 70–99)
Potassium: 4.7 mEq/L (ref 3.7–5.3)
SODIUM: 142 meq/L (ref 137–147)

## 2014-05-16 MED ORDER — KETOROLAC TROMETHAMINE 30 MG/ML IJ SOLN: 30.0000 mg | Freq: Four times a day (QID) | INTRAMUSCULAR | Status: DC | PRN

## 2014-05-16 NOTE — Consult Note (Signed)
Physical Medicine and Rehabilitation Consult  Reason for Consult: Endstage DJD right knee, morbid obesity. Referring Physician: Dr. Erlinda Hong.    HPI: Jonathan Forbes is a 63 y.o. 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 He was admitted on 05/15/14 for R-TKR and is WBAT RLE. Post op with ABLA as well as reactive leucocytosis. PT/OT evaluations done today and CIR recommended for follow up.    Review of Systems  Eyes: Positive for blurred vision.  Respiratory: Negative for cough and shortness of breath.   Cardiovascular: Positive for chest pain (chest wall soreness). Negative for palpitations.  Gastrointestinal: Positive for constipation. Negative for heartburn, nausea and vomiting.       Abdominal tenderness  Genitourinary: Negative for urgency and frequency.  Musculoskeletal: Positive for joint pain (right knee) and myalgias.  Neurological: Positive for weakness. Negative for dizziness, sensory change and headaches.  Psychiatric/Behavioral: Negative for depression. The patient has insomnia (acute on chronic due to knee pain last night.). The patient is not nervous/anxious.      Past Medical History  Diagnosis Date  . Hypertension   . OA (osteoarthritis) of knee     Hx: of B/L knees  . Depression   . Cancer     Hx: of skin cancer  . Glaucoma     Hx; of beginning stages  . Eczema     Hx; of  . Anxiety   . Mitral valve regurgitation     Hx; of slight  . Sleep apnea     not using CPAP at present, will have a sleep study in August  . Heart murmur    Past Surgical History  Procedure Laterality Date  . Tumor excision      skin cancer of right side of back  . Cataract extraction w/ intraocular lens  implant, bilateral      Hx; of  . Colonoscopy      Hx; of  . Tonsillectomy    . Total knee arthroplasty Left 12/05/2013    Procedure: LEFT TOTAL KNEE ARTHROPLASTY;  Surgeon: Marianna Payment, MD;  Location:  [facility];  Service: Orthopedics;  Laterality: Left;  Marland Kitchen Eye surgery    . Joint replacement Left 2015  . Breath tek h pylori N/A 05/14/2014    Procedure: BREATH TEK H PYLORI;  Surgeon: Pedro Earls, MD;  Location: [facility];  Service: General;  Laterality: N/A;  . Total knee arthroplasty Right 05/15/2014    Procedure: RIGHT TOTAL KNEE ARTHROPLASTY;  Surgeon: Marianna Payment, MD;  Location: [place];  Service: Orthopedics;  Laterality: Right;   Family History  Problem Relation Age of Onset  . Heart disease Father   . Hypertension Father    Social History:   Lives alone and independent PTA. Son in college at [employer] and is home for next two weeks. He reports that he has never smoked. He has never used smokeless tobacco. He reports that he drinks alcohol. He reports that he does not use illicit drugs.  Allergies: No Known Allergies  Medications Prior to Admission  Medication Sig Dispense Refill  . amLODipine (NORVASC) 10 MG tablet Take 10 mg by mouth every morning.       . carvedilol (COREG) 25 MG tablet Take 25 mg by mouth 2 (two) times daily with a meal.      . furosemide (LASIX) 40 MG tablet Take 40 mg by mouth every morning.       Marland Kitchen  lisinopril (PRINIVIL,ZESTRIL) 40 MG tablet Take 40 mg by mouth every morning.       . traMADol (ULTRAM) 50 MG tablet Take 50 mg by mouth 4 (four) times daily.      . Vilazodone HCl (VIIBRYD) 40 MG TABS Take 40 mg by mouth daily.        Home: Home Living Family/patient expects to be discharged to:: Inpatient rehab Living Arrangements: Alone Available Help at Discharge: Family;Friend(s);Available PRN/intermittently Type of Home: Apartment Home Access: Elevator Home Layout: One level Home Equipment: Walker - 2 wheels;Grab bars - tub/shower;Shower seat;Tub bench;Adaptive equipment Adaptive Equipment: Reacher;Sock aid;Long-handled shoe horn;Long-handled sponge Additional Comments: Pt.reports he had a VAC for a surgical wound on his back about 3 months ago,  surgical wound from skin cancer removal  Functional History: Prior Function Level of Independence: Independent Functional Status:  Mobility: Bed Mobility Overal bed mobility: Needs Assistance;+2 for physical assistance Bed Mobility: Sit to Supine Sidelying to sit: HOB elevated;Mod assist Supine to sit: Mod assist Sit to supine: Mod assist;+2 for physical assistance General bed mobility comments: Assist to support and move RLE and to bring trunk up into sitting. Transfers Overall transfer level: Needs assistance Equipment used: Rolling walker (2 wheeled) Transfers: Sit to/from Stand Sit to Stand: +2 physical assistance;Mod assist Stand pivot transfers: +2 physical assistance;Min assist General transfer comment: Assist to bring hips up. Pt didn't have knee immobilzer so only pivot with rolling walker performed.      ADL: ADL Overall ADL's : Needs assistance/impaired Eating/Feeding: Independent;Sitting Grooming: Set up;Sitting Upper Body Bathing: Set up;Sitting Lower Body Bathing: Maximal assistance;Sit to/from stand Upper Body Dressing : Set up;Sitting Lower Body Dressing: Total assistance;Sit to/from stand Toilet Transfer: +2 for physical assistance;Moderate assistance;Ambulation;RW (stand over toilet with RW) Toileting- Clothing Manipulation and Hygiene: Total assistance;Sit to/from stand  Cognition: Cognition Overall Cognitive Status: Within Functional Limits for tasks assessed Orientation Level: Oriented X4 Cognition Arousal/Alertness: Awake/alert Behavior During Therapy: WFL for tasks assessed/performed Overall Cognitive Status: Within Functional Limits for tasks assessed  Blood pressure 147/84, pulse 80, temperature 98.5 F (36.9 C), temperature source Oral, resp. rate 18, height 5\' 11"  (1.803 m), weight 154.677 kg (341 lb), SpO2 96.00%. Physical Exam  Constitutional: He is oriented to person, place, and time. He appears well-developed and well-nourished.  HENT:    Head: Normocephalic and atraumatic.  Eyes: EOM are normal. Pupils are equal, round, and reactive to light.  Neck: Normal range of motion. Neck supple.  Cardiovascular: Normal rate and regular rhythm.   No murmur heard. Respiratory: Effort normal. He has decreased breath sounds in the right lower field and the left lower field.  GI: Soft. Normal appearance. He exhibits no distension. Bowel sounds are decreased. There is generalized tenderness.  Musculoskeletal:  Right knee with compressive dressing.   Neurological: He is alert and oriented to person, place, and time.  Skin: Skin is warm and dry.  Psychiatric: He has a normal mood and affect. His behavior is normal. Thought content normal.   morbid obese, BMI 47 Motor strength: 5/5 bilateral deltoid, bicep, tricep, grip 2 minus right hip flexor, knee extensor not assessed secondary to dressing, 3 minus right ankle dorsiflexor plantar flexor 4+ left hip flexor knee extensor ankle dorsiflexor plantar flexor Sensory equal intact bilateral feet  Results for orders placed during the hospital encounter of 05/15/14 (from the past 24 hour(s))  URINALYSIS, ROUTINE W REFLEX MICROSCOPIC     Status: Abnormal   Collection Time    05/15/14  5:08 PM  Result Value Ref Range   Color, Urine YELLOW  YELLOW   APPearance CLOUDY (*) CLEAR   Specific Gravity, Urine 1.023  1.005 - 1.030   pH 5.0  5.0 - 8.0   Glucose, UA NEGATIVE  NEGATIVE mg/dL   Hgb urine dipstick NEGATIVE  NEGATIVE   Bilirubin Urine NEGATIVE  NEGATIVE   Ketones, ur NEGATIVE  NEGATIVE mg/dL   Protein, ur 30 (*) NEGATIVE mg/dL   Urobilinogen, UA 0.2  0.0 - 1.0 mg/dL   Nitrite NEGATIVE  NEGATIVE   Leukocytes, UA NEGATIVE  NEGATIVE  URINE MICROSCOPIC-ADD ON     Status: Abnormal   Collection Time    05/15/14  5:08 PM      Result Value Ref Range   Squamous Epithelial / LPF RARE  RARE   WBC, UA 0-2  <3 WBC/hpf   RBC / HPF 0-2  <3 RBC/hpf   Bacteria, UA RARE  RARE   Casts GRANULAR  CAST (*) NEGATIVE   Urine-Other AMORPHOUS URATES/PHOSPHATES    CBC     Status: Abnormal   Collection Time    05/15/14  5:10 PM      Result Value Ref Range   WBC 14.9 (*) 4.0 - 10.5 K/uL   RBC 4.61  4.22 - 5.81 MIL/uL   Hemoglobin 14.2  13.0 - 17.0 g/dL   HCT 43.1  39.0 - 52.0 %   MCV 93.5  78.0 - 100.0 fL   MCH 30.8  26.0 - 34.0 pg   MCHC 32.9  30.0 - 36.0 g/dL   RDW 13.9  11.5 - 15.5 %   Platelets 96 (*) 150 - 400 K/uL  CREATININE, SERUM     Status: Abnormal   Collection Time    05/15/14  5:10 PM      Result Value Ref Range   Creatinine, Ser 0.99  0.50 - 1.35 mg/dL   GFR calc non Af Amer 86 (*) >90 mL/min   GFR calc Af Amer >90  >90 mL/min  CBC     Status: Abnormal   Collection Time    05/16/14  5:08 AM      Result Value Ref Range   WBC 13.0 (*) 4.0 - 10.5 K/uL   RBC 4.21 (*) 4.22 - 5.81 MIL/uL   Hemoglobin 12.9 (*) 13.0 - 17.0 g/dL   HCT 39.8  39.0 - 52.0 %   MCV 94.5  78.0 - 100.0 fL   MCH 30.6  26.0 - 34.0 pg   MCHC 32.4  30.0 - 36.0 g/dL   RDW 14.0  11.5 - 15.5 %   Platelets 200  150 - 400 K/uL  BASIC METABOLIC PANEL     Status: Abnormal   Collection Time    05/16/14  5:08 AM      Result Value Ref Range   Sodium 142  137 - 147 mEq/L   Potassium 4.7  3.7 - 5.3 mEq/L   Chloride 104  96 - 112 mEq/L   CO2 29  19 - 32 mEq/L   Glucose, Bld 144 (*) 70 - 99 mg/dL   BUN 19  6 - 23 mg/dL   Creatinine, Ser 1.22  0.50 - 1.35 mg/dL   Calcium 8.3 (*) 8.4 - 10.5 mg/dL   GFR calc non Af Amer 62 (*) >90 mL/min   GFR calc Af Amer 72 (*) >90 mL/min   Anion gap 9  5 - 15   Dg Knee Right Port  05/15/2014   CLINICAL DATA:  63 year old male status post knee arthroplasty. Initial encounter.  EXAM: PORTABLE RIGHT KNEE - 1-2 VIEW  COMPARISON:  Preoperative MRI 04/07/2014.  FINDINGS: Portable AP and cross-table lateral views at 1300 hrs. Sequelae of right total knee arthroplasty. Hardware appears intact and normally aligned. Postoperative drain in place. Postoperative changes to the  surrounding soft tissues.  IMPRESSION: Right total knee arthroplasty, no adverse features identified.   Electronically Signed   By: Lars Pinks M.D.   On: 05/15/2014 13:09    Assessment/Plan: Diagnosis: End stage osteoarthritis right knee postop day #1 status post knee replacement 1. Does the need for close, 24 hr/day medical supervision in concert with the patient's rehab needs make it unreasonable for this patient to be served in a less intensive setting? Yes 2. Co-Morbidities requiring supervision/potential complications: Morbid obesity, recent left TK R. 3. Due to bowel management, safety, skin/wound care, disease management, medication administration, pain management and patient education, does the patient require 24 hr/day rehab nursing? Yes 4. Does the patient require coordinated care of a physician, rehab nurse, PT (1-2 hrs/day, 5 days/week) and OT (1-2 hrs/day, 5 days/week) to address physical and functional deficits in the context of the above medical diagnosis(es)? Yes Addressing deficits in the following areas: balance, endurance, locomotion, strength, transferring, bowel/bladder control, bathing, dressing, feeding, grooming and toileting 5. Can the patient actively participate in an intensive therapy program of at least 3 hrs of therapy per day at least 5 days per week? Yes 6. The potential for patient to make measurable gains while on inpatient rehab is good 7. Anticipated functional outcomes upon discharge from inpatient rehab are modified independent  with PT, modified independent with OT, n/a with SLP. 8. Estimated rehab length of stay to reach the above functional goals is: 7-10 days 9. Does the patient have adequate social supports to accommodate these discharge functional goals? Yes and Potentially 10. Anticipated D/C setting: Home 11. Anticipated post D/C treatments: [place] therapy 12. Overall Rehab/Functional Prognosis: excellent  RECOMMENDATIONS: This patient's condition is  appropriate for continued rehabilitative care in the following setting: CIR Patient has agreed to participate in recommended program. Yes Note that insurance prior authorization may be required for reimbursement for recommended care.  Comment: Should be ready postop day 2 or 3 if tolerating therapy    05/16/2014

## 2014-05-16 NOTE — Evaluation (Signed)
Occupational Therapy Evaluation and Discharge Patient Details Name: Jonathan Forbes MRN: [id number] DOB: 25-Oct-1951 Today's Date: 05/16/2014    History of Present Illness Pt s/p rt TKA. Pt with lt TKA in 11/2013 requiring CIR stay. Pt morbidly obese.   Clinical Impression   This 63 yo male admitted and underwent above presents to acute OT with decreased mobility, increased pain, decreased balance, decreased ROM RLE, and obesity all affecting pt's ability to care for himself and the Mod I/ Independent level he was doing pta. He will benefit from continued OT on CIR. Acute OT will sign off.    Follow Up Recommendations  CIR    Equipment Recommendations   (pt states that 3n1 they got him last time would not work for his bathroom)       Precautions / Restrictions Precautions Precautions: Fall;Knee Required Braces or Orthoses: Knee Immobilizer - Right Knee Immobilizer - Right: Discontinue once straight leg raise with < 10 degree lag;On when out of bed or walking Restrictions Weight Bearing Restrictions: No RLE Weight Bearing: Weight bearing as tolerated      Mobility Bed Mobility Overal bed mobility: Needs Assistance;+2 for physical assistance Bed Mobility: Sit to Supine   Sidelying to sit: HOB elevated;Mod assist Supine to sit: Mod assist Sit to supine: Mod assist;+2 for physical assistance   General bed mobility comments: Assist to support and move RLE and to bring trunk up into sitting.  Transfers Overall transfer level: Needs assistance Equipment used: Rolling walker (2 wheeled) Transfers: Sit to/from Stand Sit to Stand: +2 physical assistance;Mod assist          Balance Overall balance assessment: Needs assistance Sitting-balance support: No upper extremity supported Sitting balance-Leahy Scale: Good     Standing balance support: Bilateral upper extremity supported Standing balance-Leahy Scale: Poor Standing balance comment: Requires support of walker                             ADL Overall ADL's : Needs assistance/impaired Eating/Feeding: Independent;Sitting   Grooming: Set up;Sitting   Upper Body Bathing: Set up;Sitting   Lower Body Bathing: Maximal assistance;Sit to/from stand   Upper Body Dressing : Set up;Sitting   Lower Body Dressing: Total assistance;Sit to/from stand   Toilet Transfer: +2 for physical assistance;Moderate assistance;Ambulation;RW (stand over toilet with RW)   Toileting- Clothing Manipulation and Hygiene: Total assistance;Sit to/from stand                         Pertinent Vitals/Pain 6/10 RLE; repositioned and made RN aware (she came in and gave meds while we were in room with pt)     Hand Dominance Right   Extremity/Trunk Assessment Upper Extremity Assessment Upper Extremity Assessment: Overall WFL for tasks assessed          Communication Communication Communication: No difficulties   Cognition Arousal/Alertness: Awake/alert Behavior During Therapy: WFL for tasks assessed/performed Overall Cognitive Status: Within Functional Limits for tasks assessed                                Home Living Family/patient expects to be discharged to:: Inpatient rehab Living Arrangements: Alone Available Help at Discharge: Family;Friend(s);Available PRN/intermittently Type of Home: Apartment Home Access: Elevator     Home Layout: One level     Bathroom Shower/Tub: Tub/shower unit Shower/tub characteristics: Architectural technologist: Standard Bathroom Accessibility: Yes  Home Equipment: Walker - 2 wheels;Grab bars - tub/shower;Shower seat;Tub bench;Adaptive equipment Adaptive Equipment: Reacher;Sock aid;Long-handled shoe horn;Long-handled sponge Additional Comments: Pt.reports he had a VAC for a surgical wound on his back about 3 months ago, surgical wound from skin cancer removal      Prior Functioning/Environment Level of Independence: Independent              OT Diagnosis: Generalized weakness;Acute pain   OT Problem List: Decreased strength;Decreased range of motion;Decreased activity tolerance;Impaired balance (sitting and/or standing);Pain;Obesity;Decreased knowledge of use of DME or AE      OT Goals(Current goals can be found in the care plan section) Acute Rehab OT Goals Patient Stated Goal: rehab then home  OT Frequency:                End of Session Equipment Utilized During Treatment: Gait belt;Rolling walker Nurse Communication: Mobility status (and pt urinated in toliet)  Activity Tolerance: Patient tolerated treatment well Patient left: in bed;with call bell/phone within reach (declined foam footsie roll for now)   Time: 1459-1526 OT Time Calculation (min): 27 min Charges:  OT General Charges $OT Visit: 1 Procedure OT Evaluation $Initial OT Evaluation Tier I: 1 Procedure OT Treatments $Self Care/Home Management : 8-22 mins  Almon Register [phone] 05/16/2014, 4:01 PM

## 2014-05-16 NOTE — Progress Notes (Addendum)
Physical Therapy Treatment Patient Details Name: Jonathan Forbes MRN: [id number] DOB: [birth year] Today's Date: 05/16/2014    History of Present Illness Pt s/p rt TKA. Pt with lt TKA in 11/2013 requiring CIR stay. Pt morbidly obese.    PT Comments    Pt able to amb short distance. Feel he will need CIR stay prior to return home.  Follow Up Recommendations  CIR     Equipment Recommendations  3in1 (PT)    Recommendations for Other Services       Precautions / Restrictions Precautions Precautions: Fall;Knee Required Braces or Orthoses: Knee Immobilizer - Right Knee Immobilizer - Right: Discontinue once straight leg raise with < 10 degree lag;On when out of bed or walking Restrictions Weight Bearing Restrictions: No RLE Weight Bearing: Weight bearing as tolerated    Mobility   Transfers Overall transfer level: Needs assistance Equipment used: Rolling walker (2 wheeled) Transfers: Sit to/from Stand Sit to Stand: +2 physical assistance;Max assist         General transfer comment: Difficulty bringing hips up from low recliner. Recliner had 2 pillows in seat already but added 2 more blankets to raise seat height. Verbal cues for pt to use rocking momentum to get up.  Ambulation/Gait Ambulation/Gait assistance: Min assist;+2 safety/equipment Ambulation Distance (Feet): 20 Feet Assistive device: Rolling walker (2 wheeled) Gait Pattern/deviations: Step-to pattern;Decreased step length - right;Decreased stance time - right;Antalgic;Trunk flexed Gait velocity: decr Gait velocity interpretation: Below normal speed for age/gender General Gait Details: Verbal cues for technique and to stand more erect.   Stairs            Wheelchair Mobility    Modified Rankin (Stroke Patients Only)       Balance Overall balance assessment: Needs assistance Sitting-balance support: No upper extremity supported;Feet supported Sitting balance-Leahy Scale: Good     Standing  balance support: Bilateral upper extremity supported Standing balance-Leahy Scale: Poor Standing balance comment: Requires support of walker                    Cognition Arousal/Alertness: Awake/alert Behavior During Therapy: WFL for tasks assessed/performed Overall Cognitive Status: Within Functional Limits for tasks assessed                      Exercises Total Joint Exercises Goniometric ROM: Grossly 60 degrees rt knee flexion in sitting. Pt wth heavy wrapping of knee.    General Comments        Pertinent Vitals/Pain Rt knee pain. Repositioned.    Home Living Family/patient expects to be discharged to:: Inpatient rehab Living Arrangements: Alone Available Help at Discharge: Family;Friend(s);Available PRN/intermittently Type of Home: Apartment Home Access: Elevator   Home Layout: One level Home Equipment: Walker - 2 wheels;Grab bars - tub/shower;Shower seat;Tub bench;Adaptive equipment Additional Comments: Pt.reports he had a VAC for a surgical wound on his back about 3 months ago, surgical wound from skin cancer removal    Prior Function Level of Independence: Independent          PT Goals (current goals can now be found in the care plan section) Acute Rehab PT Goals Patient Stated Goal: rehab then home Progress towards PT goals: Progressing toward goals    Frequency  7X/week    PT Plan Current plan remains appropriate    Co-evaluation             End of Session Equipment Utilized During Treatment:  (Pt too large for gait belt.) Activity Tolerance: Patient tolerated  treatment well Patient left: in chair;with call bell/phone within reach     Time: 1417-1429 PT Time Calculation (min): 12 min  Charges:  $Gait Training: 8-22 mins                    G Codes:      Lauralei Clouse May 30, 2014, 4:38 PM  [facility] PT [phone]

## 2014-05-16 NOTE — Clinical Social Work Psychosocial (Signed)
Clinical Social Work Department BRIEF PSYCHOSOCIAL ASSESSMENT 05/16/2014  Patient:  Jonathan Forbes, Jonathan Forbes     Account Number:  [id number]     Admit date:  05/15/2014  Clinical Social Worker:  Delrae Sawyers  Date/Time:  05/16/2014 01:16 PM  Referred by:  Physician  Date Referred:  05/16/2014 Referred for  SNF Placement   Other Referral:   none.   Interview type:  Patient Other interview type:   none.    PSYCHOSOCIAL DATA Living Status:  ALONE Admitted from facility:   Level of care:   Primary support name:  Rusty Aus Primary support relationship to patient:  CHILD, ADULT Degree of support available:   Adequate support system. Pt states his son is in college at [employer].    CURRENT CONCERNS Current Concerns  Post-Acute Placement   Other Concerns:   none.    SOCIAL WORK ASSESSMENT / PLAN CSW met with pt at bedside to discuss discharge disposition. Pt states he will be completing therapy with CIR at time of discharge. Pt informed CSW he previously completed therapy at [facility] after his first knee repair. Pt states he would possibly be open to SNF, but will await PT recommendation.    CSW will continue to follow and assist with discharge planning needs.   Assessment/plan status:  Psychosocial Support/Ongoing Assessment of Needs Other assessment/ plan:   none.   Information/referral to community resources:   CIR versus SNF.    PATIENT'S/FAMILY'S RESPONSE TO PLAN OF CARE: Pt understanding and agreeable to CSW plan of care. Pt states he is hopeful for CIR, but is open to other rehabilitation venues. Pt expressed no further questions or concerns at this time.       Lubertha Sayres, MSW, [facility] Licensed Clinical Social Worker [phone] and [phone]

## 2014-05-16 NOTE — Evaluation (Signed)
Physical Therapy Evaluation Patient Details Name: Jonathan Forbes MRN: [id number] DOB: Oct 24, 1951 Today's Date: 05/16/2014   History of Present Illness  Pt s/p rt TKA. Pt with lt TKA in 11/2013 requiring CIR stay. Pt morbidly obese.  Clinical Impression  Pt admitted with above. Pt currently with functional limitations due to the deficits listed below (see PT Problem List).  Pt will benefit from skilled PT to increase their independence and safety with mobility to allow discharge to the venue listed below. Feel pt will need CIR as he did after last TKA. Unable to attempt amb this session due to no knee immobilizer present. Will amb later today. Required much effort and change of position for pt to be able to use urinal.      Follow Up Recommendations CIR    Equipment Recommendations  3in1 (PT)    Recommendations for Other Services       Precautions / Restrictions Precautions Precautions: Fall;Knee Required Braces or Orthoses: Knee Immobilizer - Right Knee Immobilizer - Right: Discontinue once straight leg raise with < 10 degree lag;On when out of bed or walking Restrictions RLE Weight Bearing: Weight bearing as tolerated      Mobility  Bed Mobility Overal bed mobility: Needs Assistance Bed Mobility: Supine to Sit;Sit to Supine;Sidelying to Sit   Sidelying to sit: HOB elevated;Mod assist Supine to sit: Mod assist Sit to supine: Min assist   General bed mobility comments: Assist to support and move RLE and to bring trunk up into sitting.  Transfers Overall transfer level: Needs assistance Equipment used: Rolling walker (2 wheeled) Transfers: Sit to/from [facility] Sit to Stand: +2 physical assistance;Mod assist Stand pivot transfers: +2 physical assistance;Min assist       General transfer comment: Assist to bring hips up. Pt didn't have knee immobilzer so only pivot with rolling walker performed.  Ambulation/Gait                Stairs             Wheelchair Mobility    Modified Rankin (Stroke Patients Only)       Balance Overall balance assessment: Needs assistance Sitting-balance support: No upper extremity supported Sitting balance-Leahy Scale: Good     Standing balance support: Bilateral upper extremity supported Standing balance-Leahy Scale: Poor Standing balance comment: Requires support of walker                             Pertinent Vitals/Pain 6/10 in rt knee. Repositioned and nurse bringing meds.    Home Living Family/patient expects to be discharged to:: Inpatient rehab Living Arrangements: Alone Available Help at Discharge: Family;Friend(s);Available PRN/intermittently Type of Home: Apartment Home Access: Elevator     Home Layout: One level Home Equipment: Walker - 2 wheels      Prior Function Level of Independence: Independent               Hand Dominance   Dominant Hand: Right    Extremity/Trunk Assessment   Upper Extremity Assessment: Defer to OT evaluation           Lower Extremity Assessment: RLE deficits/detail RLE Deficits / Details: less than 3/5 due to pain       Communication   Communication: No difficulties  Cognition Arousal/Alertness: Awake/alert Behavior During Therapy: WFL for tasks assessed/performed Overall Cognitive Status: Within Functional Limits for tasks assessed  General Comments      Exercises Total Joint Exercises Quad Sets: AAROM;Right;10 reps;Supine Heel Slides: AAROM;Right;10 reps;Supine      Assessment/Plan    PT Assessment Patient needs continued PT services  PT Diagnosis Difficulty walking;Acute pain   PT Problem List Decreased strength;Decreased range of motion;Decreased activity tolerance;Decreased balance;Decreased mobility;Obesity  PT Treatment Interventions DME instruction;Gait training;Functional mobility training;Therapeutic activities;Stair training;Therapeutic  exercise;Patient/family education;Balance training   PT Goals (Current goals can be found in the Care Plan section) Acute Rehab PT Goals Patient Stated Goal: Return home PT Goal Formulation: With patient Time For Goal Achievement: 05/23/14 Potential to Achieve Goals: Good    Frequency 7X/week   Barriers to discharge Decreased caregiver support      Co-evaluation               End of Session   Activity Tolerance: Patient tolerated treatment well Patient left: in chair;with call bell/phone within reach Nurse Communication: Mobility status         Time: 9357-0177 PT Time Calculation (min): 61 min   Charges:   PT Evaluation $Initial PT Evaluation Tier I: 1 Procedure PT Treatments $Gait Training: 23-37 mins $Therapeutic Exercise: 8-22 mins   PT G Codes:          Carder Yin 2014/06/08, 12:27 PM  [facility] PT [phone]

## 2014-05-16 NOTE — Progress Notes (Signed)
   Subjective:  Patient reports pain as severe overnight.    Objective:   VITALS:   Filed Vitals:   05/15/14 1550 05/15/14 1944 05/16/14 0016 05/16/14 0413  BP: 148/84 121/63 149/76 147/84  Pulse: 74 74 85 80  Temp: 98 F (36.7 C) 99 F (37.2 C) 98.7 F (37.1 C) 98.5 F (36.9 C)  TempSrc: Oral Oral Oral Oral  Resp: 14 18 16 18   Height:      Weight:      SpO2: 94% 92% 94% 96%    Neurologically intact Neurovascular intact Sensation intact distally Intact pulses distally Dorsiflexion/Plantar flexion intact Incision: dressing C/D/I and no drainage No cellulitis present Compartment soft   Lab Results  Component Value Date   WBC 13.0* 05/16/2014   HGB 12.9* 05/16/2014   HCT 39.8 05/16/2014   MCV 94.5 05/16/2014   PLT 200 05/16/2014     Assessment/Plan:  1 Day Post-Op   - Expected postop acute blood loss anemia - will monitor for symptoms - Up with PT/OT - DVT ppx - SCDs, ambulation, lovenox - WBAT right lower extremity - HVAC removed - Pain control - toradol added - UA neg for UTI - KI until good quad function - Discharge planning  Marianna Payment 05/16/2014, 8:00 AM [phone]

## 2014-05-16 NOTE — Progress Notes (Signed)
Rehab Admissions Coordinator Note:  Patient was screened by Retta Diones for appropriateness for an Inpatient Acute Rehab Consult.  At this time, we are recommending Inpatient Rehab consult.  Retta Diones 05/16/2014, 12:43 PM  I can be reached at [phone].

## 2014-05-17 ENCOUNTER — Inpatient Hospital Stay (HOSPITAL_COMMUNITY)
Admission: RE | Admit: 2014-05-17 | Discharge: 2014-05-24 | DRG: 945 | Disposition: A | Discharge status: Home Health | Payer: Medicare Other | Source: Intra-hospital | Attending: Physical Medicine & Rehabilitation | Admitting: Physical Medicine & Rehabilitation

## 2014-05-17 DIAGNOSIS — D62 Acute posthemorrhagic anemia: Secondary | ICD-10-CM | POA: Diagnosis present

## 2014-05-17 DIAGNOSIS — K5900 Constipation, unspecified: Secondary | ICD-10-CM | POA: Diagnosis not present

## 2014-05-17 DIAGNOSIS — I059 Rheumatic mitral valve disease, unspecified: Secondary | ICD-10-CM | POA: Diagnosis present

## 2014-05-17 DIAGNOSIS — D72829 Elevated white blood cell count, unspecified: Secondary | ICD-10-CM | POA: Diagnosis present

## 2014-05-17 DIAGNOSIS — Z5189 Encounter for other specified aftercare: Principal | ICD-10-CM

## 2014-05-17 DIAGNOSIS — Z85828 Personal history of other malignant neoplasm of skin: Secondary | ICD-10-CM

## 2014-05-17 DIAGNOSIS — G4733 Obstructive sleep apnea (adult) (pediatric): Secondary | ICD-10-CM | POA: Diagnosis present

## 2014-05-17 DIAGNOSIS — M1710 Unilateral primary osteoarthritis, unspecified knee: Secondary | ICD-10-CM | POA: Diagnosis not present

## 2014-05-17 DIAGNOSIS — F341 Dysthymic disorder: Secondary | ICD-10-CM | POA: Diagnosis present

## 2014-05-17 DIAGNOSIS — I10 Essential (primary) hypertension: Secondary | ICD-10-CM | POA: Diagnosis present

## 2014-05-17 DIAGNOSIS — IMO0002 Reserved for concepts with insufficient information to code with codable children: Secondary | ICD-10-CM

## 2014-05-17 DIAGNOSIS — Z96659 Presence of unspecified artificial knee joint: Secondary | ICD-10-CM

## 2014-05-17 DIAGNOSIS — M1711 Unilateral primary osteoarthritis, right knee: Secondary | ICD-10-CM | POA: Diagnosis present

## 2014-05-17 DIAGNOSIS — G5710 Meralgia paresthetica, unspecified lower limb: Secondary | ICD-10-CM

## 2014-05-17 LAB — CBC
HCT: 35.6 % — ABNORMAL LOW (ref 39.0–52.0)
HCT: 36.5 % — ABNORMAL LOW (ref 39.0–52.0)
Hemoglobin: 11.3 g/dL — ABNORMAL LOW (ref 13.0–17.0)
Hemoglobin: 11.8 g/dL — ABNORMAL LOW (ref 13.0–17.0)
MCH: 29.8 pg (ref 26.0–34.0)
MCH: 30.6 pg (ref 26.0–34.0)
MCHC: 31.7 g/dL (ref 30.0–36.0)
MCHC: 32.3 g/dL (ref 30.0–36.0)
MCV: 93.9 fL (ref 78.0–100.0)
MCV: 94.8 fL (ref 78.0–100.0)
PLATELETS: 194 10*3/uL (ref 150–400)
Platelets: 180 10*3/uL (ref 150–400)
RBC: 3.79 MIL/uL — ABNORMAL LOW (ref 4.22–5.81)
RBC: 3.85 MIL/uL — ABNORMAL LOW (ref 4.22–5.81)
RDW: 14.2 % (ref 11.5–15.5)
RDW: 13.9 % (ref 11.5–15.5)
WBC: 10.6 10*3/uL — AB (ref 4.0–10.5)
WBC: 11.3 10*3/uL — ABNORMAL HIGH (ref 4.0–10.5)

## 2014-05-17 LAB — CREATININE, SERUM
CREATININE: 1.24 mg/dL (ref 0.50–1.35)
GFR calc Af Amer: 70 mL/min — ABNORMAL LOW (ref >90)
GFR calc non Af Amer: 61 mL/min — ABNORMAL LOW (ref >90)

## 2014-05-17 MED ORDER — ALUM & MAG HYDROXIDE-SIMETH 200-200-20 MG/5ML PO SUSP: 30.0000 mL | ORAL | Status: DC | PRN

## 2014-05-17 MED ORDER — PHENOL 1.4 % MT LIQD
1.0000 | OROMUCOSAL | Status: DC | PRN
  Filled 2014-05-17: qty 177

## 2014-05-17 MED ORDER — ACETAMINOPHEN 325 MG PO TABS: 325.0000 mg | ORAL_TABLET | ORAL | Status: DC | PRN

## 2014-05-17 MED ORDER — BISACODYL 10 MG RE SUPP
10.0000 mg | Freq: Every day | RECTAL | Status: DC | PRN
Start: 2014-05-17 — End: 2014-05-24

## 2014-05-17 MED ORDER — VILAZODONE HCL 40 MG PO TABS
40.0000 mg | ORAL_TABLET | Freq: Every day | ORAL | Status: DC
  Administered 2014-05-18 – 2014-05-24 (×7): 40 mg via ORAL
  Filled 2014-05-17 (×9): qty 1

## 2014-05-17 MED ORDER — FUROSEMIDE 40 MG PO TABS
40.0000 mg | ORAL_TABLET | Freq: Every morning | ORAL | Status: DC
  Administered 2014-05-18 – 2014-05-24 (×7): 40 mg via ORAL
  Filled 2014-05-17 (×7): qty 1

## 2014-05-17 MED ORDER — PROCHLORPERAZINE EDISYLATE 5 MG/ML IJ SOLN
5.0000 mg | Freq: Four times a day (QID) | INTRAMUSCULAR | Status: DC | PRN
  Filled 2014-05-17: qty 2

## 2014-05-17 MED ORDER — CELECOXIB 200 MG PO CAPS
200.0000 mg | ORAL_CAPSULE | Freq: Every day | ORAL | Status: DC
  Administered 2014-05-18 – 2014-05-24 (×7): 200 mg via ORAL
  Filled 2014-05-17 (×8): qty 1

## 2014-05-17 MED ORDER — GUAIFENESIN-DM 100-10 MG/5ML PO SYRP: 5.0000 mL | ORAL_SOLUTION | Freq: Four times a day (QID) | ORAL | Status: DC | PRN

## 2014-05-17 MED ORDER — PROCHLORPERAZINE MALEATE 5 MG PO TABS
5.0000 mg | ORAL_TABLET | Freq: Four times a day (QID) | ORAL | Status: DC | PRN
  Filled 2014-05-17: qty 2

## 2014-05-17 MED ORDER — DIPHENHYDRAMINE HCL 12.5 MG/5ML PO ELIX: 25.0000 mg | ORAL_SOLUTION | ORAL | Status: DC | PRN

## 2014-05-17 MED ORDER — ONDANSETRON HCL 4 MG PO TABS
4.0000 mg | ORAL_TABLET | Freq: Four times a day (QID) | ORAL | Status: DC | PRN
  Administered 2014-05-19: 4 mg via ORAL
  Filled 2014-05-17: qty 1

## 2014-05-17 MED ORDER — OXYCODONE HCL ER 20 MG PO T12A
20.0000 mg | EXTENDED_RELEASE_TABLET | Freq: Two times a day (BID) | ORAL | Status: AC
  Administered 2014-05-17 – 2014-05-22 (×10): 20 mg via ORAL
  Filled 2014-05-17 (×6): qty 1
  Filled 2014-05-17: qty 2
  Filled 2014-05-17 (×5): qty 1

## 2014-05-17 MED ORDER — TRAMADOL HCL 50 MG PO TABS
50.0000 mg | ORAL_TABLET | Freq: Four times a day (QID) | ORAL | Status: DC | PRN
  Administered 2014-05-19 – 2014-05-24 (×3): 50 mg via ORAL
  Filled 2014-05-17 (×5): qty 1

## 2014-05-17 MED ORDER — METHOCARBAMOL 500 MG PO TABS
500.0000 mg | ORAL_TABLET | Freq: Four times a day (QID) | ORAL | Status: DC | PRN
Start: 2014-05-17 — End: 2014-05-24
  Administered 2014-05-18 – 2014-05-24 (×5): 500 mg via ORAL
  Filled 2014-05-17 (×7): qty 1

## 2014-05-17 MED ORDER — MENTHOL 3 MG MT LOZG
1.0000 | LOZENGE | OROMUCOSAL | Status: DC | PRN
  Filled 2014-05-17: qty 9

## 2014-05-17 MED ORDER — OXYCODONE HCL 5 MG PO TABS
5.0000 mg | ORAL_TABLET | ORAL | Status: DC | PRN
  Administered 2014-05-18 (×2): 5 mg via ORAL
  Administered 2014-05-18 – 2014-05-21 (×7): 10 mg via ORAL
  Administered 2014-05-21: 5 mg via ORAL
  Administered 2014-05-21 – 2014-05-24 (×14): 10 mg via ORAL
  Filled 2014-05-17 (×8): qty 2
  Filled 2014-05-17: qty 1
  Filled 2014-05-17 (×15): qty 2

## 2014-05-17 MED ORDER — CARVEDILOL 25 MG PO TABS
25.0000 mg | ORAL_TABLET | Freq: Two times a day (BID) | ORAL | Status: DC
  Administered 2014-05-18 – 2014-05-24 (×13): 25 mg via ORAL
  Filled 2014-05-17 (×15): qty 1

## 2014-05-17 MED ORDER — ENOXAPARIN SODIUM 30 MG/0.3ML ~~LOC~~ SOLN
30.0000 mg | Freq: Two times a day (BID) | SUBCUTANEOUS | Status: DC
  Administered 2014-05-18 – 2014-05-24 (×13): 30 mg via SUBCUTANEOUS
  Filled 2014-05-17 (×16): qty 0.3

## 2014-05-17 MED ORDER — AMLODIPINE BESYLATE 10 MG PO TABS
10.0000 mg | ORAL_TABLET | Freq: Every morning | ORAL | Status: DC
  Administered 2014-05-18 – 2014-05-24 (×7): 10 mg via ORAL
  Filled 2014-05-17 (×7): qty 1

## 2014-05-17 MED ORDER — TRAZODONE HCL 50 MG PO TABS
25.0000 mg | ORAL_TABLET | Freq: Every evening | ORAL | Status: DC | PRN
  Administered 2014-05-17 – 2014-05-22 (×5): 50 mg via ORAL
  Filled 2014-05-17 (×5): qty 1

## 2014-05-17 MED ORDER — PROCHLORPERAZINE 25 MG RE SUPP
12.5000 mg | Freq: Four times a day (QID) | RECTAL | Status: DC | PRN
  Filled 2014-05-17: qty 1

## 2014-05-17 MED ORDER — LISINOPRIL 40 MG PO TABS
40.0000 mg | ORAL_TABLET | Freq: Every morning | ORAL | Status: DC
Start: 2014-05-18 — End: 2014-05-24
  Administered 2014-05-18 – 2014-05-24 (×7): 40 mg via ORAL
  Filled 2014-05-17 (×7): qty 1

## 2014-05-17 MED ORDER — ONDANSETRON HCL 4 MG/2ML IJ SOLN: 4.0000 mg | Freq: Four times a day (QID) | INTRAMUSCULAR | Status: DC | PRN

## 2014-05-17 MED ORDER — POLYETHYLENE GLYCOL 3350 17 G PO PACK
17.0000 g | PACK | Freq: Two times a day (BID) | ORAL | Status: DC
  Administered 2014-05-17 – 2014-05-23 (×10): 17 g via ORAL
  Filled 2014-05-17 (×16): qty 1

## 2014-05-17 MED ORDER — FLEET ENEMA 7-19 GM/118ML RE ENEM: 1.0000 | ENEMA | Freq: Once | RECTAL | Status: AC | PRN

## 2014-05-17 NOTE — Progress Notes (Signed)
Rehab admissions - Evaluated for possible admission.  I met with patient and he is agreeable to inpatient rehab admission.  I spoke with Dr. Erlinda Hong and he feels patient ready for admit today.  Beds available and will admit to inpatient rehab today.  Call me for questions.  #[phone]

## 2014-05-17 NOTE — Discharge Instructions (Signed)
1. Change dressing as needed.   2. Need to apply betadine paints to incision once a day starting 05/25/2014. 3. Take lovenox to prevent blood clots 4. Take stool softeners to prevent constipation

## 2014-05-17 NOTE — Interval H&P Note (Signed)
KEYAN FOLSON was admitted today to Inpatient Rehabilitation with the diagnosis of right knee OA s/p TKA.  The patient's history has been reviewed, patient examined, and there is no change in status.  Patient continues to be appropriate for intensive inpatient rehabilitation.  I have reviewed the patient's chart and labs.  Questions were answered to the patient's satisfaction.  Timofey Carandang T 05/17/2014, 8:28 PM

## 2014-05-17 NOTE — Discharge Summary (Signed)
Physician Discharge Summary      Patient ID: Jonathan Forbes MRN: [id number] DOB/AGE: [birth year] 62 y.o.  Admit date: 05/15/2014 Discharge date: 05/17/2014  Admission Diagnoses:  Osteoarthritis of right knee  Discharge Diagnoses:  Principal Problem:   Osteoarthritis of right knee Active Problems:   Osteoarthritis of left knee   Past Medical History  Diagnosis Date  . Hypertension   . Depression   . Glaucoma     Hx; of beginning stages  . Eczema     Hx; of  . Anxiety   . Mitral valve regurgitation     Hx; of slight  . Heart murmur   . OSA (obstructive sleep apnea)     not using CPAP at present, will have a sleep study in August  . OA (osteoarthritis) of knee     "both" (05/16/2014)  . Skin cancer 08/2013    "back"    Surgeries: Procedure(s): RIGHT TOTAL KNEE ARTHROPLASTY on 05/15/2014   Consultants (if any):    Discharged Condition: Improved  Hospital Course: Jonathan Forbes is an 63 y.o. male who was admitted 05/15/2014 with a diagnosis of Osteoarthritis of right knee and went to the operating room on 05/15/2014 and underwent the above named procedures.    He was given perioperative antibiotics:      Anti-infectives   Start     Dose/Rate Route Frequency Ordered Stop   05/15/14 2000  ciprofloxacin (CIPRO) tablet 500 mg  Status:  Discontinued     500 mg Oral 2 times daily 05/15/14 1602 05/15/14 2149   05/15/14 1700  ceFAZolin (ANCEF) IVPB 2 g/50 mL premix     2 g 100 mL/hr over 30 Minutes Intravenous Every 6 hours 05/15/14 1602 05/16/14 0002   05/15/14 0817  ceFAZolin (ANCEF) 2-3 GM-% IVPB SOLR    Comments:  Elige Ko   : cabinet override      05/15/14 0817 05/15/14 0835   05/15/14 0817  ceFAZolin (ANCEF) 1-5 GM-% IVPB    Comments:  Elige Ko   : cabinet override      05/15/14 0817 05/15/14 2029    .  He was given sequential compression devices, early ambulation, and lovenox for DVT prophylaxis.  He benefited maximally from the hospital  stay and there were no complications.  He was evaluated by physical therapy and determined to be appropriate for inpatient rehab.  He was transferred to that unit on postop day 2 in stable condition.    Recent vital signs:  Filed Vitals:   05/17/14 1337  BP: 100/61  Pulse: 70  Temp: 97.1 F (36.2 C)  Resp: 16    Recent laboratory studies:  Lab Results  Component Value Date   HGB 11.3* 05/17/2014   HGB 12.9* 05/16/2014   HGB 14.2 05/15/2014   Lab Results  Component Value Date   WBC 10.6* 05/17/2014   PLT 180 05/17/2014   Lab Results  Component Value Date   INR 1.07 05/09/2014   Lab Results  Component Value Date   NA 142 05/16/2014   K 4.7 05/16/2014   CL 104 05/16/2014   CO2 29 05/16/2014   BUN 19 05/16/2014   CREATININE 1.22 05/16/2014   GLUCOSE 144* 05/16/2014    Discharge Medications:     Medication List         amLODipine 10 MG tablet  Commonly known as:  NORVASC  Take 10 mg by mouth every morning.     carvedilol 25 MG tablet  Commonly known  as:  COREG  Take 25 mg by mouth 2 (two) times daily with a meal.     enoxaparin 40 MG/0.4ML injection  Commonly known as:  LOVENOX  Inject 0.4 mLs (40 mg total) into the skin daily.     furosemide 40 MG tablet  Commonly known as:  LASIX  Take 40 mg by mouth every morning.     lisinopril 40 MG tablet  Commonly known as:  PRINIVIL,ZESTRIL  Take 40 mg by mouth every morning.     oxyCODONE 5 MG immediate release tablet  Commonly known as:  Oxy IR/ROXICODONE  Take 1-3 tablets (5-15 mg total) by mouth every 4 (four) hours as needed.     senna-docusate 8.6-50 MG per tablet  Commonly known as:  SENOKOT S  Take 1 tablet by mouth at bedtime as needed.     traMADol 50 MG tablet  Commonly known as:  ULTRAM  Take 50 mg by mouth 4 (four) times daily.     VIIBRYD 40 MG Tabs  Generic drug:  Vilazodone HCl  Take 40 mg by mouth daily.        Diagnostic Studies: Dg Knee Right Port  05/15/2014   CLINICAL DATA:  63 year old  male status post knee arthroplasty. Initial encounter.  EXAM: PORTABLE RIGHT KNEE - 1-2 VIEW  COMPARISON:  Preoperative MRI 04/07/2014.  FINDINGS: Portable AP and cross-table lateral views at 1300 hrs. Sequelae of right total knee arthroplasty. Hardware appears intact and normally aligned. Postoperative drain in place. Postoperative changes to the surrounding soft tissues.  IMPRESSION: Right total knee arthroplasty, no adverse features identified.   Electronically Signed   By: Lars Pinks M.D.   On: 05/15/2014 13:09   Dg Duanne Limerick W/kub  05/14/2014   CLINICAL DATA:  Preoperative evaluation for bariatric surgery.  EXAM: UPPER GI SERIES WITH KUB  TECHNIQUE: After obtaining a scout radiograph a routine upper GI series was performed using thin barium  FLUOROSCOPY TIME:  2 min, 54 seconds  COMPARISON:  None.  FINDINGS: Scout film of the abdomen demonstrates normal bowel gas pattern. No organomegaly. Small calcification projects over the lower pole of the left kidney, possibly left renal stone.  Fluoroscopic evaluation of swallowing demonstrates normal cervical esophagus. Normal esophageal motility. There is a small hiatal hernia present. No reflux with the water siphon maneuver. No fixed stricture, fold thickening or mass. Stomach, duodenal bulb and duodenal sweep are unremarkable. No ulceration, mass or fold thickening.  Incidentally noted is soft tissue prominence in the superior mediastinum. This may reflect prominent vasculature or substernal thyroid.  IMPRESSION: Small hiatal hernia.  Possible small left lower pole renal stone.  Prominent mediastinal shadow. Recommend further evaluation with two view chest x-ray.   Electronically Signed   By: Rolm Baptise M.D.   On: 05/14/2014 09:27    Disposition: 01-Home or Self Care  Discharge Instructions   Call MD / Call 911    Complete by:  As directed   If you experience chest pain or shortness of breath, CALL 911 and be transported to the hospital emergency room.  If you  develope a fever above 101.5 F, pus (white drainage) or increased drainage or redness at the wound, or calf pain, call your surgeon's office.     Constipation Prevention    Complete by:  As directed   Drink plenty of fluids.  Prune juice may be helpful.  You may use a stool softener, such as Colace (over the counter) 100 mg twice a day.  Use MiraLax (over the counter) for constipation as needed.     Diet - low sodium heart healthy    Complete by:  As directed      Diet general    Complete by:  As directed      Do not put a pillow under the knee. Place it under the heel.    Complete by:  As directed      Driving restrictions    Complete by:  As directed   No driving while taking narcotic pain meds.     Increase activity slowly as tolerated    Complete by:  As directed      TED hose    Complete by:  As directed   Use stockings (TED hose) for 6 weeks on both leg(s).  You may remove them at night for sleeping.     Weight bearing as tolerated    Complete by:  As directed            Follow-up Information   Follow up with Marianna Payment, MD In 2 weeks. (For suture removal, For wound re-check)    Specialty:  Orthopedic Surgery   Contact information:   [address] [place] [address] [phone]        Signed: Marianna Payment 05/17/2014, 3:36 PM

## 2014-05-17 NOTE — Progress Notes (Signed)
   Subjective:  Patient reports pain as much better.  Objective:   VITALS:   Filed Vitals:   05/16/14 2000 05/16/14 2158 05/17/14 0000 05/17/14 0656  BP:  132/61  113/64  Pulse:  83  75  Temp:  98.2 F (36.8 C)  97.4 F (36.3 C)  TempSrc:  Oral  Oral  Resp: 17 16 17 16   Height:      Weight:      SpO2:  93%  93%    Neurologically intact Neurovascular intact Sensation intact distally Intact pulses distally Dorsiflexion/Plantar flexion intact Incision: dressing C/D/I and no drainage No cellulitis present Compartment soft   Lab Results  Component Value Date   WBC 10.6* 05/17/2014   HGB 11.3* 05/17/2014   HCT 35.6* 05/17/2014   MCV 93.9 05/17/2014   PLT 180 05/17/2014     Assessment/Plan:  2 Days Post-Op   - Expected postop acute blood loss anemia - will monitor for symptoms - Up with PT/OT - recommending CIR - DVT ppx - SCDs, ambulation, lovenox - WBAT right lower extremity - Pain control - toradol added - KI until good quad function - dressings changed  - Discharge planning  Marianna Payment 05/17/2014, 8:17 AM [phone]

## 2014-05-17 NOTE — Progress Notes (Signed)
Seen and agreed 05/17/2014 Jacqualyn Posey PTA [phone] pager [phone] office

## 2014-05-17 NOTE — Care Management Note (Signed)
CARE MANAGEMENT NOTE 05/17/2014  Patient:  Jonathan Forbes, Jonathan Forbes   Account Number:  [id number]  Date Initiated:  05/17/2014  Documentation initiated by:  Ricki Miller  Subjective/Objective Assessment:   63 yr old male s/p right total knee arthroplasty.     Action/Plan:   Patient is for inpatient rehab. Has been seen and accepted.   Anticipated DC Date:  05/17/2014   Anticipated DC Plan:  IP REHAB FACILITY  In-house referral  NA      DC Planning Services  CM consult      PAC Choice  IP REHAB   Choice offered to / List presented to:     DME arranged  NA        [place] arranged  NA      Status of service:  Completed, signed off Medicare Important Message given?  YES (If response is "NO", the following Medicare IM given date fields will be blank) Date Medicare IM given:  05/09/2014 Medicare IM given by:   Date Additional Medicare IM given:  05/17/2014 Additional Medicare IM given by:  Ricki Miller  Discharge Disposition:  IP REHAB FACILITY  Per UR Regulation:  Reviewed for med. necessity/level of care/duration of stay  If discussed at [place] of Stay Meetings, dates discussed:

## 2014-05-17 NOTE — Progress Notes (Signed)
Report called to [facility] RN on the rehab department. Pt is being transferred to [id number] at this time.

## 2014-05-17 NOTE — Progress Notes (Signed)
Physical Medicine and Rehabilitation Consult  Reason for Consult: Endstage DJD right knee, morbid obesity.  Referring Physician: Dr. Erlinda Hong.  HPI: Jonathan Forbes is a 63 y.o. 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 He was admitted on 05/15/14 for R-TKR and is WBAT RLE. Post op with ABLA as well as reactive leucocytosis. PT/OT evaluations done today and CIR recommended for follow up.  Review of Systems  Eyes: Positive for blurred vision.  Respiratory: Negative for cough and shortness of breath.  Cardiovascular: Positive for chest pain (chest wall soreness). Negative for palpitations.  Gastrointestinal: Positive for constipation. Negative for heartburn, nausea and vomiting.  Abdominal tenderness  Genitourinary: Negative for urgency and frequency.  Musculoskeletal: Positive for joint pain (right knee) and myalgias.  Neurological: Positive for weakness. Negative for dizziness, sensory change and headaches.  Psychiatric/Behavioral: Negative for depression. The patient has insomnia (acute on chronic due to knee pain last night.). The patient is not nervous/anxious.   Past Medical History   Diagnosis  Date   .  Hypertension    .  OA (osteoarthritis) of knee      Hx: of B/L knees   .  Depression    .  Cancer      Hx: of skin cancer   .  Glaucoma      Hx; of beginning stages   .  Eczema      Hx; of   .  Anxiety    .  Mitral valve regurgitation      Hx; of slight   .  Sleep apnea      not using CPAP at present, will have a sleep study in August   .  Heart murmur     Past Surgical History   Procedure  Laterality  Date   .  Tumor excision       skin cancer of right side of back   .  Cataract extraction w/ intraocular lens implant, bilateral       Hx; of   .  Colonoscopy       Hx; of   .  Tonsillectomy     .  Total knee arthroplasty  Left  12/05/2013     Procedure: LEFT TOTAL KNEE ARTHROPLASTY; Surgeon: Marianna Payment, MD; Location: [facility]; Service: Orthopedics; Laterality: Left;   Marland Kitchen  Eye surgery     .  Joint replacement  Left  2015   .  Breath tek h pylori  N/A  05/14/2014     Procedure: BREATH TEK H PYLORI; Surgeon: Pedro Earls, MD; Location: [facility]; Service: General; Laterality: N/A;   .  Total knee arthroplasty  Right  05/15/2014     Procedure: RIGHT TOTAL KNEE ARTHROPLASTY; Surgeon: Marianna Payment, MD; Location: [facility]; Service: Orthopedics; Laterality: Right;    Family History   Problem  Relation  Age of Onset   .  Heart disease  Father    .  Hypertension  Father     Social History: Lives alone and independent PTA. Son in college at [employer] and is home for next two weeks. He reports that he has never smoked. He has never used smokeless tobacco. He reports that he drinks alcohol. He reports that he does not use illicit drugs.  Allergies: No Known Allergies  Medications Prior to Admission   Medication  Sig  Dispense  Refill   .  amLODipine (NORVASC) 10 MG tablet  Take 10 mg by mouth every morning.     .  carvedilol (COREG) 25 MG tablet  Take 25 mg by mouth 2 (two) times daily with a meal.     .  furosemide (LASIX) 40 MG tablet  Take 40 mg by mouth every morning.     Marland Kitchen  lisinopril (PRINIVIL,ZESTRIL) 40 MG tablet  Take 40 mg by mouth every morning.     .  traMADol (ULTRAM) 50 MG tablet  Take 50 mg by mouth 4 (four) times daily.     .  Vilazodone HCl (VIIBRYD) 40 MG TABS  Take 40 mg by mouth daily.      Home:  Home Living  Family/patient expects to be discharged to:: Inpatient rehab  Living Arrangements: Alone  Available Help at Discharge: Family;Friend(s);Available PRN/intermittently  Type of Home: Apartment  Home Access: Elevator  Home Layout: One level  Home Equipment: Walker - 2 wheels;Grab bars - tub/shower;Shower seat;Tub bench;Adaptive equipment  Adaptive Equipment: Reacher;Sock aid;Long-handled shoe horn;Long-handled sponge  Additional Comments: Pt.reports he had a VAC for a  surgical wound on his back about 3 months ago, surgical wound from skin cancer removal  Functional History:  Prior Function  Level of Independence: Independent  Functional Status:  Mobility:  Bed Mobility  Overal bed mobility: Needs Assistance;+2 for physical assistance  Bed Mobility: Sit to Supine  Sidelying to sit: HOB elevated;Mod assist  Supine to sit: Mod assist  Sit to supine: Mod assist;+2 for physical assistance  General bed mobility comments: Assist to support and move RLE and to bring trunk up into sitting.  Transfers  Overall transfer level: Needs assistance  Equipment used: Rolling walker (2 wheeled)  Transfers: Sit to/from Stand  Sit to Stand: +2 physical assistance;Mod assist  Stand pivot transfers: +2 physical assistance;Min assist  General transfer comment: Assist to bring hips up. Pt didn't have knee immobilzer so only pivot with rolling walker performed.    ADL:  ADL  Overall ADL's : Needs assistance/impaired  Eating/Feeding: Independent;Sitting  Grooming: Set up;Sitting  Upper Body Bathing: Set up;Sitting  Lower Body Bathing: Maximal assistance;Sit to/from stand  Upper Body Dressing : Set up;Sitting  Lower Body Dressing: Total assistance;Sit to/from stand  Toilet Transfer: +2 for physical assistance;Moderate assistance;Ambulation;RW (stand over toilet with RW)  Toileting- Clothing Manipulation and Hygiene: Total assistance;Sit to/from stand  Cognition:  Cognition  Overall Cognitive Status: Within Functional Limits for tasks assessed  Orientation Level: Oriented X4  Cognition  Arousal/Alertness: Awake/alert  Behavior During Therapy: WFL for tasks assessed/performed  Overall Cognitive Status: Within Functional Limits for tasks assessed  Blood pressure 147/84, pulse 80, temperature 98.5 F (36.9 C), temperature source Oral, resp. rate 18, height 5\' 11"  (1.803 m), weight 154.677 kg (341 lb), SpO2 96.00%.  Physical Exam  Constitutional: He is oriented to  person, place, and time. He appears well-developed and well-nourished.  HENT:  Head: Normocephalic and atraumatic.  Eyes: EOM are normal. Pupils are equal, round, and reactive to light.  Neck: Normal range of motion. Neck supple.  Cardiovascular: Normal rate and regular rhythm.  No murmur heard.  Respiratory: Effort normal. He has decreased breath sounds in the right lower field and the left lower field.  GI: Soft. Normal appearance. He exhibits no distension. Bowel sounds are decreased. There is generalized tenderness.  Musculoskeletal:  Right knee with compressive dressing.  Neurological: He is alert and oriented to person, place, and time.  Skin: Skin is warm and dry.  Psychiatric: He has a normal mood  and affect. His behavior is normal. Thought content normal.  morbid obese, BMI 47  Motor strength: 5/5 bilateral deltoid, bicep, tricep, grip  2 minus right hip flexor, knee extensor not assessed secondary to dressing, 3 minus right ankle dorsiflexor plantar flexor  4+ left hip flexor knee extensor ankle dorsiflexor plantar flexor  Sensory equal intact bilateral feet  Results for orders placed during the hospital encounter of 05/15/14 (from the past 24 hour(s))   URINALYSIS, ROUTINE W REFLEX MICROSCOPIC Status: Abnormal    Collection Time    05/15/14 5:08 PM   Result  Value  Ref Range    Color, Urine  YELLOW  YELLOW    APPearance  CLOUDY (*)  CLEAR    Specific Gravity, Urine  1.023  1.005 - 1.030    pH  5.0  5.0 - 8.0    Glucose, UA  NEGATIVE  NEGATIVE mg/dL    Hgb urine dipstick  NEGATIVE  NEGATIVE    Bilirubin Urine  NEGATIVE  NEGATIVE    Ketones, ur  NEGATIVE  NEGATIVE mg/dL    Protein, ur  30 (*)  NEGATIVE mg/dL    Urobilinogen, UA  0.2  0.0 - 1.0 mg/dL    Nitrite  NEGATIVE  NEGATIVE    Leukocytes, UA  NEGATIVE  NEGATIVE   URINE MICROSCOPIC-ADD ON Status: Abnormal    Collection Time    05/15/14 5:08 PM   Result  Value  Ref Range    Squamous Epithelial / LPF  RARE  RARE     WBC, UA  0-2  <3 WBC/hpf    RBC / HPF  0-2  <3 RBC/hpf    Bacteria, UA  RARE  RARE    Casts  GRANULAR CAST (*)  NEGATIVE    Urine-Other  AMORPHOUS URATES/PHOSPHATES    CBC Status: Abnormal    Collection Time    05/15/14 5:10 PM   Result  Value  Ref Range    WBC  14.9 (*)  4.0 - 10.5 K/uL    RBC  4.61  4.22 - 5.81 MIL/uL    Hemoglobin  14.2  13.0 - 17.0 g/dL    HCT  43.1  39.0 - 52.0 %    MCV  93.5  78.0 - 100.0 fL    MCH  30.8  26.0 - 34.0 pg    MCHC  32.9  30.0 - 36.0 g/dL    RDW  13.9  11.5 - 15.5 %    Platelets  96 (*)  150 - 400 K/uL   CREATININE, SERUM Status: Abnormal    Collection Time    05/15/14 5:10 PM   Result  Value  Ref Range    Creatinine, Ser  0.99  0.50 - 1.35 mg/dL    GFR calc non Af Amer  86 (*)  >90 mL/min    GFR calc Af Amer  >90  >90 mL/min   CBC Status: Abnormal    Collection Time    05/16/14 5:08 AM   Result  Value  Ref Range    WBC  13.0 (*)  4.0 - 10.5 K/uL    RBC  4.21 (*)  4.22 - 5.81 MIL/uL    Hemoglobin  12.9 (*)  13.0 - 17.0 g/dL    HCT  39.8  39.0 - 52.0 %    MCV  94.5  78.0 - 100.0 fL    MCH  30.6  26.0 - 34.0 pg    MCHC  32.4  30.0 - 36.0  g/dL    RDW  14.0  11.5 - 15.5 %    Platelets  200  150 - 400 K/uL   BASIC METABOLIC PANEL Status: Abnormal    Collection Time    05/16/14 5:08 AM   Result  Value  Ref Range    Sodium  142  137 - 147 mEq/L    Potassium  4.7  3.7 - 5.3 mEq/L    Chloride  104  96 - 112 mEq/L    CO2  29  19 - 32 mEq/L    Glucose, Bld  144 (*)  70 - 99 mg/dL    BUN  19  6 - 23 mg/dL    Creatinine, Ser  1.22  0.50 - 1.35 mg/dL    Calcium  8.3 (*)  8.4 - 10.5 mg/dL    GFR calc non Af Amer  62 (*)  >90 mL/min    GFR calc Af Amer  72 (*)  >90 mL/min    Anion gap  9  5 - 15    Dg Knee Right Port  05/15/2014 CLINICAL DATA: 63 year old male status post knee arthroplasty. Initial encounter. EXAM: PORTABLE RIGHT KNEE - 1-2 VIEW COMPARISON: Preoperative MRI 04/07/2014. FINDINGS: Portable AP and cross-table lateral views at  1300 hrs. Sequelae of right total knee arthroplasty. Hardware appears intact and normally aligned. Postoperative drain in place. Postoperative changes to the surrounding soft tissues. IMPRESSION: Right total knee arthroplasty, no adverse features identified. Electronically Signed By: Lars Pinks M.D. On: 05/15/2014 13:09   Assessment/Plan:  Diagnosis: End stage osteoarthritis right knee postop day #1 status post knee replacement  1. Does the need for close, 24 hr/day medical supervision in concert with the patient's rehab needs make it unreasonable for this patient to be served in a less intensive setting? Yes 2. Co-Morbidities requiring supervision/potential complications: Morbid obesity, recent left TK R. 3. Due to bowel management, safety, skin/wound care, disease management, medication administration, pain management and patient education, does the patient require 24 hr/day rehab nursing? Yes 4. Does the patient require coordinated care of a physician, rehab nurse, PT (1-2 hrs/day, 5 days/week) and OT (1-2 hrs/day, 5 days/week) to address physical and functional deficits in the context of the above medical diagnosis(es)? Yes Addressing deficits in the following areas: balance, endurance, locomotion, strength, transferring, bowel/bladder control, bathing, dressing, feeding, grooming and toileting 5. Can the patient actively participate in an intensive therapy program of at least 3 hrs of therapy per day at least 5 days per week? Yes 6. The potential for patient to make measurable gains while on inpatient rehab is good 7. Anticipated functional outcomes upon discharge from inpatient rehab are modified independent with PT, modified independent with OT, n/a with SLP. 8. Estimated rehab length of stay to reach the above functional goals is: 7-10 days 9. Does the patient have adequate social supports to accommodate these discharge functional goals? Yes and Potentially 10. Anticipated D/C setting:  Home 11. Anticipated post D/C treatments: [place] therapy 12. Overall Rehab/Functional Prognosis: excellent RECOMMENDATIONS:  This patient's condition is appropriate for continued rehabilitative care in the following setting: CIR  Patient has agreed to participate in recommended program. Yes  Note that insurance prior authorization may be required for reimbursement for recommended care.  Comment: Should be ready postop day 2 or 3 if tolerating therapy  05/16/2014  Revision History...      Date/Time User Action    05/16/2014 6:00 PM Charlett Blake, MD Sign    05/16/2014 4:41  PM Bary Leriche, PA-C Share   View Details Report    Routing History.Marland KitchenMarland Kitchen

## 2014-05-17 NOTE — Progress Notes (Signed)
Pt will be admitted to CIR today.  [address], [place]

## 2014-05-17 NOTE — PMR Pre-admission (Signed)
PMR Admission Coordinator Pre-Admission Assessment  Patient: Jonathan Forbes is an 63 y.o., male MRN: [id number] DOB: May 08, 1951 Height: 5\' 11"  (180.3 cm) Weight: 154.677 kg (341 lb)              Insurance Information HMO: No    PPO:       PCP:       IPA:       80/20:       OTHER:   PRIMARY: Medicare A/B      Policy#: [id number]      Subscriber: Lubertha South CM Name:        Phone#:       Fax#:   Pre-Cert#:        Employer: Disabled Benefits:  Phone #:       Name: Checked with Economist. Date: 08/25/12=A and 01/23/14=B     Deduct: $1260      Out of Pocket Max: none      Life Max: unlimited CIR: 100%      SNF: 100 days Outpatient: 80%     Co-Pay: 20% Home Health: 100%      Co-Pay: none DME: 80%     Co-Pay: 20% Providers: patient's choice  SECONDARY: Medicaid of [place]      Policy#: [id number]      Subscriber: Lubertha South CM Name:        Phone#:       Fax#:   Pre-Cert#:        Employer: Disabled Benefits:  Phone #: [phone]     Name: Automated  Eff. Date: Eligible 05/17/14     Deduct:        Out of Pocket Max:        Life Max:   CIR:        SNF:   Outpatient:       Co-Pay:   Home Health:        Co-Pay:   DME:       Co-Pay:     Emergency Contact Information Contact Information   Name Relation Home Work Mobile   Blevens,Chris Son   [phone]     Current Medical History  Patient Admitting Diagnosis:  R TKR, morbid obesity   History of Present Illness: A 63 y.o. 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 He was admitted on 05/15/14 for R-TKR and is WBAT RLE. Post op with ABLA as well as reactive leucocytosis and problems with pain control. Therapy initiated and CIR recommended by Rehab as well as MD. Patient to be admitted today for comprehensive inpatient rehab therapies.    Past Medical History  Past Medical History  Diagnosis Date  . Hypertension   . Depression   . Glaucoma     Hx; of beginning  stages  . Eczema     Hx; of  . Anxiety   . Mitral valve regurgitation     Hx; of slight  . Heart murmur   . OSA (obstructive sleep apnea)     not using CPAP at present, will have a sleep study in August  . OA (osteoarthritis) of knee     "both" (05/16/2014)  . Skin cancer 08/2013    "back"    Family History  family history includes Heart disease in his father; Hypertension in his father.  Prior Rehab/Hospitalizations:  Was on CIR 02/15 after L TKR   Current Medications  Current facility-administered medications:0.9 %  sodium  chloride infusion, , Intravenous, Continuous, Naiping Eduard Roux, MD, Last Rate: 125 mL/hr at 05/16/14 0716;  acetaminophen (TYLENOL) suppository 650 mg, 650 mg, Rectal, Q6H PRN, Marianna Payment, MD;  acetaminophen (TYLENOL) tablet 650 mg, 650 mg, Oral, Q6H PRN, Naiping Eduard Roux, MD alum & mag hydroxide-simeth (MAALOX/MYLANTA) 200-200-20 MG/5ML suspension 30 mL, 30 mL, Oral, Q4H PRN, Naiping Eduard Roux, MD;  amLODipine (NORVASC) tablet 10 mg, 10 mg, Oral, q morning - 10a, Naiping Eduard Roux, MD, 10 mg at 05/17/14 0940;  carvedilol (COREG) tablet 25 mg, 25 mg, Oral, BID WC, Naiping Eduard Roux, MD, 25 mg at 05/17/14 0940;  celecoxib (CELEBREX) capsule 200 mg, 200 mg, Oral, Q12H, Naiping Eduard Roux, MD, 200 mg at 05/17/14 0940 diphenhydrAMINE (BENADRYL) 12.5 MG/5ML elixir 25 mg, 25 mg, Oral, Q4H PRN, Marianna Payment, MD;  enoxaparin (LOVENOX) injection 30 mg, 30 mg, Subcutaneous, Q12H, Naiping Eduard Roux, MD, 30 mg at 05/16/14 2216;  furosemide (LASIX) tablet 40 mg, 40 mg, Oral, q morning - 10a, Naiping Eduard Roux, MD, 40 mg at 05/17/14 0940;  ketorolac (TORADOL) 30 MG/ML injection 30 mg, 30 mg, Intravenous, Q6H PRN, Naiping Eduard Roux, MD lisinopril (PRINIVIL,ZESTRIL) tablet 40 mg, 40 mg, Oral, q morning - 10a, Naiping Eduard Roux, MD, 40 mg at 05/17/14 0940;  menthol-cetylpyridinium (CEPACOL) lozenge 3 mg, 1 lozenge, Oral, PRN, Naiping Eduard Roux, MD;  methocarbamol  (ROBAXIN) 500 mg in dextrose 5 % 50 mL IVPB, 500 mg, Intravenous, Q6H PRN, Marianna Payment, MD, 500 mg at 05/15/14 1237 methocarbamol (ROBAXIN) tablet 500 mg, 500 mg, Oral, Q6H PRN, Marianna Payment, MD, 500 mg at 05/17/14 8341;  metoCLOPramide (REGLAN) injection 5-10 mg, 5-10 mg, Intravenous, Q8H PRN, Naiping Eduard Roux, MD;  metoCLOPramide (REGLAN) tablet 5-10 mg, 5-10 mg, Oral, Q8H PRN, Naiping Eduard Roux, MD;  morphine 2 MG/ML injection 2 mg, 2 mg, Intravenous, Q2H PRN, Naiping Eduard Roux, MD, 2 mg at 05/16/14 0716 ondansetron [facility]) injection 4 mg, 4 mg, Intravenous, Q6H PRN, Marianna Payment, MD;  ondansetron [facility]) tablet 4 mg, 4 mg, Oral, Q6H PRN, Marianna Payment, MD;  oxyCODONE (Oxy IR/ROXICODONE) immediate release tablet 5-10 mg, 5-10 mg, Oral, Q3H PRN, Marianna Payment, MD, 10 mg at 05/17/14 0940;  OxyCODONE (OXYCONTIN) 12 hr tablet 10 mg, 10 mg, Oral, Q12H, Naiping Eduard Roux, MD, 10 mg at 05/17/14 0940 phenol (CHLORASEPTIC) mouth spray 1 spray, 1 spray, Mouth/Throat, PRN, Naiping Eduard Roux, MD;  polyethylene glycol (MIRALAX / GLYCOLAX) packet 17 g, 17 g, Oral, Daily PRN, Naiping Eduard Roux, MD;  senna [facility]) tablet 8.6 mg, 1 tablet, Oral, BID, Naiping Eduard Roux, MD, 8.6 mg at 05/17/14 9622;  sorbitol 70 % solution 30 mL, 30 mL, Oral, Daily PRN, Naiping Eduard Roux, MD Vilazodone HCl (VIIBRYD) TABS 40 mg, 40 mg, Oral, Daily, Naiping Eduard Roux, MD, 40 mg at 05/17/14 0940  Patients Current Diet: General  Precautions / Restrictions Precautions Precautions: Fall;Knee Restrictions Weight Bearing Restrictions: Yes RLE Weight Bearing: Weight bearing as tolerated   Prior Activity Level Community (5-7x/wk): Went out daily.  Home Assistive Devices / Equipment Home Assistive Devices/Equipment: Eyeglasses;Grab bars in shower Home Equipment: Walker - 2 wheels;Grab bars - tub/shower;Shower seat;Tub bench;Adaptive equipment  Prior Functional Level Prior Function Level of  Independence: Independent  Current Functional Level Cognition  Overall Cognitive Status: Within Functional Limits for tasks assessed Orientation Level: Oriented X4    Extremity Assessment (includes Sensation/Coordination)  Upper Extremity Assessment: Defer to OT evaluation  Lower Extremity Assessment: RLE  deficits/detail  RLE Deficits / Details: less than 3/5 due to pain     ADLs  Overall ADL's : Needs assistance/impaired Eating/Feeding: Independent;Sitting Grooming: Set up;Sitting Upper Body Bathing: Set up;Sitting Lower Body Bathing: Maximal assistance;Sit to/from stand Upper Body Dressing : Set up;Sitting Lower Body Dressing: Total assistance;Sit to/from stand Toilet Transfer: +2 for physical assistance;Moderate assistance;Ambulation;RW (stand over toilet with RW) Toileting- Clothing Manipulation and Hygiene: Total assistance;Sit to/from stand    Mobility  Overal bed mobility: Needs Assistance Bed Mobility: Supine to Sit Sidelying to sit: HOB elevated;Mod assist Supine to sit: Min assist Sit to supine: Mod assist;+2 for physical assistance General bed mobility comments: assist to help RLE OOB. Requires extra time to bring trunk up into sitting.     Transfers  Overall transfer level: Needs assistance Equipment used: Rolling walker (2 wheeled) Transfers: Sit to/from Stand Sit to Stand: +2 physical assistance;Mod assist Stand pivot transfers: +2 physical assistance;Min assist General transfer comment: verbal cues for pt to use rocking mometum to get up. Pt was able to help with standing a little more today but still needs +2    Ambulation / Gait / Stairs / Wheelchair Mobility  Ambulation/Gait Ambulation/Gait assistance: Physicist, medical (Feet): 60 Feet Assistive device: Rolling walker (2 wheeled) Gait Pattern/deviations: Step-to pattern;Decreased stride length Gait velocity: decreased Gait velocity interpretation: Below normal speed for age/gender General  Gait Details: verbal cues for posture to stand more erect and to remember to breathe    Posture / Balance Overall balance assessment: Needs assistance  Sitting-balance support: No upper extremity supported  Sitting balance-Leahy Scale: Good  Standing balance support: Bilateral upper extremity supported  Standing balance-Leahy Scale: Poor  Standing balance comment: Requires support of walker    Special needs/care consideration BiPAP/CPAP Has OSA, but CPAP is broken and he needs to have a new study CPM No Continuous Drip IV No  Dialysis No      Life Vest No Oxygen No Special Bed No Trach Size No Wound Vac (area) No      Skin Has old healed L TK incision, new R TK incision.  Has bumps on his legs at times that might be eczema per patient.                            Bowel mgmt: No BM since admission Bladder mgmt: Voiding in urinal Diabetic mgmt No    Previous Home Environment Living Arrangements: Alone Available Help at Discharge: Family;Friend(s);Available PRN/intermittently Type of Home: Apartment Home Layout: One level Home Access: Elevator Bathroom Shower/Tub: Optometrist: Yes Home Care Services: No Additional Comments: Pt.reports he had a VAC for a surgical wound on his back about 3 months ago, surgical wound from skin cancer removal  Discharge Living Setting Plans for Discharge Living Setting: Apartment (Section 8 highrise apt on the 3rd floor with elevator.) Type of Home at Discharge: Apartment Discharge Home Layout: One level Discharge Home Access: Level entry;Elevator Does the patient have any problems obtaining your medications?: No  Social/Family/Support Systems Patient Roles: Parent (Divorced.  Has 1 son, is extranged from 1 daughter.) Contact Information: Gamble Enderle - son [phone] Anticipated Caregiver: self Ability/Limitations of Caregiver: Has friends who can assist some.  Son can assist when not  working.  Has a daughter, Magda Paganini, but says he does no interact with her and that we should not call her. Caregiver Availability: Other (Comment) (Can call friends to assist.) Discharge  Plan Discussed with Primary Caregiver: Yes Is Caregiver In Agreement with Plan?: Yes Does Caregiver/Family have Issues with Lodging/Transportation while Pt is in Rehab?: No  Goals/Additional Needs Patient/Family Goal for Rehab:  PT/OT mod I goals Expected length of stay: 7-10 days Cultural Considerations: Christian Dietary Needs: Regular diet with thin liquids Equipment Needs: TBD Pt/Family Agrees to Admission and willing to participate: Yes Program Orientation Provided & Reviewed with Pt/Caregiver Including Roles  & Responsibilities: Yes   Decrease burden of Care through IP rehab admission: N/A   Possible need for SNF placement upon discharge: Not planned   Patient Condition: This patient's condition remains as documented in the consult dated 05/16/14, in which the Rehabilitation Physician determined and documented that the patient's condition is appropriate for intensive rehabilitative care in an inpatient rehabilitation facility. Will admit to inpatient rehab today.  Preadmission Screen Completed By:  Retta Diones, 05/17/2014 11:45 AM ______________________________________________________________________   Discussed status with Dr. Naaman Plummer on 05/17/14 at 1159 and received telephone approval for admission today.  Admission Coordinator:  Retta Diones, time1159/Date07/24/15

## 2014-05-17 NOTE — H&P (View-Only) (Signed)
Physical Medicine and Rehabilitation Admission H&P    CC: Endstage DJD right knee  HPI:  Jonathan Forbes is a 63 y.o. 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 He was admitted on 05/15/14 for R-TKR and is WBAT RLE. Post op with ABLA as well as reactive leucocytosis and problems with pain control. Therapy initiated and CIR recommended by Rehab as well as MD. Patient admitted today for comprehensive inpatient therapies.     Review of Systems  HENT: Negative for hearing loss.   Eyes: Positive for blurred vision (chronic). Negative for double vision.  Respiratory: Negative for cough and shortness of breath.   Cardiovascular: Negative for chest pain and palpitations.  Gastrointestinal: Positive for constipation. Negative for heartburn, nausea, vomiting and abdominal pain.  Genitourinary: Negative for urgency and frequency.  Musculoskeletal: Positive for joint pain and myalgias.  Neurological: Negative for dizziness, tingling and headaches.  Psychiatric/Behavioral: Negative for depression. The patient is nervous/anxious and has insomnia (chronic).      Past Medical History  Diagnosis Date  . Hypertension   . Depression   . Glaucoma     Hx; of beginning stages  . Eczema     Hx; of  . Anxiety   . Mitral valve regurgitation     Hx; of slight  . Heart murmur   . OSA (obstructive sleep apnea)     not using CPAP at present, will have a sleep study in August  . OA (osteoarthritis) of knee     "both" (05/16/2014)  . Skin cancer 08/2013    "back"   Past Surgical History  Procedure Laterality Date  . Tumor excision      skin cancer of right side of back  . Cataract extraction w/ intraocular lens  implant, bilateral Bilateral   . Colonoscopy      Hx; of  . Tonsillectomy    . Total knee arthroplasty Left 12/05/2013    Procedure: LEFT TOTAL KNEE ARTHROPLASTY;  Surgeon: Marianna Payment, MD;  Location: [facility];   Service: Orthopedics;  Laterality: Left;  Marland Kitchen Eye surgery    . Joint replacement    . Breath tek h pylori N/A 05/14/2014    Procedure: BREATH TEK H PYLORI;  Surgeon: Pedro Earls, MD;  Location: [facility];  Service: General;  Laterality: N/A;  . Total knee arthroplasty Right 05/15/2014    Procedure: RIGHT TOTAL KNEE ARTHROPLASTY;  Surgeon: Marianna Payment, MD;  Location: [facility];  Service: Orthopedics;  Laterality: Right;  . Skin cancer excision  08/2013    "off my back; not melanoma"   Family History  Problem Relation Age of Onset  . Heart disease Father   . Hypertension Father    Social History: Lives alone and independent PTA. Son in college at [employer] and is home for next two weeks. He reports that he has never smoked. He has never used smokeless tobacco. He reports that he drinks alcohol. He reports that he does not use illicit drugs.    Allergies: No Known Allergies  Medications Prior to Admission  Medication Sig Dispense Refill  . amLODipine (NORVASC) 10 MG tablet Take 10 mg by mouth every morning.       . carvedilol (COREG) 25 MG tablet Take 25 mg by mouth 2 (two) times daily with a meal.      . furosemide (LASIX) 40 MG tablet Take 40 mg by mouth every morning.       Marland Kitchen  lisinopril (PRINIVIL,ZESTRIL) 40 MG tablet Take 40 mg by mouth every morning.       . traMADol (ULTRAM) 50 MG tablet Take 50 mg by mouth 4 (four) times daily.      . Vilazodone HCl (VIIBRYD) 40 MG TABS Take 40 mg by mouth daily.        Home: Home Living Family/patient expects to be discharged to:: [name] ("will go somewhere for rehab") Living Arrangements: Alone Available Help at Discharge: Family;Friend(s);Available PRN/intermittently Type of Home: Apartment Home Access: Elevator Home Layout: One level Home Equipment: Walker - 2 wheels;Grab bars - tub/shower;Shower seat;Tub bench;Adaptive equipment Adaptive Equipment: Reacher;Sock aid;Long-handled shoe horn;Long-handled sponge Additional Comments:  Pt.reports he had a VAC for a surgical wound on his back about 3 months ago, surgical wound from skin cancer removal   Functional History: Prior Function Level of Independence: Independent  Functional Status:  Mobility: Bed Mobility Overal bed mobility: Needs Assistance;+2 for physical assistance Bed Mobility: Sit to Supine Sidelying to sit: HOB elevated;Mod assist Supine to sit: Mod assist Sit to supine: Mod assist;+2 for physical assistance General bed mobility comments: Assist to support and move RLE and to bring trunk up into sitting. Transfers Overall transfer level: Needs assistance Equipment used: Rolling walker (2 wheeled) Transfers: Sit to/from Stand Sit to Stand: +2 physical assistance;Max assist Stand pivot transfers: +2 physical assistance;Min assist General transfer comment: Difficulty bringing hips up from low recliner. Recliner had 2 pillows in seat already but added 2 more blankets to raise seat height. Verbal cues for pt to use rocking momentum to get up. Ambulation/Gait Ambulation/Gait assistance: Min assist;+2 safety/equipment Ambulation Distance (Feet): 20 Feet Assistive device: Rolling walker (2 wheeled) Gait Pattern/deviations: Step-to pattern;Decreased step length - right;Decreased stance time - right;Antalgic;Trunk flexed Gait velocity: decr Gait velocity interpretation: Below normal speed for age/gender General Gait Details: Verbal cues for technique and to stand more erect.    ADL: ADL Overall ADL's : Needs assistance/impaired Eating/Feeding: Independent;Sitting Grooming: Set up;Sitting Upper Body Bathing: Set up;Sitting Lower Body Bathing: Maximal assistance;Sit to/from stand Upper Body Dressing : Set up;Sitting Lower Body Dressing: Total assistance;Sit to/from stand Toilet Transfer: +2 for physical assistance;Moderate assistance;Ambulation;RW (stand over toilet with RW) Toileting- Clothing Manipulation and Hygiene: Total assistance;Sit to/from  stand  Cognition: Cognition Overall Cognitive Status: Within Functional Limits for tasks assessed Orientation Level: Oriented X4 Cognition Arousal/Alertness: Awake/alert Behavior During Therapy: WFL for tasks assessed/performed Overall Cognitive Status: Within Functional Limits for tasks assessed  Physical Exam: Blood pressure 113/64, pulse 75, temperature 97.4 F (36.3 C), temperature source Oral, resp. rate 16, height '5\' 11"'  (1.803 m), weight 154.677 kg (341 lb), SpO2 93.00%. Physical Exam Constitutional: He is oriented to person, place, and time. He appears well-developed and well-nourished. Morbidly obese HENT: dentition poor, oral mucosa pink/moist Head: Normocephalic and atraumatic.  Eyes: EOM are normal. Pupils are equal, round, and reactive to light.  Neck: Normal range of motion. Neck supple.  Cardiovascular: Normal rate and regular rhythm.  No murmur heard.  Respiratory: Effort normal. He has decreased breath sounds in the right lower field and the left lower field.  GI: Soft. Normal appearance. He exhibits mild distension. Bowel sounds are decreased. There is generalized tenderness.  Musculoskeletal:  Right knee with foam dressing--wound well-approximated with minimal drainage. .  Neurological: He is alert and oriented to person, place, and time.  MMT: 5/5 bilateral deltoid, bicep, tricep, grip  2 minus right hip flexor, knee extensor not assessed secondary to dressing, 3 minus right ankle dorsiflexor plantar flexor  4+ left hip flexor knee extensor ankle dorsiflexor plantar flexor  Sensory equal intact bilateral feet Skin: incision intact, prior left TKA scar well-healed Psych: pleasant and cooperative  Results for orders placed during the hospital encounter of 05/15/14 (from the past 48 hour(s))  URINALYSIS, ROUTINE W REFLEX MICROSCOPIC     Status: Abnormal   Collection Time    05/15/14  5:08 PM      Result Value Ref Range   Color, Urine YELLOW  YELLOW    APPearance CLOUDY (*) CLEAR   Specific Gravity, Urine 1.023  1.005 - 1.030   pH 5.0  5.0 - 8.0   Glucose, UA NEGATIVE  NEGATIVE mg/dL   Hgb urine dipstick NEGATIVE  NEGATIVE   Bilirubin Urine NEGATIVE  NEGATIVE   Ketones, ur NEGATIVE  NEGATIVE mg/dL   Protein, ur 30 (*) NEGATIVE mg/dL   Urobilinogen, UA 0.2  0.0 - 1.0 mg/dL   Nitrite NEGATIVE  NEGATIVE   Leukocytes, UA NEGATIVE  NEGATIVE  URINE MICROSCOPIC-ADD ON     Status: Abnormal   Collection Time    05/15/14  5:08 PM      Result Value Ref Range   Squamous Epithelial / LPF RARE  RARE   WBC, UA 0-2  <3 WBC/hpf   RBC / HPF 0-2  <3 RBC/hpf   Bacteria, UA RARE  RARE   Casts GRANULAR CAST (*) NEGATIVE   Urine-Other AMORPHOUS URATES/PHOSPHATES    CBC     Status: Abnormal   Collection Time    05/15/14  5:10 PM      Result Value Ref Range   WBC 14.9 (*) 4.0 - 10.5 K/uL   RBC 4.61  4.22 - 5.81 MIL/uL   Hemoglobin 14.2  13.0 - 17.0 g/dL   HCT 43.1  39.0 - 52.0 %   MCV 93.5  78.0 - 100.0 fL   MCH 30.8  26.0 - 34.0 pg   MCHC 32.9  30.0 - 36.0 g/dL   RDW 13.9  11.5 - 15.5 %   Platelets 96 (*) 150 - 400 K/uL   Comment: PLATELET COUNT CONFIRMED BY SMEAR     REPEATED TO VERIFY     SPECIMEN CHECKED FOR CLOTS  CREATININE, SERUM     Status: Abnormal   Collection Time    05/15/14  5:10 PM      Result Value Ref Range   Creatinine, Ser 0.99  0.50 - 1.35 mg/dL   GFR calc non Af Amer 86 (*) >90 mL/min   GFR calc Af Amer >90  >90 mL/min   Comment: (NOTE)     The eGFR has been calculated using the CKD EPI equation.     This calculation has not been validated in all clinical situations.     eGFR's persistently <90 mL/min signify possible Chronic Kidney     Disease.  CBC     Status: Abnormal   Collection Time    05/16/14  5:08 AM      Result Value Ref Range   WBC 13.0 (*) 4.0 - 10.5 K/uL   RBC 4.21 (*) 4.22 - 5.81 MIL/uL   Hemoglobin 12.9 (*) 13.0 - 17.0 g/dL   HCT 39.8  39.0 - 52.0 %   MCV 94.5  78.0 - 100.0 fL   MCH 30.6  26.0 -  34.0 pg   MCHC 32.4  30.0 - 36.0 g/dL   RDW 14.0  11.5 - 15.5 %   Platelets 200  150 - 400 K/uL  Comment: REPEATED TO VERIFY     DELTA CHECK NOTED  BASIC METABOLIC PANEL     Status: Abnormal   Collection Time    05/16/14  5:08 AM      Result Value Ref Range   Sodium 142  137 - 147 mEq/L   Potassium 4.7  3.7 - 5.3 mEq/L   Chloride 104  96 - 112 mEq/L   CO2 29  19 - 32 mEq/L   Glucose, Bld 144 (*) 70 - 99 mg/dL   BUN 19  6 - 23 mg/dL   Creatinine, Ser 1.22  0.50 - 1.35 mg/dL   Calcium 8.3 (*) 8.4 - 10.5 mg/dL   GFR calc non Af Amer 62 (*) >90 mL/min   GFR calc Af Amer 72 (*) >90 mL/min   Comment: (NOTE)     The eGFR has been calculated using the CKD EPI equation.     This calculation has not been validated in all clinical situations.     eGFR's persistently <90 mL/min signify possible Chronic Kidney     Disease.   Anion gap 9  5 - 15  CBC     Status: Abnormal   Collection Time    05/17/14  6:00 AM      Result Value Ref Range   WBC 10.6 (*) 4.0 - 10.5 K/uL   RBC 3.79 (*) 4.22 - 5.81 MIL/uL   Hemoglobin 11.3 (*) 13.0 - 17.0 g/dL   HCT 35.6 (*) 39.0 - 52.0 %   MCV 93.9  78.0 - 100.0 fL   MCH 29.8  26.0 - 34.0 pg   MCHC 31.7  30.0 - 36.0 g/dL   RDW 14.2  11.5 - 15.5 %   Platelets 180  150 - 400 K/uL   Dg Knee Right Port  05/15/2014   CLINICAL DATA:  63 year old male status post knee arthroplasty. Initial encounter.  EXAM: PORTABLE RIGHT KNEE - 1-2 VIEW  COMPARISON:  Preoperative MRI 04/07/2014.  FINDINGS: Portable AP and cross-table lateral views at 1300 hrs. Sequelae of right total knee arthroplasty. Hardware appears intact and normally aligned. Postoperative drain in place. Postoperative changes to the surrounding soft tissues.  IMPRESSION: Right total knee arthroplasty, no adverse features identified.   Electronically Signed   By: Lars Pinks M.D.   On: 05/15/2014 13:09       Medical Problem List and Plan: 1. Functional deficits secondary to Endstage OA right Knee s/p  TKR 2.  DVT Prophylaxis/Anticoagulation: Pharmaceutical: Lovenox 3. Pain Management: Continue celebrex for now. Increase oxycontin 4. H/o depression with anxiety disorder/Mood: Continue Viibryd. Team to provide ego support. LCSW to follow for evaluation and support.  5. Neuropsych: This patient is capable of making decisions on his own behalf. 6. Skin/Wound Care: Monitor daily for stability, any signs of infection or breakdown. .  7.  Reactive leucocytosis: Resolving. No signs of infection noted. Will recheck on 07/27 8. ABLA: Monitor for stability. Will recheck labs on 07/27 9. HTN: Monitor every 8 hours. Continue  Lasix, lisinopril and coreg. Monitor renal status with addition of celebrex.  10. Post op ilues?: has minimal bowel sound and no BM so far. SSE today if no BM by supper. Aggressive bowel program for now to avoid ileus 9. Morbid obesity: Pressure relief measures. Bariatric AE.       Post Admission Physician Evaluation: 1. Functional deficits secondary  to endstage OA right knee s/p TKA. 2. Patient is admitted to receive collaborative, interdisciplinary care between the physiatrist, rehab  nursing staff, and therapy team. 3. Patient's level of medical complexity and substantial therapy needs in context of that medical necessity cannot be provided at a lesser intensity of care such as a SNF. 4. Patient has experienced substantial functional loss from his/her baseline which was documented above under the "Functional History" and "Functional Status" headings.  Judging by the patient's diagnosis, physical exam, and functional history, the patient has potential for functional progress which will result in measurable gains while on inpatient rehab.  These gains will be of substantial and practical use upon discharge  in facilitating mobility and self-care at the household level. 5. Physiatrist will provide 24 hour management of medical needs as well as oversight of the therapy plan/treatment  and provide guidance as appropriate regarding the interaction of the two. 6. 24 hour rehab nursing will assist with bladder management, bowel management, safety, skin/wound care, disease management, medication administration, pain management and patient education  and help integrate therapy concepts, techniques,education, etc. 7. PT will assess and treat for/with: Lower extremity strength, range of motion, stamina, balance, functional mobility, safety, adaptive techniques and equipment, Knee ROM, pain mgt, egosupport, community reintegration.   Goals are: mod I. 8. OT will assess and treat for/with: ADL's, functional mobility, safety, upper extremity strength, adaptive techniques and equipment, pain mgt, leisure awareness, egosupport.   Goals are: mod I. 9. SLP will assess and treat for/with: n/a.  Goals are: n/a. 10. Case Management and Social Worker will assess and treat for psychological issues and discharge planning. 11. Team conference will be held weekly to assess progress toward goals and to determine barriers to discharge. 12. Patient will receive at least 3 hours of therapy per day at least 5 days per week. 13. ELOS: 7 days       14. Prognosis:  excellent     Meredith Staggers, MD, [facility]   05/17/2014

## 2014-05-17 NOTE — Progress Notes (Signed)
Patient transferred to in-patient rehab from [facility]. Patient oriented to rehab, therapy schedule, and call bell. All questions answered, and patient assisted to recliner for comfort.

## 2014-05-17 NOTE — Progress Notes (Signed)
PMR Admission Coordinator Pre-Admission Assessment  Patient: Jonathan Forbes is an 63 y.o., male  MRN: [id number]  DOB: 09-Aug-1951  Height: 5\' 11"  (180.3 cm)  Weight: 154.677 kg (341 lb)  Insurance Information  HMO: No PPO: PCP: IPA: 80/20: OTHER:  PRIMARY: Medicare A/B Policy#: [id number] Subscriber: [place]  CM Name: Phone#: Fax#:  Pre-Cert#: Employer: Disabled  Benefits: Phone #: Name: Checked with Dance movement psychotherapist. Date: 08/25/12=A and 01/23/14=B Deduct: $1260 Out of Pocket Max: none Life Max: unlimited  CIR: 100% SNF: 100 days  Outpatient: 80% Co-Pay: 20%  Home Health: 100% Co-Pay: none  DME: 80% Co-Pay: 20%  Providers: patient's choice   SECONDARY: Medicaid of [place] Policy#: [id number] Subscriber: [place]  CM Name: Phone#: Fax#:  Pre-Cert#: Employer: Disabled  Benefits: Phone #: [phone] Name: Automated  Eff. Date: Eligible 05/17/14 Deduct: Out of Pocket Max: Life Max:  CIR: SNF:  Outpatient: Co-Pay:  Home Health: Co-Pay:  DME: Co-Pay:  Emergency Contact Information  Contact Information    Name  Relation  Home  Work  Mobile    Youngers,Chris  Son    [phone]      Current Medical History  Patient Admitting Diagnosis: R TKR, morbid obesity  History of Present Illness: A 63 y.o. 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 He was admitted on 05/15/14 for R-TKR and is WBAT RLE. Post op with ABLA as well as reactive leucocytosis and problems with pain control. Therapy initiated and CIR recommended by Rehab as well as MD. Patient to be admitted today for comprehensive inpatient rehab therapies.  Past Medical History  Past Medical History   Diagnosis  Date   .  Hypertension    .  Depression    .  Glaucoma      Hx; of beginning stages   .  Eczema      Hx; of   .  Anxiety    .  Mitral valve regurgitation      Hx; of slight   .  Heart murmur    .  OSA (obstructive sleep apnea)      not using  CPAP at present, will have a sleep study in August   .  OA (osteoarthritis) of knee      "both" (05/16/2014)   .  Skin cancer  08/2013     "back"    Family History  family history includes Heart disease in his father; Hypertension in his father.  Prior Rehab/Hospitalizations: Was on CIR 02/15 after L TKR  Current Medications  Current facility-administered medications:0.9 % sodium chloride infusion, , Intravenous, Continuous, Naiping Eduard Roux, MD, Last Rate: 125 mL/hr at 05/16/14 0716; acetaminophen (TYLENOL) suppository 650 mg, 650 mg, Rectal, Q6H PRN, Marianna Payment, MD; acetaminophen (TYLENOL) tablet 650 mg, 650 mg, Oral, Q6H PRN, Naiping Eduard Roux, MD  alum & mag hydroxide-simeth (MAALOX/MYLANTA) 200-200-20 MG/5ML suspension 30 mL, 30 mL, Oral, Q4H PRN, Naiping Eduard Roux, MD; amLODipine (NORVASC) tablet 10 mg, 10 mg, Oral, q morning - 10a, Naiping Eduard Roux, MD, 10 mg at 05/17/14 0940; carvedilol (COREG) tablet 25 mg, 25 mg, Oral, BID WC, Naiping Eduard Roux, MD, 25 mg at 05/17/14 0940; celecoxib (CELEBREX) capsule 200 mg, 200 mg, Oral, Q12H, Naiping Eduard Roux, MD, 200 mg at 05/17/14 0940  diphenhydrAMINE (BENADRYL) 12.5 MG/5ML elixir 25 mg, 25 mg, Oral, Q4H PRN, Marianna Payment, MD; enoxaparin (LOVENOX) injection 30 mg, 30 mg, Subcutaneous, Q12H, Naiping  Eduard Roux, MD, 30 mg at 05/16/14 2216; furosemide (LASIX) tablet 40 mg, 40 mg, Oral, q morning - 10a, Naiping Eduard Roux, MD, 40 mg at 05/17/14 0940; ketorolac (TORADOL) 30 MG/ML injection 30 mg, 30 mg, Intravenous, Q6H PRN, Naiping Eduard Roux, MD  lisinopril (PRINIVIL,ZESTRIL) tablet 40 mg, 40 mg, Oral, q morning - 10a, Naiping Eduard Roux, MD, 40 mg at 05/17/14 0940; menthol-cetylpyridinium (CEPACOL) lozenge 3 mg, 1 lozenge, Oral, PRN, Marianna Payment, MD; methocarbamol (ROBAXIN) 500 mg in dextrose 5 % 50 mL IVPB, 500 mg, Intravenous, Q6H PRN, Marianna Payment, MD, 500 mg at 05/15/14 1237  methocarbamol (ROBAXIN) tablet 500 mg,  500 mg, Oral, Q6H PRN, Marianna Payment, MD, 500 mg at 05/17/14 1791; metoCLOPramide (REGLAN) injection 5-10 mg, 5-10 mg, Intravenous, Q8H PRN, Naiping Eduard Roux, MD; metoCLOPramide (REGLAN) tablet 5-10 mg, 5-10 mg, Oral, Q8H PRN, Naiping Eduard Roux, MD; morphine 2 MG/ML injection 2 mg, 2 mg, Intravenous, Q2H PRN, Naiping Eduard Roux, MD, 2 mg at 05/16/14 0716  ondansetron [facility]) injection 4 mg, 4 mg, Intravenous, Q6H PRN, Marianna Payment, MD; ondansetron [facility]) tablet 4 mg, 4 mg, Oral, Q6H PRN, Marianna Payment, MD; oxyCODONE (Oxy IR/ROXICODONE) immediate release tablet 5-10 mg, 5-10 mg, Oral, Q3H PRN, Marianna Payment, MD, 10 mg at 05/17/14 0940; OxyCODONE (OXYCONTIN) 12 hr tablet 10 mg, 10 mg, Oral, Q12H, Naiping Eduard Roux, MD, 10 mg at 05/17/14 0940  phenol (CHLORASEPTIC) mouth spray 1 spray, 1 spray, Mouth/Throat, PRN, Naiping Eduard Roux, MD; polyethylene glycol (MIRALAX / GLYCOLAX) packet 17 g, 17 g, Oral, Daily PRN, Naiping Eduard Roux, MD; senna [facility]) tablet 8.6 mg, 1 tablet, Oral, BID, Naiping Eduard Roux, MD, 8.6 mg at 05/17/14 5056; sorbitol 70 % solution 30 mL, 30 mL, Oral, Daily PRN, Naiping Eduard Roux, MD  Vilazodone HCl (VIIBRYD) TABS 40 mg, 40 mg, Oral, Daily, Naiping Eduard Roux, MD, 40 mg at 05/17/14 0940  Patients Current Diet: General  Precautions / Restrictions  Precautions  Precautions: Fall;Knee  Restrictions  Weight Bearing Restrictions: Yes  RLE Weight Bearing: Weight bearing as tolerated  Prior Activity Level  Community (5-7x/wk): Went out daily.  Home Assistive Devices / Equipment  Home Assistive Devices/Equipment: Eyeglasses;Grab bars in shower  Home Equipment: Walker - 2 wheels;Grab bars - tub/shower;Shower seat;Tub bench;Adaptive equipment  Prior Functional Level  Prior Function  Level of Independence: Independent  Current Functional Level  Cognition  Overall Cognitive Status: Within Functional Limits for tasks assessed  Orientation Level: Oriented X4    Extremity Assessment  (includes Sensation/Coordination)  Upper Extremity Assessment: Defer to OT evaluation  Lower Extremity Assessment: RLE deficits/detail  RLE Deficits / Details: less than 3/5 due to pain   ADLs  Overall ADL's : Needs assistance/impaired  Eating/Feeding: Independent;Sitting  Grooming: Set up;Sitting  Upper Body Bathing: Set up;Sitting  Lower Body Bathing: Maximal assistance;Sit to/from stand  Upper Body Dressing : Set up;Sitting  Lower Body Dressing: Total assistance;Sit to/from stand  Toilet Transfer: +2 for physical assistance;Moderate assistance;Ambulation;RW (stand over toilet with RW)  Toileting- Clothing Manipulation and Hygiene: Total assistance;Sit to/from stand   Mobility  Overal bed mobility: Needs Assistance  Bed Mobility: Supine to Sit  Sidelying to sit: HOB elevated;Mod assist  Supine to sit: Min assist  Sit to supine: Mod assist;+2 for physical assistance  General bed mobility comments: assist to help RLE OOB. Requires extra time to bring trunk up into sitting.   Transfers  Overall transfer level: Needs assistance  Equipment used: Rolling walker (  2 wheeled)  Transfers: Sit to/from Stand  Sit to Stand: +2 physical assistance;Mod assist  Stand pivot transfers: +2 physical assistance;Min assist  General transfer comment: verbal cues for pt to use rocking mometum to get up. Pt was able to help with standing a little more today but still needs +2   Ambulation / Gait / Stairs / Wheelchair Mobility  Ambulation/Gait  Ambulation/Gait assistance: Corporate investment banker (Feet): 60 Feet  Assistive device: Rolling walker (2 wheeled)  Gait Pattern/deviations: Step-to pattern;Decreased stride length  Gait velocity: decreased  Gait velocity interpretation: Below normal speed for age/gender  General Gait Details: verbal cues for posture to stand more erect and to remember to breathe   Posture / Balance  Overall balance assessment: Needs assistance   Sitting-balance support: No upper extremity supported  Sitting balance-Leahy Scale: Good  Standing balance support: Bilateral upper extremity supported  Standing balance-Leahy Scale: Poor  Standing balance comment: Requires support of walker   Special needs/care consideration  BiPAP/CPAP Has OSA, but CPAP is broken and he needs to have a new study  CPM No  Continuous Drip IV No  Dialysis No  Life Vest No  Oxygen No  Special Bed No  Trach Size No  Wound Vac (area) No  Skin Has old healed L TK incision, new R TK incision. Has bumps on his legs at times that might be eczema per patient.  Bowel mgmt: No BM since admission  Bladder mgmt: Voiding in urinal  Diabetic mgmt No   Previous Home Environment  Living Arrangements: Alone  Available Help at Discharge: Family;Friend(s);Available PRN/intermittently  Type of Home: Apartment  Home Layout: One level  Home Access: Elevator  Bathroom Shower/Tub: Futures trader: Yes  Home Care Services: No  Additional Comments: Pt.reports he had a VAC for a surgical wound on his back about 3 months ago, surgical wound from skin cancer removal  Discharge Living Setting  Plans for Discharge Living Setting: Apartment (Section 8 highrise apt on the 3rd floor with elevator.)  Type of Home at Discharge: Apartment  Discharge Home Layout: One level  Discharge Home Access: Level entry;Elevator  Does the patient have any problems obtaining your medications?: No  Social/Family/Support Systems  Patient Roles: Parent (Divorced. Has 1 son, is extranged from 1 daughter.)  Contact Information: Cohen Boettner - son [phone]  Anticipated Caregiver: self  Ability/Limitations of Caregiver: Has friends who can assist some. Son can assist when not working. Has a daughter, Magda Paganini, but says he does no interact with her and that we should not call her.  Caregiver Availability: Other (Comment) (Can call friends to  assist.)  Discharge Plan Discussed with Primary Caregiver: Yes  Is Caregiver In Agreement with Plan?: Yes  Does Caregiver/Family have Issues with Lodging/Transportation while Pt is in Rehab?: No  Goals/Additional Needs  Patient/Family Goal for Rehab: PT/OT mod I goals  Expected length of stay: 7-10 days  Cultural Considerations: Christian  Dietary Needs: Regular diet with thin liquids  Equipment Needs: TBD  Pt/Family Agrees to Admission and willing to participate: Yes  Program Orientation Provided & Reviewed with Pt/Caregiver Including Roles & Responsibilities: Yes  Decrease burden of Care through IP rehab admission: N/A  Possible need for SNF placement upon discharge: Not planned  Patient Condition: This patient's condition remains as documented in the consult dated 05/16/14, in which the Rehabilitation Physician determined and documented that the patient's condition is appropriate for intensive rehabilitative care in an inpatient rehabilitation  facility. Will admit to inpatient rehab today.  Preadmission Screen Completed By: Retta Diones, 05/17/2014 11:45 AM  ______________________________________________________________________  Discussed status with Dr. Naaman Plummer on 05/17/14 at 1159 and received telephone approval for admission today.  Admission Coordinator: Retta Diones, time1159/Date07/24/15  Cosigned by: Meredith Staggers, MD [05/17/2014 12:49 PM]

## 2014-05-17 NOTE — Progress Notes (Signed)
Physical Therapy Treatment Patient Details Name: Jonathan Forbes MRN: [id number] DOB: 1951/10/07 Today's Date: 05/17/2014    History of Present Illness Pt s/p rt TKA. Pt with lt TKA in 11/2013 requiring CIR stay. Pt morbidly obese.    PT Comments    Pt was able to come to sitting much better today and help with transfer to standing. Pt is still requiring +2, but progressed to only min A with bed mobility. Pt was able to amb a longer distance today and ws able to remember sequencing with RW.   Follow Up Recommendations  CIR     Equipment Recommendations  3in1 (PT)    Recommendations for Other Services       Precautions / Restrictions Precautions Precautions: Fall;Knee Required Braces or Orthoses: Knee Immobilizer - Right Restrictions RLE Weight Bearing: Weight bearing as tolerated    Mobility  Bed Mobility Overal bed mobility: Needs Assistance Bed Mobility: Supine to Sit     Supine to sit: Min assist     General bed mobility comments: assist to help RLE OOB. Requires extra time to bring trunk up into sitting.   Transfers Overall transfer level: Needs assistance Equipment used: Rolling walker (2 wheeled) Transfers: Sit to/from Stand Sit to Stand: +2 physical assistance;Mod assist         General transfer comment: verbal cues for pt to use rocking mometum to get up. Pt was able to help with standing a little more today but still needs +2  Ambulation/Gait Ambulation/Gait assistance: Min guard Ambulation Distance (Feet): 60 Feet Assistive device: Rolling walker (2 wheeled) Gait Pattern/deviations: Step-to pattern;Decreased stride length Gait velocity: decreased Gait velocity interpretation: Below normal speed for age/gender General Gait Details: verbal cues for posture to stand more erect and to remember to breathe   Stairs            Wheelchair Mobility    Modified Rankin (Stroke Patients Only)       Balance                                     Cognition Arousal/Alertness: Awake/alert Behavior During Therapy: WFL for tasks assessed/performed Overall Cognitive Status: Within Functional Limits for tasks assessed                      Exercises Total Joint Exercises Ankle Circles/Pumps: AROM;Seated;20 reps;Both Quad Sets: AROM;Seated;10 reps;Right Heel Slides: AAROM;Seated;5 reps;Right Hip ABduction/ADduction: AAROM;Seated;Right;10 reps Straight Leg Raises: AAROM;Seated;5 reps;Right Long Arc Quad: AAROM;Seated;5 reps;Right    General Comments        Pertinent Vitals/Pain no apparent distress. Pt repositioned in recliner for comfort in footsie roll with RN in room administering pain meds.      Home Living                      Prior Function            PT Goals (current goals can now be found in the care plan section) Progress towards PT goals: Progressing toward goals    Frequency  7X/week    PT Plan Current plan remains appropriate    Co-evaluation             End of Session Equipment Utilized During Treatment: Gait belt Activity Tolerance: Patient tolerated treatment well Patient left: in chair;with call bell/phone within reach;with nursing/sitter in room     Time: [phone] PT  Time Calculation (min): 33 min  Charges:                       G Codes:      BRASFIELD,Jerlean Peralta,SPTA 05/17/2014, 9:59 AM

## 2014-05-17 NOTE — H&P (Signed)
Physical Medicine and Rehabilitation Admission H&P    CC: Endstage DJD right knee  HPI:  Jonathan Forbes is a 63 y.o. 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 He was admitted on 05/15/14 for R-TKR and is WBAT RLE. Post op with ABLA as well as reactive leucocytosis and problems with pain control. Therapy initiated and CIR recommended by Rehab as well as MD. Patient admitted today for comprehensive inpatient therapies.     Review of Systems  HENT: Negative for hearing loss.   Eyes: Positive for blurred vision (chronic). Negative for double vision.  Respiratory: Negative for cough and shortness of breath.   Cardiovascular: Negative for chest pain and palpitations.  Gastrointestinal: Positive for constipation. Negative for heartburn, nausea, vomiting and abdominal pain.  Genitourinary: Negative for urgency and frequency.  Musculoskeletal: Positive for joint pain and myalgias.  Neurological: Negative for dizziness, tingling and headaches.  Psychiatric/Behavioral: Negative for depression. The patient is nervous/anxious and has insomnia (chronic).      Past Medical History  Diagnosis Date  . Hypertension   . Depression   . Glaucoma     Hx; of beginning stages  . Eczema     Hx; of  . Anxiety   . Mitral valve regurgitation     Hx; of slight  . Heart murmur   . OSA (obstructive sleep apnea)     not using CPAP at present, will have a sleep study in August  . OA (osteoarthritis) of knee     "both" (05/16/2014)  . Skin cancer 08/2013    "back"   Past Surgical History  Procedure Laterality Date  . Tumor excision      skin cancer of right side of back  . Cataract extraction w/ intraocular lens  implant, bilateral Bilateral   . Colonoscopy      Hx; of  . Tonsillectomy    . Total knee arthroplasty Left 12/05/2013    Procedure: LEFT TOTAL KNEE ARTHROPLASTY;  Surgeon: Marianna Payment, MD;  Location: [facility];   Service: Orthopedics;  Laterality: Left;  Marland Kitchen Eye surgery    . Joint replacement    . Breath tek h pylori N/A 05/14/2014    Procedure: BREATH TEK H PYLORI;  Surgeon: Pedro Earls, MD;  Location: [facility];  Service: General;  Laterality: N/A;  . Total knee arthroplasty Right 05/15/2014    Procedure: RIGHT TOTAL KNEE ARTHROPLASTY;  Surgeon: Marianna Payment, MD;  Location: [facility];  Service: Orthopedics;  Laterality: Right;  . Skin cancer excision  08/2013    "off my back; not melanoma"   Family History  Problem Relation Age of Onset  . Heart disease Father   . Hypertension Father    Social History: Lives alone and independent PTA. Son in college at [employer] and is home for next two weeks. He reports that he has never smoked. He has never used smokeless tobacco. He reports that he drinks alcohol. He reports that he does not use illicit drugs.    Allergies: No Known Allergies  Medications Prior to Admission  Medication Sig Dispense Refill  . amLODipine (NORVASC) 10 MG tablet Take 10 mg by mouth every morning.       . carvedilol (COREG) 25 MG tablet Take 25 mg by mouth 2 (two) times daily with a meal.      . furosemide (LASIX) 40 MG tablet Take 40 mg by mouth every morning.       Marland Kitchen  lisinopril (PRINIVIL,ZESTRIL) 40 MG tablet Take 40 mg by mouth every morning.       . traMADol (ULTRAM) 50 MG tablet Take 50 mg by mouth 4 (four) times daily.      . Vilazodone HCl (VIIBRYD) 40 MG TABS Take 40 mg by mouth daily.        Home: Home Living Family/patient expects to be discharged to:: [name] ("will go somewhere for rehab") Living Arrangements: Alone Available Help at Discharge: Family;Friend(s);Available PRN/intermittently Type of Home: Apartment Home Access: Elevator Home Layout: One level Home Equipment: Walker - 2 wheels;Grab bars - tub/shower;Shower seat;Tub bench;Adaptive equipment Adaptive Equipment: Reacher;Sock aid;Long-handled shoe horn;Long-handled sponge Additional Comments:  Pt.reports he had a VAC for a surgical wound on his back about 3 months ago, surgical wound from skin cancer removal   Functional History: Prior Function Level of Independence: Independent  Functional Status:  Mobility: Bed Mobility Overal bed mobility: Needs Assistance;+2 for physical assistance Bed Mobility: Sit to Supine Sidelying to sit: HOB elevated;Mod assist Supine to sit: Mod assist Sit to supine: Mod assist;+2 for physical assistance General bed mobility comments: Assist to support and move RLE and to bring trunk up into sitting. Transfers Overall transfer level: Needs assistance Equipment used: Rolling walker (2 wheeled) Transfers: Sit to/from Stand Sit to Stand: +2 physical assistance;Max assist Stand pivot transfers: +2 physical assistance;Min assist General transfer comment: Difficulty bringing hips up from low recliner. Recliner had 2 pillows in seat already but added 2 more blankets to raise seat height. Verbal cues for pt to use rocking momentum to get up. Ambulation/Gait Ambulation/Gait assistance: Min assist;+2 safety/equipment Ambulation Distance (Feet): 20 Feet Assistive device: Rolling walker (2 wheeled) Gait Pattern/deviations: Step-to pattern;Decreased step length - right;Decreased stance time - right;Antalgic;Trunk flexed Gait velocity: decr Gait velocity interpretation: Below normal speed for age/gender General Gait Details: Verbal cues for technique and to stand more erect.    ADL: ADL Overall ADL's : Needs assistance/impaired Eating/Feeding: Independent;Sitting Grooming: Set up;Sitting Upper Body Bathing: Set up;Sitting Lower Body Bathing: Maximal assistance;Sit to/from stand Upper Body Dressing : Set up;Sitting Lower Body Dressing: Total assistance;Sit to/from stand Toilet Transfer: +2 for physical assistance;Moderate assistance;Ambulation;RW (stand over toilet with RW) Toileting- Clothing Manipulation and Hygiene: Total assistance;Sit to/from  stand  Cognition: Cognition Overall Cognitive Status: Within Functional Limits for tasks assessed Orientation Level: Oriented X4 Cognition Arousal/Alertness: Awake/alert Behavior During Therapy: WFL for tasks assessed/performed Overall Cognitive Status: Within Functional Limits for tasks assessed  Physical Exam: Blood pressure 113/64, pulse 75, temperature 97.4 F (36.3 C), temperature source Oral, resp. rate 16, height '5\' 11"'  (1.803 m), weight 154.677 kg (341 lb), SpO2 93.00%. Physical Exam Constitutional: He is oriented to person, place, and time. He appears well-developed and well-nourished. Morbidly obese HENT: dentition poor, oral mucosa pink/moist Head: Normocephalic and atraumatic.  Eyes: EOM are normal. Pupils are equal, round, and reactive to light.  Neck: Normal range of motion. Neck supple.  Cardiovascular: Normal rate and regular rhythm.  No murmur heard.  Respiratory: Effort normal. He has decreased breath sounds in the right lower field and the left lower field.  GI: Soft. Normal appearance. He exhibits mild distension. Bowel sounds are decreased. There is generalized tenderness.  Musculoskeletal:  Right knee with foam dressing--wound well-approximated with minimal drainage. .  Neurological: He is alert and oriented to person, place, and time.  MMT: 5/5 bilateral deltoid, bicep, tricep, grip  2 minus right hip flexor, knee extensor not assessed secondary to dressing, 3 minus right ankle dorsiflexor plantar flexor  4+ left hip flexor knee extensor ankle dorsiflexor plantar flexor  Sensory equal intact bilateral feet Skin: incision intact, prior left TKA scar well-healed Psych: pleasant and cooperative  Results for orders placed during the hospital encounter of 05/15/14 (from the past 48 hour(s))  URINALYSIS, ROUTINE W REFLEX MICROSCOPIC     Status: Abnormal   Collection Time    05/15/14  5:08 PM      Result Value Ref Range   Color, Urine YELLOW  YELLOW    APPearance CLOUDY (*) CLEAR   Specific Gravity, Urine 1.023  1.005 - 1.030   pH 5.0  5.0 - 8.0   Glucose, UA NEGATIVE  NEGATIVE mg/dL   Hgb urine dipstick NEGATIVE  NEGATIVE   Bilirubin Urine NEGATIVE  NEGATIVE   Ketones, ur NEGATIVE  NEGATIVE mg/dL   Protein, ur 30 (*) NEGATIVE mg/dL   Urobilinogen, UA 0.2  0.0 - 1.0 mg/dL   Nitrite NEGATIVE  NEGATIVE   Leukocytes, UA NEGATIVE  NEGATIVE  URINE MICROSCOPIC-ADD ON     Status: Abnormal   Collection Time    05/15/14  5:08 PM      Result Value Ref Range   Squamous Epithelial / LPF RARE  RARE   WBC, UA 0-2  <3 WBC/hpf   RBC / HPF 0-2  <3 RBC/hpf   Bacteria, UA RARE  RARE   Casts GRANULAR CAST (*) NEGATIVE   Urine-Other AMORPHOUS URATES/PHOSPHATES    CBC     Status: Abnormal   Collection Time    05/15/14  5:10 PM      Result Value Ref Range   WBC 14.9 (*) 4.0 - 10.5 K/uL   RBC 4.61  4.22 - 5.81 MIL/uL   Hemoglobin 14.2  13.0 - 17.0 g/dL   HCT 43.1  39.0 - 52.0 %   MCV 93.5  78.0 - 100.0 fL   MCH 30.8  26.0 - 34.0 pg   MCHC 32.9  30.0 - 36.0 g/dL   RDW 13.9  11.5 - 15.5 %   Platelets 96 (*) 150 - 400 K/uL   Comment: PLATELET COUNT CONFIRMED BY SMEAR     REPEATED TO VERIFY     SPECIMEN CHECKED FOR CLOTS  CREATININE, SERUM     Status: Abnormal   Collection Time    05/15/14  5:10 PM      Result Value Ref Range   Creatinine, Ser 0.99  0.50 - 1.35 mg/dL   GFR calc non Af Amer 86 (*) >90 mL/min   GFR calc Af Amer >90  >90 mL/min   Comment: (NOTE)     The eGFR has been calculated using the CKD EPI equation.     This calculation has not been validated in all clinical situations.     eGFR's persistently <90 mL/min signify possible Chronic Kidney     Disease.  CBC     Status: Abnormal   Collection Time    05/16/14  5:08 AM      Result Value Ref Range   WBC 13.0 (*) 4.0 - 10.5 K/uL   RBC 4.21 (*) 4.22 - 5.81 MIL/uL   Hemoglobin 12.9 (*) 13.0 - 17.0 g/dL   HCT 39.8  39.0 - 52.0 %   MCV 94.5  78.0 - 100.0 fL   MCH 30.6  26.0 -  34.0 pg   MCHC 32.4  30.0 - 36.0 g/dL   RDW 14.0  11.5 - 15.5 %   Platelets 200  150 - 400 K/uL  Comment: REPEATED TO VERIFY     DELTA CHECK NOTED  BASIC METABOLIC PANEL     Status: Abnormal   Collection Time    05/16/14  5:08 AM      Result Value Ref Range   Sodium 142  137 - 147 mEq/L   Potassium 4.7  3.7 - 5.3 mEq/L   Chloride 104  96 - 112 mEq/L   CO2 29  19 - 32 mEq/L   Glucose, Bld 144 (*) 70 - 99 mg/dL   BUN 19  6 - 23 mg/dL   Creatinine, Ser 1.22  0.50 - 1.35 mg/dL   Calcium 8.3 (*) 8.4 - 10.5 mg/dL   GFR calc non Af Amer 62 (*) >90 mL/min   GFR calc Af Amer 72 (*) >90 mL/min   Comment: (NOTE)     The eGFR has been calculated using the CKD EPI equation.     This calculation has not been validated in all clinical situations.     eGFR's persistently <90 mL/min signify possible Chronic Kidney     Disease.   Anion gap 9  5 - 15  CBC     Status: Abnormal   Collection Time    05/17/14  6:00 AM      Result Value Ref Range   WBC 10.6 (*) 4.0 - 10.5 K/uL   RBC 3.79 (*) 4.22 - 5.81 MIL/uL   Hemoglobin 11.3 (*) 13.0 - 17.0 g/dL   HCT 35.6 (*) 39.0 - 52.0 %   MCV 93.9  78.0 - 100.0 fL   MCH 29.8  26.0 - 34.0 pg   MCHC 31.7  30.0 - 36.0 g/dL   RDW 14.2  11.5 - 15.5 %   Platelets 180  150 - 400 K/uL   Dg Knee Right Port  05/15/2014   CLINICAL DATA:  63 year old male status post knee arthroplasty. Initial encounter.  EXAM: PORTABLE RIGHT KNEE - 1-2 VIEW  COMPARISON:  Preoperative MRI 04/07/2014.  FINDINGS: Portable AP and cross-table lateral views at 1300 hrs. Sequelae of right total knee arthroplasty. Hardware appears intact and normally aligned. Postoperative drain in place. Postoperative changes to the surrounding soft tissues.  IMPRESSION: Right total knee arthroplasty, no adverse features identified.   Electronically Signed   By: Lars Pinks M.D.   On: 05/15/2014 13:09       Medical Problem List and Plan: 1. Functional deficits secondary to Endstage OA right Knee s/p  TKR 2.  DVT Prophylaxis/Anticoagulation: Pharmaceutical: Lovenox 3. Pain Management: Continue celebrex for now. Increase oxycontin 4. H/o depression with anxiety disorder/Mood: Continue Viibryd. Team to provide ego support. LCSW to follow for evaluation and support.  5. Neuropsych: This patient is capable of making decisions on his own behalf. 6. Skin/Wound Care: Monitor daily for stability, any signs of infection or breakdown. .  7.  Reactive leucocytosis: Resolving. No signs of infection noted. Will recheck on 07/27 8. ABLA: Monitor for stability. Will recheck labs on 07/27 9. HTN: Monitor every 8 hours. Continue  Lasix, lisinopril and coreg. Monitor renal status with addition of celebrex.  10. Post op ilues?: has minimal bowel sound and no BM so far. SSE today if no BM by supper. Aggressive bowel program for now to avoid ileus 9. Morbid obesity: Pressure relief measures. Bariatric AE.       Post Admission Physician Evaluation: 1. Functional deficits secondary  to endstage OA right knee s/p TKA. 2. Patient is admitted to receive collaborative, interdisciplinary care between the physiatrist, rehab  nursing staff, and therapy team. 3. Patient's level of medical complexity and substantial therapy needs in context of that medical necessity cannot be provided at a lesser intensity of care such as a SNF. 4. Patient has experienced substantial functional loss from his/her baseline which was documented above under the "Functional History" and "Functional Status" headings.  Judging by the patient's diagnosis, physical exam, and functional history, the patient has potential for functional progress which will result in measurable gains while on inpatient rehab.  These gains will be of substantial and practical use upon discharge  in facilitating mobility and self-care at the household level. 5. Physiatrist will provide 24 hour management of medical needs as well as oversight of the therapy plan/treatment  and provide guidance as appropriate regarding the interaction of the two. 6. 24 hour rehab nursing will assist with bladder management, bowel management, safety, skin/wound care, disease management, medication administration, pain management and patient education  and help integrate therapy concepts, techniques,education, etc. 7. PT will assess and treat for/with: Lower extremity strength, range of motion, stamina, balance, functional mobility, safety, adaptive techniques and equipment, Knee ROM, pain mgt, egosupport, community reintegration.   Goals are: mod I. 8. OT will assess and treat for/with: ADL's, functional mobility, safety, upper extremity strength, adaptive techniques and equipment, pain mgt, leisure awareness, egosupport.   Goals are: mod I. 9. SLP will assess and treat for/with: n/a.  Goals are: n/a. 10. Case Management and Social Worker will assess and treat for psychological issues and discharge planning. 11. Team conference will be held weekly to assess progress toward goals and to determine barriers to discharge. 12. Patient will receive at least 3 hours of therapy per day at least 5 days per week. 13. ELOS: 7 days       14. Prognosis:  excellent     Meredith Staggers, MD, [facility]   05/17/2014

## 2014-05-18 ENCOUNTER — Inpatient Hospital Stay (HOSPITAL_COMMUNITY): Payer: Medicare Other | Admitting: *Deleted

## 2014-05-18 ENCOUNTER — Inpatient Hospital Stay (HOSPITAL_COMMUNITY): Payer: Medicare Other | Admitting: Physical Therapy

## 2014-05-18 DIAGNOSIS — Z96659 Presence of unspecified artificial knee joint: Secondary | ICD-10-CM

## 2014-05-18 DIAGNOSIS — Z5189 Encounter for other specified aftercare: Secondary | ICD-10-CM

## 2014-05-18 DIAGNOSIS — M1710 Unilateral primary osteoarthritis, unspecified knee: Secondary | ICD-10-CM

## 2014-05-18 LAB — URINALYSIS, ROUTINE W REFLEX MICROSCOPIC
Bilirubin Urine: NEGATIVE
GLUCOSE, UA: NEGATIVE mg/dL
HGB URINE DIPSTICK: NEGATIVE
Ketones, ur: NEGATIVE mg/dL
Leukocytes, UA: NEGATIVE
Nitrite: NEGATIVE
Protein, ur: NEGATIVE mg/dL
SPECIFIC GRAVITY, URINE: 1.02 (ref 1.005–1.030)
Urobilinogen, UA: 1 mg/dL (ref 0.0–1.0)
pH: 5.5 (ref 5.0–8.0)

## 2014-05-18 MED ORDER — SORBITOL 70 % SOLN
960.0000 mL | TOPICAL_OIL | Freq: Once | ORAL | Status: AC
  Administered 2014-05-18: 960 mL via RECTAL
  Filled 2014-05-18: qty 240

## 2014-05-18 MED ORDER — SENNOSIDES-DOCUSATE SODIUM 8.6-50 MG PO TABS
2.0000 | ORAL_TABLET | Freq: Two times a day (BID) | ORAL | Status: DC
  Administered 2014-05-18 – 2014-05-23 (×7): 2 via ORAL
  Filled 2014-05-18 (×16): qty 2

## 2014-05-18 NOTE — Evaluation (Signed)
Occupational Therapy Assessment and Plan  Patient Details  Name: Jonathan Forbes MRN: [id number] Date of Birth: [birth year]  OT Diagnosis: acute pain, muscle weakness (generalized) and pain in joint Rehab Potential: Rehab Potential: Good ELOS: 10-14 days   Today's Date: 05/18/2014 Time: 1040-1140  1st session Time Calculation (min): 60 min  Problem List:  Patient Active Problem List   Diagnosis Date Noted  . 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 Osteoarthritis of right knee 05/15/2014  . OSA (obstructive sleep apnea) 04/22/2014  . HTN (hypertension) 12/14/2013  . Meralgia paraesthetica 12/14/2013  . Postoperative anemia due to acute blood loss 12/14/2013  . OA (osteoarthritis) of knee 12/11/2013  . Osteoarthritis of left knee 12/05/2013  . Total knee replacement status 12/05/2013    Past Medical History:  Past Medical History  Diagnosis Date  . Hypertension   . Depression   . Glaucoma     Hx; of beginning stages  . Eczema     Hx; of  . Anxiety   . Mitral valve regurgitation     Hx; of slight  . Heart murmur   . OSA (obstructive sleep apnea)     not using CPAP at present, will have a sleep study in August  . OA (osteoarthritis) of knee     "both" (05/16/2014)  . Skin cancer 08/2013    "back"   Past Surgical History:  Past Surgical History  Procedure Laterality Date  . Tumor excision      skin cancer of right side of back  . Cataract extraction w/ intraocular lens  implant, bilateral Bilateral   . Colonoscopy      Hx; of  . Tonsillectomy    . Total knee arthroplasty Left 12/05/2013    Procedure: LEFT TOTAL KNEE ARTHROPLASTY;  Surgeon: Marianna Payment, MD;  Location: [facility];  Service: Orthopedics;  Laterality: Left;  [facility] Kitchen Eye surgery    . Joint replacement    . Breath tek h pylori N/A 05/14/2014    Procedure: BREATH TEK H PYLORI;  Surgeon: Pedro Earls, MD;  Location: [facility];  Service: General;  Laterality: N/A;  . Total knee arthroplasty Right 05/15/2014   Procedure: RIGHT TOTAL KNEE ARTHROPLASTY;  Surgeon: Marianna Payment, MD;  Location: [facility];  Service: Orthopedics;  Laterality: Right;  . Skin cancer excision  08/2013    "off my back; not melanoma"    Assessment & Plan Clinical Impression: HPI: Jonathan Forbes is a 63 y.o. 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 He was admitted on 05/15/14 for R-TKR and is WBAT RLE. Post op with ABLA as well as reactive leucocytosis and problems with pain control. Therapy initiated and CIR recommended by Rehab as well as MD. Patient admitted today for comprehensive inpatient therapies.    Patient transferred to CIR on 05/17/2014 .    Patient currently requires mod with basic self-care skills secondary to muscle weakness and muscle joint tightness.  Prior to hospitalization, patient could complete BADL with independent .  Patient will benefit from skilled intervention to decrease level of assist with basic self-care skills and increase independence with basic self-care skills prior to discharge home independently.  Anticipate patient will require intermittent supervision and follow up home health.  OT - End of Session Activity Tolerance: Tolerates 10 - 20 min activity with multiple rests Endurance Deficit: Yes OT Assessment Rehab Potential: Good Barriers to Discharge: Decreased caregiver support OT Patient demonstrates  impairments in the following area(s): Balance;Endurance;Pain;Safety;Motor OT Basic ADL's Functional Problem(s): Grooming;Bathing;Dressing;Toileting OT Advanced ADL's Functional Problem(s): Simple Meal Preparation OT Transfers Functional Problem(s): Toilet;Tub/Shower OT Additional Impairment(s): None OT Plan OT Intensity: Minimum of 1-2 x/day, 45 to 90 minutes OT Frequency: 5 out of 7 days OT Duration/Estimated Length of Stay: 10-14 days OT Treatment/Interventions: Medical illustrator training;Community  reintegration;Discharge planning;DME/adaptive equipment instruction;Functional mobility training;Pain management;Patient/family education;Self Care/advanced ADL retraining;Therapeutic Activities;Therapeutic Exercise;UE/LE Strength taining/ROM;Wheelchair propulsion/positioning OT Self Feeding Anticipated Outcome(s): independent OT Basic Self-Care Anticipated Outcome(s): mod indenpent OT Toileting Anticipated Outcome(s): mod independent OT Bathroom Transfers Anticipated Outcome(s): mod independent OT Recommendation Patient destination: Home Follow Up Recommendations: Home health OT Equipment Recommended: 3 in 1 bedside comode (Need )   Skilled Therapeutic Intervention Time:  1445-1530  (45 min) Pain:  7/10 right knee Individual session:  Addressed wc mobility, tub and toilet transfers.  Unable to complete tub transfer due to decreased right knee flexion plus KI was on.  Pt. Did transfer to 3n1 commode chair with minimal assist for balance and sit to stand.  Went over POC  And Goals.       OT Evaluation Precautions/Restrictions  Precautions Required Braces or Orthoses: Knee Immobilizer - Right Knee Immobilizer - Right: Discontinue once straight leg raise with < 10 degree lag;On when out of bed or walking Restrictions Weight Bearing Restrictions: Yes RLE Weight Bearing: Weight bearing as tolerated Pain  7/10  Knee  1st session   Home Living/Prior Functioning Home Living Available Help at Discharge: Family;Friend(s);Available PRN/intermittently Type of Home: Apartment Home Access: Elevator Home Layout: One level  Lives With: Alone IADL History Homemaking Responsibilities: Yes Meal Prep Responsibility: Primary Laundry Responsibility: Primary Cleaning Responsibility: Primary Bill Paying/Finance Responsibility: Primary Current License: Yes Mode of Transportation: Car Leisure and Hobbies:  (read, tv) Prior Function Level of Independence: Independent with basic ADLs;Independent  with homemaking with ambulation;Independent with gait;Independent with transfers  Able to Take Stairs?: Yes Driving: Yes Vocation: On disability ADL   Vision/Perception  Vision- Assessment Eye Alignment: Within Functional Limits Perception Perception: Within Functional Limits Praxis Praxis: Intact  Cognition Overall Cognitive Status: Within Functional Limits for tasks assessed Orientation Level: Oriented X4 Attention: Sustained Sustained Attention: Appears intact Memory: Appears intact Awareness: Appears intact Problem Solving: Impaired Safety/Judgment: Appears intact Sensation Sensation Light Touch: Appears Intact Coordination Gross Motor Movements are Fluid and Coordinated: Yes Fine Motor Movements are Fluid and Coordinated: Yes Coordination and Movement Description: R knee partially isolated movements Motor  Motor Motor: Abnormal postural alignment and control Motor - Skilled Clinical Observations: Foward flexed posture, rounded shoulders, forward head Mobility  Bed Mobility Supine to Sit: 3: Mod assist Supine to Sit Details (indicate cue type and reason): with cues for weight shift and technique Transfers Sit to Stand: 3: Mod assist Sit to Stand Details: Manual facilitation for placement;Manual facilitation for weight bearing Stand to Sit: 3: Mod assist  Trunk/Postural Assessment  Cervical Assessment Cervical Assessment: Within Functional Limits Thoracic Assessment Thoracic Assessment: Within Functional Limits Lumbar Assessment Lumbar Assessment: Within Functional Limits  Balance Balance Balance Assessed: Yes Static Sitting Balance Static Sitting - Balance Support: Feet supported Static Sitting - Level of Assistance: 6: Modified independent (Device/Increase time) Static Sitting - Comment/# of Minutes: 5' Dynamic Sitting Balance Dynamic Sitting - Level of Assistance: 3: Mod assist (Pt. unable to reach feet for shoes or pants) Reach (Patient is able to reach  5_ inches to right, left, forward, back): reaching to mod excursion Dynamic Sitting - Balance Activities: Reaching for objects Static  Standing Balance Static Standing - Level of Assistance: 4: Min assist Static Standing - Comment/# of Minutes: 1'x3 Dynamic Standing Balance Dynamic Standing - Level of Assistance: 4: Min assist Dynamic Standing - Balance Activities: Reaching for objects Dynamic Standing - Comments: reaching to min excursion Extremity/Trunk Assessment RUE Assessment RUE Assessment: Within Functional Limits LUE Assessment LUE Assessment: Within Functional Limits  FIM:  FIM - Eating Eating Activity: 7: Complete independence:no helper FIM - Grooming Grooming Steps: Wash, rinse, dry face;Wash, rinse, dry hands Grooming: 4: Patient completes 3 of 4 or 4 of 5 steps FIM - Bathing Bathing Steps Patient Completed: Chest;Right Arm;Left Arm;Abdomen;Front perineal area;Right upper leg;Left upper leg Bathing: 3: Mod-Patient completes 5-7 [id number] parts or 50-74% FIM - Upper Body Dressing/Undressing Upper body dressing/undressing steps patient completed: Thread/unthread right sleeve of pullover shirt/dresss;Thread/unthread left sleeve of pullover shirt/dress;Put head through opening of pull over shirt/dress;Pull shirt over trunk Upper body dressing/undressing: 5: Set-up assist to: Obtain clothing/put away FIM - Lower Body Dressing/Undressing Lower body dressing/undressing: 2: Max-Patient completed 25-49% of tasks FIM - Toileting Toileting steps completed by patient: Performs perineal hygiene Toileting: 2: Max-Patient completed 1 of 3 steps FIM - Bed/Chair Transfer Bed/Chair Transfer: 3: Supine > Sit: Mod A (lifting assist/Pt. 50-74%/lift 2 legs;3: Bed > Chair or W/C: Mod A (lift or lower assist) FIM - TRadio producerDevices: Grab bars;Walker Toilet Transfers: 4-From toilet/BSC: Min A (steadying Pt. > 75%) FIM - Tub/Shower Transfers Tub/Shower Assistive  Devices: Tub transfer bench;Grab bars;Walker Tub/shower Transfers: 3-Into Tub/Shower: Mod A (lift or lower/lift 2 legs)   Refer to Care Plan for Long Term Goals  Recommendations for other services: None  Discharge Criteria: Patient will be discharged from OT if patient refuses treatment 3 consecutive times without medical reason, if treatment goals not met, if there is a change in medical status, if patient makes no progress towards goals or if patient is discharged from hospital.  The above assessment, treatment plan, treatment alternatives and goals were discussed and mutually agreed upon: by patient  ELisa Roca7/25/2015, 1:08 PM

## 2014-05-18 NOTE — Progress Notes (Signed)
Subjective/Complaints: 63 y.o. 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 He was admitted on 05/15/14 for R-TKR and is WBAT RLE. Post op with ABLA as well as reactive leucocytosis and problems with pain control.  Would like IV out Objective: Vital Signs: Blood pressure 148/86, pulse 76, temperature 98 F (36.7 C), temperature source Oral, resp. rate 19, weight 155 kg (341 lb 11.4 oz), SpO2 91.00%. No results found. Results for orders placed during the hospital encounter of 05/17/14 (from the past 72 hour(s))  CBC     Status: Abnormal   Collection Time    05/17/14  8:05 PM      Result Value Ref Range   WBC 11.3 (*) 4.0 - 10.5 K/uL   RBC 3.85 (*) 4.22 - 5.81 MIL/uL   Hemoglobin 11.8 (*) 13.0 - 17.0 g/dL   HCT 36.5 (*) 39.0 - 52.0 %   MCV 94.8  78.0 - 100.0 fL   MCH 30.6  26.0 - 34.0 pg   MCHC 32.3  30.0 - 36.0 g/dL   RDW 13.9  11.5 - 15.5 %   Platelets 194  150 - 400 K/uL  CREATININE, SERUM     Status: Abnormal   Collection Time    05/17/14  8:05 PM      Result Value Ref Range   Creatinine, Ser 1.24  0.50 - 1.35 mg/dL   GFR calc non Af Amer 61 (*) >90 mL/min   GFR calc Af Amer 70 (*) >90 mL/min   Comment: (NOTE)     The eGFR has been calculated using the CKD EPI equation.     This calculation has not been validated in all clinical situations.     eGFR's persistently <90 mL/min signify possible Chronic Kidney     Disease.  URINALYSIS, ROUTINE W REFLEX MICROSCOPIC     Status: None   Collection Time    05/18/14  3:14 AM      Result Value Ref Range   Color, Urine YELLOW  YELLOW   APPearance CLEAR  CLEAR   Specific Gravity, Urine 1.020  1.005 - 1.030   pH 5.5  5.0 - 8.0   Glucose, UA NEGATIVE  NEGATIVE mg/dL   Hgb urine dipstick NEGATIVE  NEGATIVE   Bilirubin Urine NEGATIVE  NEGATIVE   Ketones, ur NEGATIVE  NEGATIVE mg/dL   Protein, ur NEGATIVE  NEGATIVE mg/dL   Urobilinogen, UA 1.0  0.0 - 1.0 mg/dL   Nitrite  NEGATIVE  NEGATIVE   Leukocytes, UA NEGATIVE  NEGATIVE   Comment: MICROSCOPIC NOT DONE ON URINES WITH NEGATIVE PROTEIN, BLOOD, LEUKOCYTES, NITRITE, OR GLUCOSE <1000 mg/dL.     HEENT: normal Cardio: RRR Resp: CTA B/L and unlabored GI: BS positive, Distention and non tender Extremity:  Edema Right knee Skin:   Wound C/D/I and midline incison with dressing  Neuro: Alert/Oriented, Lethargic, Cranial Nerve Abnormalities Neg and Abnormal Motor 5/5 in BUE, 2- R HF trace KE, 4/5 ankle DF, LLE 5/5 Musc/Skel:  Extremity tender Right knee Gen NAD   Assessment/Plan: 1. Functional deficits secondary to Right TKR with morbid obesitiy which require 3+ hours per day of interdisciplinary therapy in a comprehensive inpatient rehab setting. Physiatrist is providing close team supervision and 24 hour management of active medical problems listed below. Physiatrist and rehab team continue to assess barriers to discharge/monitor patient progress toward functional and medical goals. FIM:       FIM - Toileting Toileting steps completed  by patient: Adjust clothing prior to toileting;Adjust clothing after toileting Toileting Assistive Devices: Grab bar or rail for support Toileting: 3: Mod-Patient completed 2 of 3 steps           Comprehension Comprehension Mode: Auditory Comprehension: 7-Follows complex conversation/direction: With no assist  Expression Expression Mode: Verbal Expression: 7-Expresses complex ideas: With no assist  Social Interaction Social Interaction: 7-Interacts appropriately with others - No medications needed.  Problem Solving Problem Solving: 7-Solves complex problems: Recognizes & self-corrects  Memory Memory: 7-Complete Independence: No helper  Medical Problem List and Plan:  1. Functional deficits secondary to Endstage OA right Knee s/p TKR  2. DVT Prophylaxis/Anticoagulation: Pharmaceutical: Lovenox  3. Pain Management: Continue celebrex for now. Increase  oxycontin  4. H/o depression with anxiety disorder/Mood: Continue Viibryd. Team to provide ego support. LCSW to follow for evaluation and support.  5. Neuropsych: This patient is capable of making decisions on his own behalf.  6. Skin/Wound Care: Monitor daily for stability, any signs of infection or breakdown. .  7. Reactive leucocytosis: Resolving. No signs of infection noted. Will recheck on 07/27  8. ABLA: Monitor for stability. Will recheck labs on 07/27  9. HTN: Monitor every 8 hours. Continue Lasix, lisinopril and coreg. Monitor renal status with addition of celebrex.  10. Post op ilues?: has minimal bowel sound and no BM so far. SSE today if no BM by supper. Aggressive bowel program for now to avoid ileus  9. Morbid obesity: Pressure relief measures. Bariatric AE.   LOS (Days) 1 A FACE TO FACE EVALUATION WAS PERFORMED  KIRSTEINS,ANDREW E 05/18/2014, 6:55 AM

## 2014-05-18 NOTE — Plan of Care (Signed)
Problem: RH BOWEL ELIMINATION Goal: RH STG MANAGE BOWEL WITH ASSISTANCE STG Manage Bowel with min Assistance.  Outcome: Not Progressing LBM 7/21 patient claims he's passing gas refused laxative this am but willing to do SMOG enema later today

## 2014-05-18 NOTE — Evaluation (Signed)
Physical Therapy Assessment and Plan  Patient Details  Name: Jonathan Forbes MRN: [id number] Date of Birth: 11/28/1950  PT Diagnosis: Abnormal posture, Difficulty walking, Impaired sensation, Muscle spasms, Muscle weakness, Osteoarthritis and Pain in joint Rehab Potential: Good ELOS: 2 weeks   Today's Date: 05/18/2014 Time: 0810-0910, 1301-1401 Time Calculation (min): 60 min, 60 min  Problem List:  Patient Active Problem List   Diagnosis Date Noted  . 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 Osteoarthritis of right knee 05/15/2014  . OSA (obstructive sleep apnea) 04/22/2014  . HTN (hypertension) 12/14/2013  . Meralgia paraesthetica 12/14/2013  . Postoperative anemia due to acute blood loss 12/14/2013  . OA (osteoarthritis) of knee 12/11/2013  . Osteoarthritis of left knee 12/05/2013  . Total knee replacement status 12/05/2013    Past Medical History:  Past Medical History  Diagnosis Date  . Hypertension   . Depression   . Glaucoma     Hx; of beginning stages  . Eczema     Hx; of  . Anxiety   . Mitral valve regurgitation     Hx; of slight  . Heart murmur   . OSA (obstructive sleep apnea)     not using CPAP at present, will have a sleep study in August  . OA (osteoarthritis) of knee     "both" (05/16/2014)  . Skin cancer 08/2013    "back"   Past Surgical History:  Past Surgical History  Procedure Laterality Date  . Tumor excision      skin cancer of right side of back  . Cataract extraction w/ intraocular lens  implant, bilateral Bilateral   . Colonoscopy      Hx; of  . Tonsillectomy    . Total knee arthroplasty Left 12/05/2013    Procedure: LEFT TOTAL KNEE ARTHROPLASTY;  Surgeon: Marianna Payment, MD;  Location: [place];  Service: Orthopedics;  Laterality: Left;  [facility] Eye surgery    . Joint replacement    . Breath tek h pylori N/A 05/14/2014    Procedure: BREATH TEK H PYLORI;  Surgeon: Pedro Earls, MD;  Location: [facility];  Service: General;  Laterality: N/A;  .  Total knee arthroplasty Right 05/15/2014    Procedure: RIGHT TOTAL KNEE ARTHROPLASTY;  Surgeon: Marianna Payment, MD;  Location: [facility];  Service: Orthopedics;  Laterality: Right;  . Skin cancer excision  08/2013    "off my back; not melanoma"    Assessment & Plan Clinical Impression: Patient is a 63 y.o. year old male with recent admission to the hospital on 05/15/14 s/p R TKA.  Patient transferred to CIR on 05/17/2014 .   As per MEDICAL RECORD NUMBER"Jonathan Forbes is a 63 y.o. 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 He was admitted on 05/15/14 for R-TKR and is WBAT RLE. Post op with ABLA as well as reactive leucocytosis and problems with pain control. Therapy initiated and CIR recommended by Rehab as well as MD."      Patient currently requires mod with mobility secondary to muscle weakness and muscle joint tightness and motor control.  Prior to hospitalization, patient was independent  with mobility and lived with Alone in a [place] home.  Home access is  Elevator.  Patient will benefit from skilled PT intervention to maximize safe functional mobility for planned discharge home alone.  Anticipate patient will benefit from follow up [place] at discharge.  PT - End of Session Endurance Deficit:  Yes PT Assessment Rehab Potential: Good Barriers to Discharge: Decreased caregiver support PT Patient demonstrates impairments in the following area(s): Balance;Endurance;Motor;Pain;Sensory;Skin Integrity;Edema PT Transfers Functional Problem(s): Bed Mobility;Bed to Chair;Car;Furniture;Floor PT Locomotion Functional Problem(s): Ambulation;Wheelchair Mobility;Stairs PT Plan PT Intensity: Minimum of 1-2 x/day ,45 to 90 minutes PT Frequency: 5 out of 7 days PT Treatment/Interventions: Ambulation/gait training;Community reintegration;Balance/vestibular training;Discharge planning;DME/adaptive equipment instruction;Functional electrical  stimulation;Functional mobility training;Patient/family education;Pain management;Neuromuscular re-education;Stair training;Therapeutic Activities;Therapeutic Exercise;UE/LE Strength taining/ROM;Skin care/wound management;Disease management/prevention PT Transfers Anticipated Outcome(s): Mod I PT Locomotion Anticipated Outcome(s): Mod I PT Recommendation Follow Up Recommendations: Home health PT Patient destination: Home Equipment Recommended: 3 in 1 bedside comode;Rolling walker with 5" wheels  Skilled Therapeutic Intervention  Tx 1: Pt performs repeated transfer training 5x2. Pt performs quad sets, hip abd AROM, ankle pumps, seated knee flexion AAROM 2x10. Pt static and dynamic sitting and standing balance activities with dual motor task including weight shifting, reaching, grasping, and LE manipulation. Pt educated on rehab plan and role OOB in progression.  Tx 2: Pt performs step-ups x4 with cues for safety and posture. Pt repeated knee flexion 2x10, LAQs 2x10. Pt educated on need to progress knee ROM. Gastroc stretch performed 2x30". Tibiofemoral mobs, patellar mobs, quadriceps release all performed. PT Evaluation Precautions/Restrictions Precautions Precautions: Fall Required Braces or Orthoses: Knee Immobilizer - Right Knee Immobilizer - Right: Discontinue once straight leg raise with < 10 degree lag;On when out of bed or walking Restrictions Weight Bearing Restrictions: Yes RLE Weight Bearing: Weight bearing as tolerated General Chart Reviewed: Yes Vital SignsTherapy Vitals Temp: 98.1 F (36.7 C) Temp src: Oral Pulse Rate: 80 Resp: 20 BP: 143/83 mmHg Patient Position (if appropriate): Sitting Oxygen Therapy SpO2: 91 % O2 Device: None (Room air) Pain   Home Living/Prior Functioning Home Living Available Help at Discharge: Family;Friend(s);Available PRN/intermittently Type of Home: Apartment Home Access: Elevator Home Layout: One level Additional Comments: Pt.reports  he had a VAC for a surgical wound on his back about 3 months ago, surgical wound from skin cancer removal  Lives With: Alone Prior Function Level of Independence: Independent with basic ADLs;Independent with homemaking with ambulation;Independent with gait;Independent with transfers  Able to Take Stairs?: Yes Driving: Yes Vocation: On disability Cognition Arousal/Alertness: Awake/alert Orientation Level: Oriented X4 Focused Attention: Appears intact Safety/Judgment: Appears intact Sensation Sensation Light Touch: Appears Intact Coordination Gross Motor Movements are Fluid and Coordinated: No Coordination and Movement Description: R knee partially isolated movements Motor  Motor Motor: Abnormal postural alignment and control Motor - Skilled Clinical Observations: Foward flexed posture, rounded shoulders, forward head  Mobility Bed Mobility Supine to Sit: 3: Mod assist Transfers Transfers: Yes Sit to Stand: 3: Mod assist Stand to Sit: 3: Mod assist Stand Pivot Transfers: 3: Mod assist Locomotion  Ambulation Ambulation/Gait Assistance: 4: Min assist  Balance Balance Balance Assessed: Yes Static Sitting Balance Static Sitting - Balance Support: Feet supported Static Sitting - Level of Assistance: 5: Stand by assistance Dynamic Sitting Balance Dynamic Sitting - Level of Assistance: 4: Min assist Reach (Patient is able to reach ___ inches to right, left, forward, back): reaching to mod excursion Dynamic Sitting - Balance Activities: Reaching for objects Static Standing Balance Static Standing - Level of Assistance: 4: Min assist Dynamic Standing Balance Dynamic Standing - Level of Assistance: 4: Min assist Dynamic Standing - Balance Activities: Reaching for objects Extremity Assessment  RUE Assessment RUE Assessment: Within Functional Limits LUE Assessment LUE Assessment: Within Functional Limits RLE Assessment RLE Assessment: Exceptions to [facility] RLE AROM  (degrees) Right  Hip Flexion: 95 Right Knee Extension: 5 Right Knee Flexion: 90 RLE Strength RLE Overall Strength Comments: grossly 3-/5 except R ankle 4/5 LLE Assessment LLE Assessment: Exceptions to WFL (AROM WFL grossly, MMT grossly 4/5)  FIM:  FIM - Bed/Chair Transfer Bed/Chair Transfer: 3: Supine > Sit: Mod A (lifting assist/Pt. 50-74%/lift 2 legs;3: Bed > Chair or W/C: Mod A (lift or lower assist) FIM - Locomotion: Wheelchair Locomotion: Wheelchair: 4: Travels 150 ft or more: maneuvers on rugs and over door sillls with minimal assistance (Pt.>75%) FIM - Locomotion: Ambulation Locomotion: Ambulation Assistive Devices: Administrator Ambulation/Gait Assistance: 4: Min assist Locomotion: Ambulation: 1: Travels less than 50 ft with minimal assistance (Pt.>75%) 40'x1 in am, 40'x 1 in pm with cues for pacing, posture, and attenion. FIM - Locomotion: Stairs Locomotion: Scientist, physiological: Insurance account manager - 2 Locomotion: Stairs: 1: Up and Down < 4 stairs with moderate assistance (Pt: 50 - 74%)   Refer to Care Plan for Long Term Goals  Recommendations for other services: None  Discharge Criteria: Patient will be discharged from PT if patient refuses treatment 3 consecutive times without medical reason, if treatment goals not met, if there is a change in medical status, if patient makes no progress towards goals or if patient is discharged from hospital.  The above assessment, treatment plan, treatment alternatives and goals were discussed and mutually agreed upon: by patient  Monia Pouch 05/18/2014, 4:06 PM

## 2014-05-19 ENCOUNTER — Inpatient Hospital Stay (HOSPITAL_COMMUNITY): Payer: Medicare Other | Admitting: Occupational Therapy

## 2014-05-19 LAB — URINE CULTURE
COLONY COUNT: NO GROWTH
Culture: NO GROWTH

## 2014-05-19 MED ORDER — GRX ANALGESIC BALM EX OINT
2.0000 g | TOPICAL_OINTMENT | Freq: Two times a day (BID) | CUTANEOUS | Status: DC
  Filled 2014-05-19: qty 28

## 2014-05-19 NOTE — Progress Notes (Signed)
Subjective/Complaints: 63 y.o. 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 He was admitted on 05/15/14 for R-TKR and is WBAT RLE. Post op with ABLA as well as reactive leucocytosis and problems with pain control.  Strained low back , mainly R sided, no radiating pain Objective: Vital Signs: Blood pressure 130/83, pulse 71, temperature 98 F (36.7 C), temperature source Oral, resp. rate 8, weight 155 kg (341 lb 11.4 oz), SpO2 90.00%. No results found. Results for orders placed during the hospital encounter of 05/17/14 (from the past 72 hour(s))  CBC     Status: Abnormal   Collection Time    05/17/14  8:05 PM      Result Value Ref Range   WBC 11.3 (*) 4.0 - 10.5 K/uL   RBC 3.85 (*) 4.22 - 5.81 MIL/uL   Hemoglobin 11.8 (*) 13.0 - 17.0 g/dL   HCT 36.5 (*) 39.0 - 52.0 %   MCV 94.8  78.0 - 100.0 fL   MCH 30.6  26.0 - 34.0 pg   MCHC 32.3  30.0 - 36.0 g/dL   RDW 13.9  11.5 - 15.5 %   Platelets 194  150 - 400 K/uL  CREATININE, SERUM     Status: Abnormal   Collection Time    05/17/14  8:05 PM      Result Value Ref Range   Creatinine, Ser 1.24  0.50 - 1.35 mg/dL   GFR calc non Af Amer 61 (*) >90 mL/min   GFR calc Af Amer 70 (*) >90 mL/min   Comment: (NOTE)     The eGFR has been calculated using the CKD EPI equation.     This calculation has not been validated in all clinical situations.     eGFR's persistently <90 mL/min signify possible Chronic Kidney     Disease.  URINALYSIS, ROUTINE W REFLEX MICROSCOPIC     Status: None   Collection Time    05/18/14  3:14 AM      Result Value Ref Range   Color, Urine YELLOW  YELLOW   APPearance CLEAR  CLEAR   Specific Gravity, Urine 1.020  1.005 - 1.030   pH 5.5  5.0 - 8.0   Glucose, UA NEGATIVE  NEGATIVE mg/dL   Hgb urine dipstick NEGATIVE  NEGATIVE   Bilirubin Urine NEGATIVE  NEGATIVE   Ketones, ur NEGATIVE  NEGATIVE mg/dL   Protein, ur NEGATIVE  NEGATIVE mg/dL   Urobilinogen,  UA 1.0  0.0 - 1.0 mg/dL   Nitrite NEGATIVE  NEGATIVE   Leukocytes, UA NEGATIVE  NEGATIVE   Comment: MICROSCOPIC NOT DONE ON URINES WITH NEGATIVE PROTEIN, BLOOD, LEUKOCYTES, NITRITE, OR GLUCOSE <1000 mg/dL.     HEENT: normal Cardio: RRR Resp: CTA B/L and unlabored GI: BS positive, Distention and non tender Extremity:  Edema Right knee Skin:   Wound C/D/I and midline incison with dressing  Neuro: Alert/Oriented, Lethargic, Cranial Nerve Abnormalities Neg and Abnormal Motor 5/5 in BUE, 2- R HF trace KE, 4/5 ankle DF, LLE 5/5 Musc/Skel:  Extremity tender Right knee Gen NAD   Assessment/Plan: 1. Functional deficits secondary to Right TKR with morbid obesitiy which require 3+ hours per day of interdisciplinary therapy in a comprehensive inpatient rehab setting. Physiatrist is providing close team supervision and 24 hour management of active medical problems listed below. Physiatrist and rehab team continue to assess barriers to discharge/monitor patient progress toward functional and medical goals. FIM: FIM - Bathing Bathing Steps Patient  Completed: Chest;Right Arm;Left Arm;Abdomen;Front perineal area;Right upper leg;Left upper leg Bathing: 3: Mod-Patient completes 5-7 60f10 parts or 50-74%  FIM - Upper Body Dressing/Undressing Upper body dressing/undressing steps patient completed: Thread/unthread right sleeve of pullover shirt/dresss;Thread/unthread left sleeve of pullover shirt/dress;Put head through opening of pull over shirt/dress;Pull shirt over trunk Upper body dressing/undressing: 5: Set-up assist to: Obtain clothing/put away FIM - Lower Body Dressing/Undressing Lower body dressing/undressing: 2: Max-Patient completed 25-49% of tasks  FIM - Toileting Toileting steps completed by patient: Adjust clothing prior to toileting;Adjust clothing after toileting Toileting Assistive Devices: Grab bar or rail for support Toileting: 3: Mod-Patient completed 2 of 3 steps  FIM - TGlass blower/designerDevices: Grab bars;Walker Toilet Transfers: 3-From toilet/BSC: Mod A (lift or lower assist)  FIM - BIT sales professionalTransfer: 3: Supine > Sit: Mod A (lifting assist/Pt. 50-74%/lift 2 legs;3: Bed > Chair or W/C: Mod A (lift or lower assist)  FIM - Locomotion: Wheelchair Distance: 150 Locomotion: Wheelchair: 4: Travels 150 ft or more: maneuvers on rugs and over door sillls with minimal assistance (Pt.>75%) FIM - Locomotion: Ambulation Locomotion: Ambulation Assistive Devices: WAdministratorAmbulation/Gait Assistance: 4: Min assist Locomotion: Ambulation: 1: Travels less than 50 ft with minimal assistance (Pt.>75%)  Comprehension Comprehension Mode: Auditory Comprehension: 7-Follows complex conversation/direction: With no assist  Expression Expression Mode: Verbal Expression: 7-Expresses complex ideas: With no assist  Social Interaction Social Interaction: 7-Interacts appropriately with others - No medications needed.  Problem Solving Problem Solving: 7-Solves complex problems: Recognizes & self-corrects  Memory Memory: 7-Complete Independence: No helper  Medical Problem List and Plan:  1. Functional deficits secondary to Endstage OA right Knee s/p TKR  2. DVT Prophylaxis/Anticoagulation: Pharmaceutical: Lovenox  3. Pain Management: Continue celebrex for now. Increase oxycontin  4. H/o depression with anxiety disorder/Mood: Continue Viibryd. Team to provide ego support. LCSW to follow for evaluation and support.  5. Neuropsych: This patient is capable of making decisions on his own behalf.  6. Skin/Wound Care: Monitor daily for stability, any signs of infection or breakdown. .  7. Reactive leucocytosis: Resolving. No signs of infection noted. Will recheck on 07/27  8. ABLA: Monitor for stability. Will recheck labs on 07/27  9. HTN: Monitor every 8 hours. Continue Lasix, lisinopril and coreg. Monitor renal status with addition  of celebrex.  10.Constipation improved after enema 11. Morbid obesity: Pressure relief measures. Bariatric AE. 12.  Low back strain, will start sports creme   LOS (Days) 2 A FACE TO FACE EVALUATION WAS PERFORMED  KIRSTEINS,ANDREW E 05/19/2014, 7:39 AM

## 2014-05-19 NOTE — Progress Notes (Signed)
Occupational Therapy Session Note  Patient Details  Name: Jonathan Forbes MRN: [id number] Date of Birth: [birth year]  Today's Date: 05/19/2014 Time: 1110-1210 Time Calculation (min): 60 min  Skilled Therapeutic Interventions/Progress Updates: [name] preferred to bathe EOB today.  He stated, "I am doing pretty good, but lately  I have this sharp pain in my right knee that come and goes and makes me scared to move because I never know when it might strike.  I have not had it today, but I am afraid to do anything that might make it come back."   He was able to complete sit to stand walker level in spite of guarding his right LE.  He was short of breath and took short rest breaks when needed.   He stated he was tired and worried about the pain that might come back into his right knee and for that reason did not want to wash his feet today.   Patient will likely benefit from sponge and possibly AE to dress feet due to deconditioning and body girth - not sure if he can reach his feet to put on socks.    Therapy Documentation Precautions:  Precautions Precautions: Fall Required Braces or Orthoses: Knee Immobilizer - Right Knee Immobilizer - Right: Discontinue once straight leg raise with < 10 degree lag;On when out of bed or walking Restrictions Weight Bearing Restrictions: Yes RLE Weight Bearing: Weight bearing as tolerated  Pain:denied     See FIM for current functional status  Therapy/Group: Individual Therapy  Alfredia Ferguson [facility] 05/19/2014, 4:09 PM

## 2014-05-20 ENCOUNTER — Inpatient Hospital Stay (HOSPITAL_COMMUNITY): Payer: Medicare Other

## 2014-05-20 LAB — CBC WITH DIFFERENTIAL/PLATELET
Basophils Absolute: 0 10*3/uL (ref 0.0–0.1)
Basophils Relative: 1 % (ref 0–1)
EOS ABS: 0.4 10*3/uL (ref 0.0–0.7)
EOS PCT: 4 % (ref 0–5)
HCT: 34.3 % — ABNORMAL LOW (ref 39.0–52.0)
Hemoglobin: 11.1 g/dL — ABNORMAL LOW (ref 13.0–17.0)
Lymphocytes Relative: 21 % (ref 12–46)
Lymphs Abs: 1.8 10*3/uL (ref 0.7–4.0)
MCH: 30.2 pg (ref 26.0–34.0)
MCHC: 32.4 g/dL (ref 30.0–36.0)
MCV: 93.2 fL (ref 78.0–100.0)
Monocytes Absolute: 1.1 10*3/uL — ABNORMAL HIGH (ref 0.1–1.0)
Monocytes Relative: 12 % (ref 3–12)
Neutro Abs: 5.4 10*3/uL (ref 1.7–7.7)
Neutrophils Relative %: 62 % (ref 43–77)
PLATELETS: 236 10*3/uL (ref 150–400)
RBC: 3.68 MIL/uL — ABNORMAL LOW (ref 4.22–5.81)
RDW: 13.9 % (ref 11.5–15.5)
WBC: 8.7 10*3/uL (ref 4.0–10.5)

## 2014-05-20 LAB — COMPREHENSIVE METABOLIC PANEL
ALT: 34 U/L (ref 0–53)
ANION GAP: 9 (ref 5–15)
AST: 22 U/L (ref 0–37)
Albumin: 2.9 g/dL — ABNORMAL LOW (ref 3.5–5.2)
Alkaline Phosphatase: 88 U/L (ref 39–117)
BUN: 14 mg/dL (ref 6–23)
CALCIUM: 8.8 mg/dL (ref 8.4–10.5)
CO2: 32 mEq/L (ref 19–32)
Chloride: 104 mEq/L (ref 96–112)
Creatinine, Ser: 0.82 mg/dL (ref 0.50–1.35)
GFR calc non Af Amer: >90 mL/min (ref >90)
GLUCOSE: 111 mg/dL — AB (ref 70–99)
Potassium: 3.7 mEq/L (ref 3.7–5.3)
SODIUM: 145 meq/L (ref 137–147)
TOTAL PROTEIN: 5.9 g/dL — AB (ref 6.0–8.3)
Total Bilirubin: 0.5 mg/dL (ref 0.3–1.2)

## 2014-05-20 NOTE — Progress Notes (Signed)
Subjective/Complaints: 63 y.o. 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 He was admitted on 05/15/14 for R-TKR and is WBAT RLE. Post op with ABLA as well as reactive leucocytosis and problems with pain control.     Pain overall improving No new issues Objective: Vital Signs: Blood pressure 167/92, pulse 74, temperature 97.9 F (36.6 C), temperature source Oral, resp. rate 17, weight 155 kg (341 lb 11.4 oz), SpO2 91.00%. No results found. Results for orders placed during the hospital encounter of 05/17/14 (from the past 72 hour(s))  CBC     Status: Abnormal   Collection Time    05/17/14  8:05 PM      Result Value Ref Range   WBC 11.3 (*) 4.0 - 10.5 K/uL   RBC 3.85 (*) 4.22 - 5.81 MIL/uL   Hemoglobin 11.8 (*) 13.0 - 17.0 g/dL   HCT 36.5 (*) 39.0 - 52.0 %   MCV 94.8  78.0 - 100.0 fL   MCH 30.6  26.0 - 34.0 pg   MCHC 32.3  30.0 - 36.0 g/dL   RDW 13.9  11.5 - 15.5 %   Platelets 194  150 - 400 K/uL  CREATININE, SERUM     Status: Abnormal   Collection Time    05/17/14  8:05 PM      Result Value Ref Range   Creatinine, Ser 1.24  0.50 - 1.35 mg/dL   GFR calc non Af Amer 61 (*) >90 mL/min   GFR calc Af Amer 70 (*) >90 mL/min   Comment: (NOTE)     The eGFR has been calculated using the CKD EPI equation.     This calculation has not been validated in all clinical situations.     eGFR's persistently <90 mL/min signify possible Chronic Kidney     Disease.  URINALYSIS, ROUTINE W REFLEX MICROSCOPIC     Status: None   Collection Time    05/18/14  3:14 AM      Result Value Ref Range   Color, Urine YELLOW  YELLOW   APPearance CLEAR  CLEAR   Specific Gravity, Urine 1.020  1.005 - 1.030   pH 5.5  5.0 - 8.0   Glucose, UA NEGATIVE  NEGATIVE mg/dL   Hgb urine dipstick NEGATIVE  NEGATIVE   Bilirubin Urine NEGATIVE  NEGATIVE   Ketones, ur NEGATIVE  NEGATIVE mg/dL   Protein, ur NEGATIVE  NEGATIVE mg/dL   Urobilinogen, UA 1.0   0.0 - 1.0 mg/dL   Nitrite NEGATIVE  NEGATIVE   Leukocytes, UA NEGATIVE  NEGATIVE   Comment: MICROSCOPIC NOT DONE ON URINES WITH NEGATIVE PROTEIN, BLOOD, LEUKOCYTES, NITRITE, OR GLUCOSE <1000 mg/dL.  URINE CULTURE     Status: None   Collection Time    05/18/14  3:15 AM      Result Value Ref Range   Specimen Description URINE, CLEAN CATCH     Special Requests NONE     Culture  Setup Time       Value: 05/18/2014 15:50     Performed at [facility] Count       Value: NO GROWTH     Performed at [facility]   Culture       Value: NO GROWTH     Performed at [facility]   Report Status 05/19/2014 FINAL    CBC WITH DIFFERENTIAL     Status: Abnormal   Collection Time  05/20/14  5:20 AM      Result Value Ref Range   WBC 8.7  4.0 - 10.5 K/uL   RBC 3.68 (*) 4.22 - 5.81 MIL/uL   Hemoglobin 11.1 (*) 13.0 - 17.0 g/dL   HCT 34.3 (*) 39.0 - 52.0 %   MCV 93.2  78.0 - 100.0 fL   MCH 30.2  26.0 - 34.0 pg   MCHC 32.4  30.0 - 36.0 g/dL   RDW 13.9  11.5 - 15.5 %   Platelets 236  150 - 400 K/uL   Neutrophils Relative % 62  43 - 77 %   Neutro Abs 5.4  1.7 - 7.7 K/uL   Lymphocytes Relative 21  12 - 46 %   Lymphs Abs 1.8  0.7 - 4.0 K/uL   Monocytes Relative 12  3 - 12 %   Monocytes Absolute 1.1 (*) 0.1 - 1.0 K/uL   Eosinophils Relative 4  0 - 5 %   Eosinophils Absolute 0.4  0.0 - 0.7 K/uL   Basophils Relative 1  0 - 1 %   Basophils Absolute 0.0  0.0 - 0.1 K/uL  COMPREHENSIVE METABOLIC PANEL     Status: Abnormal   Collection Time    05/20/14  5:20 AM      Result Value Ref Range   Sodium 145  137 - 147 mEq/L   Potassium 3.7  3.7 - 5.3 mEq/L   Chloride 104  96 - 112 mEq/L   CO2 32  19 - 32 mEq/L   Glucose, Bld 111 (*) 70 - 99 mg/dL   BUN 14  6 - 23 mg/dL   Creatinine, Ser 0.82  0.50 - 1.35 mg/dL   Calcium 8.8  8.4 - 10.5 mg/dL   Total Protein 5.9 (*) 6.0 - 8.3 g/dL   Albumin 2.9 (*) 3.5 - 5.2 g/dL   AST 22  0 - 37 U/L   ALT 34  0 - 53 U/L   Alkaline  Phosphatase 88  39 - 117 U/L   Total Bilirubin 0.5  0.3 - 1.2 mg/dL   GFR calc non Af Amer >90  >90 mL/min   GFR calc Af Amer >90  >90 mL/min   Comment: (NOTE)     The eGFR has been calculated using the CKD EPI equation.     This calculation has not been validated in all clinical situations.     eGFR's persistently <90 mL/min signify possible Chronic Kidney     Disease.   Anion gap 9  5 - 15     HEENT: normal Cardio: RRR Resp: CTA B/L and unlabored GI: BS positive, Distention and non tender Extremity:  Edema Right knee Skin:   Wound C/D/I and midline incison with dressing  Neuro: Alert/Oriented, Lethargic, Cranial Nerve Abnormalities Neg and Abnormal Motor 5/5 in BUE, 2- R HF trace KE, 4/5 ankle DF, LLE 5/5 Musc/Skel:  Incision with mild erythema, no drainage Gen NAD   Assessment/Plan: 1. Functional deficits secondary to Right TKR with morbid obesitiy which require 3+ hours per day of interdisciplinary therapy in a comprehensive inpatient rehab setting. Physiatrist is providing close team supervision and 24 hour management of active medical problems listed below. Physiatrist and rehab team continue to assess barriers to discharge/monitor patient progress toward functional and medical goals. FIM: FIM - Bathing Bathing Steps Patient Completed: Chest;Right Arm;Left Arm;Abdomen;Front perineal area;Buttocks;Right upper leg;Left upper leg Bathing: 4: Min-Patient completes 8-9 30f10 parts or 75+ percent  FIM - Upper Body Dressing/Undressing  Upper body dressing/undressing steps patient completed: Thread/unthread right sleeve of pullover shirt/dresss;Pull shirt over trunk;Thread/unthread left sleeve of pullover shirt/dress;Put head through opening of pull over shirt/dress Upper body dressing/undressing: 5: Supervision: Safety issues/verbal cues FIM - Lower Body Dressing/Undressing Lower body dressing/undressing steps patient completed:  (did not take off his shorts - only lowered them to  wash and pulled them back up) Lower body dressing/undressing: 0: Activity did not occur  FIM - Toileting Toileting steps completed by patient: Adjust clothing prior to toileting;Performs perineal hygiene;Adjust clothing after toileting Toileting Assistive Devices: Grab bar or rail for support Toileting: 6: More than reasonable amount of time  FIM - Radio producer Devices: Insurance account manager Transfers: 5-To toilet/BSC: Supervision (verbal cues/safety issues);5-From toilet/BSC: Supervision (verbal cues/safety issues)  FIM - Control and instrumentation engineer Devices: Copy: 6: Assistive device: no helper  FIM - Locomotion: Wheelchair Distance: 150 Locomotion: Wheelchair: 4: Travels 150 ft or more: maneuvers on rugs and over door sillls with minimal assistance (Pt.>75%) FIM - Locomotion: Ambulation Locomotion: Ambulation Assistive Devices: Administrator Ambulation/Gait Assistance: 4: Min assist Locomotion: Ambulation: 1: Travels less than 50 ft with minimal assistance (Pt.>75%)  Comprehension Comprehension Mode: Auditory Comprehension: 7-Follows complex conversation/direction: With no assist  Expression Expression Mode: Verbal Expression: 7-Expresses complex ideas: With no assist  Social Interaction Social Interaction: 7-Interacts appropriately with others - No medications needed.  Problem Solving Problem Solving: 7-Solves complex problems: Recognizes & self-corrects  Memory Memory: 7-Complete Independence: No helper  Medical Problem List and Plan:  1. Functional deficits secondary to Endstage OA right Knee s/p TKR  2. DVT Prophylaxis/Anticoagulation: Pharmaceutical: Lovenox  3. Pain Management: Continue celebrex for now. Increase oxycontin  4. H/o depression with anxiety disorder/Mood: Continue Viibryd. Team to provide ego support. LCSW to follow for evaluation and support.  5. Neuropsych: This patient is capable  of making decisions on his own behalf.  6. Skin/Wound Care: Monitor daily for stability, any signs of infection or breakdown. .  7. Reactive leucocytosis: Resolving. No signs of infection noted. Will recheck on 07/27  8. ABLA: Monitor for stability. Will recheck labs on 07/27  9. HTN: Monitor every 8 hours. Continue Lasix, lisinopril and coreg. Monitor renal status with addition of celebrex. Stable thus far 10.Constipation improved after enema 11. Morbid obesity: Pressure relief measures. Bariatric AE. 12.  Low back strain, will start sports creme   LOS (Days) 3 A FACE TO FACE EVALUATION WAS PERFORMED  Jonathan Forbes 05/20/2014, 6:36 AM

## 2014-05-20 NOTE — Plan of Care (Signed)
Problem: RH BOWEL ELIMINATION Goal: RH STG MANAGE BOWEL WITH ASSISTANCE STG Manage Bowel with Assistance. Mod I Outcome: Progressing No incontinent episode reported     

## 2014-05-20 NOTE — Progress Notes (Signed)
Social Work  Social Work Assessment and Plan  Patient Details  Name: Jonathan Forbes MRN: [id number] Date of Birth: 1951/05/24  Today's Date: 05/20/2014  Problem List:  Patient Active Problem List   Diagnosis Date Noted  . 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 Osteoarthritis of right knee 05/15/2014  . OSA (obstructive sleep apnea) 04/22/2014  . HTN (hypertension) 12/14/2013  . Meralgia paraesthetica 12/14/2013  . Postoperative anemia due to acute blood loss 12/14/2013  . OA (osteoarthritis) of knee 12/11/2013  . Osteoarthritis of left knee 12/05/2013  . Total knee replacement status 12/05/2013   Past Medical History:  Past Medical History  Diagnosis Date  . Hypertension   . Depression   . Glaucoma     Hx; of beginning stages  . Eczema     Hx; of  . Anxiety   . Mitral valve regurgitation     Hx; of slight  . Heart murmur   . OSA (obstructive sleep apnea)     not using CPAP at present, will have a sleep study in August  . OA (osteoarthritis) of knee     "both" (05/16/2014)  . Skin cancer 08/2013    "back"   Past Surgical History:  Past Surgical History  Procedure Laterality Date  . Tumor excision      skin cancer of right side of back  . Cataract extraction w/ intraocular lens  implant, bilateral Bilateral   . Colonoscopy      Hx; of  . Tonsillectomy    . Total knee arthroplasty Left 12/05/2013    Procedure: LEFT TOTAL KNEE ARTHROPLASTY;  Surgeon: Marianna Payment, MD;  Location: [facility];  Service: Orthopedics;  Laterality: Left;  Marland Kitchen Eye surgery    . Joint replacement    . Breath tek h pylori N/A 05/14/2014    Procedure: BREATH TEK H PYLORI;  Surgeon: Pedro Earls, MD;  Location: [facility];  Service: General;  Laterality: N/A;  . Total knee arthroplasty Right 05/15/2014    Procedure: RIGHT TOTAL KNEE ARTHROPLASTY;  Surgeon: Marianna Payment, MD;  Location: [facility];  Service: Orthopedics;  Laterality: Right;  . Skin cancer excision  08/2013    "off my back; not  melanoma"   Social History:  reports that he has never smoked. He has never used smokeless tobacco. He reports that he drinks about 3.6 ounces of alcohol per week. He reports that he does not use illicit drugs.  Family / Support Systems Marital Status: Divorced How Long?: "long long time ago" Patient Roles: Parent (Divorced.  Has 1 son, is extranged from 1 daughter.) Children: son, Jonathan Forbes @ [phone] - currently living in [place]  until he starts at [employer] in Fall.  (pt notes he also has a daughter, however, does not have any contact with her. Anticipated Caregiver: self Ability/Limitations of Caregiver: Has friends who can assist some.  Son can assist when not working. Caregiver Availability: Intermittent (Can call friends to assist.) Family Dynamics: pt notes good relationship with son who can "help out" while he is still in the area.  Social History Preferred language: English Religion: Christian Cultural Background: NA Education: HS Read: Yes Write: Yes Employment Status: Disabled Date Retired/Disabled/Unemployed: 2013 Freight forwarder Issues: None Guardian/Conservator: None - per MD, pt capable of making decisions on his own behalf   Abuse/Neglect Physical Abuse: Denies Verbal Abuse: Denies Sexual Abuse: Denies Exploitation of patient/patient's resources: Denies Self-Neglect: Denies  Emotional Status Pt's affect, behavior adn adjustment status:  pt pleasant, oriented and familiar to interviewer from prior CIR stay in Feb 2015.  Reports he is happy to have 2nd TKR surgery done.  Denies any s/s of any emotional distress.  Will monitor. Recent Psychosocial Issues: None Pyschiatric History: reports h/o depression and anxiety, however, no concerns with these currently. Substance Abuse History: None  Patient / Family Perceptions, Expectations & Goals Pt/Family understanding of illness & functional limitations: Pt and family with very good understanding  of the surgery performed and CIR process.   Premorbid pt/family roles/activities: Pt was independent PTA at home and in the community. Anticipated changes in roles/activities/participation: Little change anticipated if he reaches mod i goals.  Son did assist intermittently after prior CIR stay and willing to do this again. Pt/family expectations/goals: "I just want to get all this done and go home."  [employer]: None Premorbid Home Care/DME Agencies: Other (Comment) ([place] after prior TKR) Transportation available at discharge: yes  Discharge Planning Living Arrangements: Alone Support Systems: Children;Church/faith community;Friends/neighbors Type of Residence: Private residence Insurance Resources: Medicare;Medicaid (specify county) ([place] Financial Resources: SSD;SSI Living Expenses: Education officer, community Management: Patient Does the patient have any problems obtaining your medications?: No Home Management: pt Patient/Family Preliminary Plans: pt plans to return to his own apt with intermittent assist from son and local friends/ church members Social Work Anticipated Follow Up Needs: HH/OP Expected length of stay: 10-14 days  Clinical Impression Jonathan Forbes here on CIR following TKR and familiar to this SW from his prior CIR stay in 11/2013.  Pt with L-TKR in Feb and now had R-TKR.  Very familiar with process of surgery, CIR and HH f/u upon admit.  Denies any concerns about managing at home after d/c.  Anticipates intermittent assist from son upon d/c.  Will follow for d/c planning and support needs.  Jonathan Forbes 05/20/2014, 12:29 PM

## 2014-05-20 NOTE — Progress Notes (Signed)
Occupational Therapy Session Note  Patient Details  Name: Jonathan Forbes MRN: [id number] Date of Birth: 1951-04-07  Today's Date: 05/20/2014 Time: 0728-0828 Time Calculation (min): 60 min  Short Term Goals: Week 1:  OT Short Term Goal 1 (Week 1):  (LTG=STG)  Skilled Therapeutic Interventions/Progress Updates: ADL-retraining with focus on adherence to use of K.I., functional transfers, adapted bathing/dressing skills, and safety awareness.   Pt received sitting at edge of bed, right knee flexed partially without KI on.    OT re-educated pt on intent of KI and need maintain use when out of bed and ambulating however pt related his confusion on when/how to perform RLE exercises.  No CPM noted in room; pt reports need for assist with donning TEDs.   Pt required min assist with sit>stand at RW but was receptive to bathing at shower level this session.   Pt ambulated to bathroom to toilet standing and proceeded to tub bench in walk-in shower.   Pt required mod A to transfer on/off bench using grab bar with verbal cues for technique.   Pt completed bathing sitting and standing with steadying assist while washing buttocks standing.   Pt returned to edge of bed to dress, mod assist for lower body, and stood at sink to groom.   Pt recovered to bed at end of session awaiting RN for pain meds and dressing at RLE.  Therapy Documentation Precautions:  Precautions Precautions: Fall Required Braces or Orthoses: Knee Immobilizer - Right Knee Immobilizer - Right: Discontinue once straight leg raise with < 10 degree lag;On when out of bed or walking Restrictions Weight Bearing Restrictions: Yes RLE Weight Bearing: Weight bearing as tolerated  Vital Signs: Therapy Vitals Temp: 97.9 F (36.6 C) Temp src: Oral Pulse Rate: 74 Resp: 17 BP: 167/92 mmHg Oxygen Therapy SpO2: 91 % O2 Device: None (Room air)  Pain: Pain Assessment Pain Score: 6  Pain Type: Surgical pain Pain Location: Knee Pain  Orientation: Right  See FIM for current functional status  Therapy/Group: Individual Therapy  [place] 05/20/2014, 8:40 AM

## 2014-05-20 NOTE — IPOC Note (Signed)
Overall Plan of Care [facility]) Patient Details Name: Jonathan Forbes MRN: [id number] DOB: 1951/06/19  Admitting Diagnosis: RT TKR   Hospital Problems: Active Problems:   Right knee DJD     Functional Problem List: Nursing Bladder;Bowel;Medication Management;Pain;Safety;Skin Integrity  PT Balance;Endurance;Motor;Pain;Sensory;Skin Integrity;Edema  OT Balance;Endurance;Pain;Safety;Motor  SLP    TR         Basic ADL's: OT Grooming;Bathing;Dressing;Toileting     Advanced  ADL's: OT Simple Meal Preparation     Transfers: PT Bed Mobility;Bed to Chair;Car;Furniture;Floor  OT Toilet;Tub/Shower     Locomotion: PT Ambulation;Wheelchair Mobility;Stairs     Additional Impairments: OT None  SLP        TR      Anticipated Outcomes Item Anticipated Outcome  Self Feeding independent  Swallowing      Basic self-care  mod indenpent  Toileting  mod independent   Bathroom Transfers mod independent  Bowel/Bladder  Contient of bowel and bladder with modified indepedence assist  Transfers  Mod I  Locomotion  Mod I  Communication     Cognition     Pain  Pain level 3 or less  Safety/Judgment  Supervision   Therapy Plan: PT Intensity: Minimum of 1-2 x/day ,45 to 90 minutes PT Frequency: 5 out of 7 days PT Duration Estimated Length of Stay: 2 weeks OT Intensity: Minimum of 1-2 x/day, 45 to 90 minutes OT Frequency: 5 out of 7 days OT Duration/Estimated Length of Stay: 10-14 days         Team Interventions: Nursing Interventions Patient/Family Education;Bladder Management;Bowel Management;Disease Management/Prevention;Pain Management;Medication Management;Skin Care/Wound Management  PT interventions Ambulation/gait training;Community reintegration;Balance/vestibular training;Discharge planning;DME/adaptive equipment instruction;Functional electrical stimulation;Functional mobility training;Patient/family education;Pain management;Neuromuscular re-education;Stair  training;Therapeutic Activities;Therapeutic Exercise;UE/LE Strength taining/ROM;Skin care/wound management;Disease management/prevention  OT Interventions Balance/vestibular training;Community reintegration;Discharge planning;DME/adaptive equipment instruction;Functional mobility training;Pain management;Patient/family education;Self Care/advanced ADL retraining;Therapeutic Activities;Therapeutic Exercise;UE/LE Strength taining/ROM;Wheelchair propulsion/positioning  SLP Interventions    TR Interventions    SW/CM Interventions      Team Discharge Planning: Destination: PT-Home ,OT- Home , SLP-  Projected Follow-up: PT-Home health PT, OT-  Home health OT, SLP-  Projected Equipment Needs: PT-3 in 1 bedside comode;Rolling walker with 5" wheels, OT- 3 in 1 bedside comode (Need ), SLP-  Equipment Details: PT- , OT-  Patient/family involved in discharge planning: PT- Patient,  OT-Patient, SLP-   MD ELOS: 7d Medical Rehab Prognosis:  Excellent Assessment: 63 y.o. 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 He was admitted on 05/15/14 for R-TKR and is WBAT RLE. Post op with ABLA as well as reactive leucocytosis and problems with pain control.  Now requiring 24/7 Rehab RN,MD, as well as CIR level PT, OT and SLP.  Treatment team will focus on ADLs and mobility with goals set at Mod I   See Team Conference Notes for weekly updates to the plan of care

## 2014-05-20 NOTE — Progress Notes (Signed)
Orthopedic Tech Progress Note Patient Details:  Jonathan Forbes [birth year] [id number] CPM delivered to patient's room but not applied at this time due to patient beginning work with therapy.  CPM Right Knee CPM Right Knee: Off Right Knee Flexion (Degrees): 30 Right Knee Extension (Degrees): 0   [name] 05/20/2014, 1:05 PM

## 2014-05-20 NOTE — Care Management Note (Signed)
[facility] Individual Statement of Services  Patient Name:  Jonathan Forbes  Date:  05/20/2014  Welcome to the [facility].  Our goal is to provide you with an individualized program based on your diagnosis and situation, designed to meet your specific needs.  With this comprehensive rehabilitation program, you will be expected to participate in at least 3 hours of rehabilitation therapies Monday-Friday, with modified therapy programming on the weekends.  Your rehabilitation program will include the following services:  Physical Therapy (PT), Occupational Therapy (OT), 24 hour per day rehabilitation nursing, Therapeutic Recreaction (TR), Case Management (Social Worker), Rehabilitation Medicine, Nutrition Services and Pharmacy Services  Weekly team conferences will be held on Tuesdays to discuss your progress.  Your Social Worker will talk with you frequently to get your input and to update you on team discussions.  Team conferences with you and your family in attendance may also be held.  Expected length of stay: 10-14 days  Overall anticipated outcome: modified independent  Depending on your progress and recovery, your program may change. Your Social Worker will coordinate services and will keep you informed of any changes. Your Social Worker's name and contact numbers are listed  below.  The following services may also be recommended but are not provided by the [facility] will be made to provide these services after discharge if needed.  Arrangements include referral to agencies that provide these services.  Your insurance has been verified to be:  Medicare and Medicaid Your primary doctor is:  Dr. Gery Pray  Pertinent information will be shared with your doctor and your insurance company.  Social Worker:  [place], [place] or ([phone]   Information discussed with and copy given to patient by: Lennart Pall, 05/20/2014, 10:50 AM

## 2014-05-20 NOTE — Progress Notes (Addendum)
Physical Therapy Session Note  Patient Details  Name: Jonathan Forbes MRN: [id number] Date of Birth: May 20, 1951  Today's Date: 05/20/2014 Session 1 Time: 0902-1000 Time Calculation (min): 58 min Session 2 Time: 1300-1400 Time Calculation (min): 60 min  Short Term Goals: Week 1:  PT Short Term Goal 1 (Week 1): Pt will perform bed mobility with Min Guard PT Short Term Goal 2 (Week 1): Pt will perform transfers with Min Guard PT Short Term Goal 3 (Week 1): Pt will ambulate 75 Min Guard PT Short Term Goal 4 (Week 1): Pt will perform 5 stairs with Mod A  Skilled Therapeutic Interventions/Progress Updates:    Session 1: Pt received seated EOB w/ KI donned, agreeable to participate in therapy. Pt w/ MinGuard stand step turn transfer bed>w/c w/ RW, then propelled w/c 150' to rehab gym w/ S. Pt managed w/c parts w/ modA. On arrival to rehab gym pt expressed need for bathroom. TotalA to propel w/c back to room, Pt stood to urinate in toilet w/ MinGuardA and use of RW. TotalA to propel w/c back to gym, pt w/ SST w/ MinGuard w/c>mat table, then MinGuard to move sit>supine. In supine therapist doffed KI to allow for AROM and AAROM in R knee. Pt completed 2x10 quad sets, heel slides w/ assist from therapist, and hip abd AROM. Pt moved supine>sit w/ MinGuard, completed R heelcord passive stretching 2x30", then educated pt on self stretching with towel heel slide technique, 2x30". Also educated pt on self HS stretch with R foot propped on stool. Donned KI and pt ambulated 5' w/ RW w/ minGuard, pt noted he could bear more weight on R leg after stretching/exercises. Pt propelled w/c back to room, then performed SST to recliner w/ MinGuard. Pt left seated in recliner w/ all needs within reach.  Session 2: Pt received supine in bed, agreeable to participate in therapy. Pt expressed need for bathroom, assisted pt to bathroom ambulating 10' w/ MinGuard w/ RW. Pt urinated in standing at toilet, managed clothing w/ close  S. Pt propelled w/c w/ S and managed parts w/ ModA. Pt ambulated 100'x2 w/ RW w/ MinGuard. Pt demonstrated step-to gait on first bout of ambulation. On second bout, pt able to achieve step-thru gait w/ min VC's after therapist demonstration. Pt w/ SST w/c>mat w/ min guard and RW. Pt w/ supine and SL ex's on mat table including SAQ x10, SL clams x10 on R. Pt performed SLR w/ grossly 20 degree extension lag. Active assist knee flexion stretch in supine, on third stretch pt able to achieve 95 degrees flexion. Pt moved supine>sit w/ S, then performed x10 LAQ w/ slow eccentric release for quad control. Pt w/ SST mat>w/c, propelled w/c back to room. Pt w/ SST w/c>bed w/ RW w/ MinGuard, left supine in bed w/ all needs within reach.  Therapy Documentation Precautions:  Precautions Precautions: Fall Required Braces or Orthoses: Knee Immobilizer - Right Knee Immobilizer - Right: Discontinue once straight leg raise with < 10 degree lag;On when out of bed or walking Restrictions Weight Bearing Restrictions: Yes RLE Weight Bearing: Weight bearing as tolerated Vital Signs: Therapy Vitals BP: 130/78 mmHg Pain: Pain Assessment Pain Assessment: 0-10 Pain Score: 5  Pain Type: Surgical pain Pain Location: Knee Pain Orientation: Right Pain Descriptors / Indicators: Aching Pain Onset: On-going Patients Stated Pain Goal: 0 Pain Intervention(s):  (Pt pre-medicated prior to session) Locomotion : Ambulation Ambulation/Gait Assistance: 4: Min assist Wheelchair Mobility Distance: 150   See FIM for current functional status  Therapy/Group: Individual Therapy  Rada Hay 05/20/2014, 11:52 AM

## 2014-05-21 ENCOUNTER — Inpatient Hospital Stay (HOSPITAL_COMMUNITY): Payer: Medicare Other

## 2014-05-21 DIAGNOSIS — Z96659 Presence of unspecified artificial knee joint: Secondary | ICD-10-CM

## 2014-05-21 DIAGNOSIS — M1710 Unilateral primary osteoarthritis, unspecified knee: Secondary | ICD-10-CM

## 2014-05-21 DIAGNOSIS — I10 Essential (primary) hypertension: Secondary | ICD-10-CM

## 2014-05-21 DIAGNOSIS — IMO0002 Reserved for concepts with insufficient information to code with codable children: Secondary | ICD-10-CM

## 2014-05-21 DIAGNOSIS — G5710 Meralgia paresthetica, unspecified lower limb: Secondary | ICD-10-CM

## 2014-05-21 DIAGNOSIS — Z5189 Encounter for other specified aftercare: Secondary | ICD-10-CM

## 2014-05-21 NOTE — Progress Notes (Addendum)
Occupational Therapy Session Note  Patient Details  Name: Jonathan Forbes MRN: [id number] Date of Birth: 05-12-1951  Today's Date: 05/21/2014 Time: 0727-0827 Time Calculation (min): 60 min  Short Term Goals: Week 1:  OT Short Term Goal 1 (Week 1):  (LTG=STG)  Skilled Therapeutic Interventions/Progress Updates: ADL-retraining with focus on effective use of DME, endurance, transfers, functional mobility using RW, and adaptive dressing skills.  Pt received seated in recliner and receptive for planned treatment: bathing and dressing using standard tub/shower with tub bench.  Re-educated on need and use of DME.   Pt clarified that he has a tub bench but needs a replacement BSC.  Per pt., local DME provider already issued him a bariatric BSC but it would not fit over pt's toilet. He requested a replacement and the  DME provider recovered the [facility] but did not return with any replacement.     Pt engaged in sit>stand from recliner and performed an efficient stand-pivot transfer to w/c for escort to tub/shower unit.   Pt required use of RW to complete stand-pivot transfer from w/c to tub bench but managed his R-LE to enter tub on bench w/o assist.   Pt completed bathing unassisted and returned to w/c for dressing except for socks.   Pt able to don socks sitting at edge of bed but related that he does not usually wear socks when at home.  Pt then ambulated to RN station using RW for pain meds with min guard assist and back to his recliner again with only min guard assist.  Pt left with call light and phone placed within reach at end of session.     Therapy Documentation Precautions:  Precautions Precautions: Fall Required Braces or Orthoses: Knee Immobilizer - Right Knee Immobilizer - Right: Discontinue once straight leg raise with < 10 degree lag;On when out of bed or walking Restrictions Weight Bearing Restrictions: Yes RLE Weight Bearing: Weight bearing as tolerated  Vital Signs: Therapy  Vitals BP: 180/76 mmHg  Pain: Pain Assessment Pain Score: 6  Pain Type: Surgical pain Pain Location: Knee Pain Orientation: Right Pain Descriptors / Indicators: Aching  See FIM for current functional status  Therapy/Group: Individual Therapy  Second session: Time: 7902-4097 Time Calculation (min):  30 min  Pain Assessment: 5/10, right LE  Skilled Therapeutic Interventions: ADL-retraining with focus on functional mobility, sit<>stand, toileting, and simple meal prep.   Pt received seated in recliner awaiting therapist.   Pt reported need to urinate and ambulated to toilet with distant supervision, using RW after setup assist to don [place].   Pt then ambulated to sink to wash his hand and ambulated to kitchen again with only distant supervision.   Pt rested in w/c placed in kitchen during pt ed on kitchen safety and access to cabinets and appliance from RW.   Pt confided that his apartment is very small with a simple single cabinet layout of refrigerator, microwave (broken), mini-stove/oven, and sink.   Pt states that he doesn't cook complete meals and general prefers simple foods baked or heated on stovetop (to include canned foods and convenience meals).   Pt relates that he may rely on his church for meals s/p d/c.   After pt ed, pt demo'd ability to use RW, bend below level of his knees to retrieve items, use countertops to reduce fall risk, and manage supplies and utensils for cooking indepenently.   Pt declined to cook eggs as he doesn't eat them but was able to simulate all steps  for meal prep with attention to safety and w/o evidence of any balance deficits.   Pt reports that he has "stocked up" on frozen meals prior to admission in prep for probable decline in mobility as he remembered from similar procedure on left knee.   Pt returned to room and elected to remain seated in his w/c as noon meal arrived.  See FIM for current functional status  Therapy/Group: Individual  Therapy  Jonathan Forbes 05/21/2014, 10:56 AM

## 2014-05-21 NOTE — Progress Notes (Signed)
   Subjective:  Patient reports pain as mild.    Objective:   VITALS:   Filed Vitals:   05/20/14 1052 05/20/14 2314 05/21/14 0651 05/21/14 1009  BP: 130/78 135/75 150/90 180/76  Pulse:  72 54   Temp:  98.5 F (36.9 C) 98.6 F (37 C)   TempSrc:  Oral Oral   Resp:  19 18   Weight:      SpO2:  96% 93%     Neurologically intact Neurovascular intact Sensation intact distally Intact pulses distally Dorsiflexion/Plantar flexion intact Incision: dressing C/D/I and no drainage No cellulitis present Compartment soft   Lab Results  Component Value Date   WBC 8.7 05/20/2014   HGB 11.1* 05/20/2014   HCT 34.3* 05/20/2014   MCV 93.2 05/20/2014   PLT 236 05/20/2014     Assessment/Plan:     - DVT ppx - SCDs, ambulation, lovenox - WBAT right lower extremity - Pain control - Discharge planning - possibly home Friday per patient - needs f/u next tues vs. thurs  Marianna Payment 05/21/2014, 12:50 PM [phone]

## 2014-05-21 NOTE — Progress Notes (Signed)
Physical Therapy Session Note  Patient Details  Name: Jonathan Forbes MRN: [id number] Date of Birth: [birth year]  Today's Date: 05/21/2014 Session 1 Time: 1000-1100 Time Calculation (min): 60 min Session 2 Time:  -      Short Term Goals: Week 1:  PT Short Term Goal 1 (Week 1): Pt will perform bed mobility with Min Guard PT Short Term Goal 2 (Week 1): Pt will perform transfers with Min Guard PT Short Term Goal 3 (Week 1): Pt will ambulate 75 Min Guard PT Short Term Goal 4 (Week 1): Pt will perform 5 stairs with Mod A  Skilled Therapeutic Interventions/Progress Updates:    Session 1: Pt received supine in bed, agreeable to participate in therapy. Pt w/ S for all bed mobility, MaxA to don KI while seated EOB. Pt expressed need for bathroom, able to move sit<>stand and ambulate 15' to bathroom w/ S w/ use of RW. Pt propelled w/c 100' w/ S, then ambulated w/ RW 60' w/ S. Pt sat EOM and performed HS stretch w/ foot on stool and heelcord stretch with heel slide technique w/ min cueing, 10x10". Pt moved sit>supine w/ S, performed 2x10 SAQ w/ two towels under knee and 2x5 SLR (15-18 degree extension lag) and 2x5 heel slide (95 degrees active flexion). Pt moved supine>sit w/ S, therapist performed passive heelcord stretching for BLE while seated EOM 3x30". Pt ambulated w/ RW 120' to room w/ close S-CGA. Fit pt into 22x18 wheelchair to increase pt comfort and ability to self-propel. Pt left seated in recliner w/ all needs within reach.  Session 2: Pt received seated EOB, agreeable to participate in therapy. Pt donned KI w/ MinA, with assist for 1/4 velcro straps and cueing for 3/4. Pt ambulated 15' to bathroom w/ S w/ RW, managed clothing and urinated into toilet from standing w/ S. Pt ambulated 150' to rehab gym w/ S w/ RW, no LOB or buckling noted. Pt very eager to d/c KI, educated pt on use of KI for safety when upright, but noted that therapist is encouraged by pt's quad strength progress thus far. Pt  performed 10' at L5 on Nustep for RLE strength and ROM, 6' w/ BUE.BLE and 4' with BLE only. Pt very fatigued w/ using LE only. Pt ambulated 120' back to room w/ RW w/ close S. Pt left seated in recliner w/ KI doffed w/ all needs within reach.  Therapy Documentation Precautions:  Precautions Precautions: Fall Required Braces or Orthoses: Knee Immobilizer - Right Knee Immobilizer - Right: Discontinue once straight leg raise with < 10 degree lag;On when out of bed or walking Restrictions Weight Bearing Restrictions: Yes RLE Weight Bearing: Weight bearing as tolerated General:   Vital Signs: Therapy Vitals BP: 180/76 mmHg Pain: Pain Assessment Pain Score: 6  Pain Type: Surgical pain Pain Location: Knee Pain Orientation: Right Pain Descriptors / Indicators: Aching Mobility:   Locomotion : Ambulation Ambulation/Gait Assistance: 4: Min guard  Trunk/Postural Assessment :    Balance:   Exercises:   Other Treatments:    See FIM for current functional status  Therapy/Group: Individual Therapy  Rada Hay Rada Hay, PT, DPT 05/21/2014, 12:16 PM

## 2014-05-21 NOTE — Progress Notes (Signed)
Subjective/Complaints: 63 y.o. 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 He was admitted on 05/15/14 for R-TKR and is WBAT RLE. Post op with ABLA as well as reactive leucocytosis and problems with pain control.     Pain better. Pleased with progress.  No new issues Objective: Vital Signs: Blood pressure 150/90, pulse 54, temperature 98.6 F (37 C), temperature source Oral, resp. rate 18, weight 155 kg (341 lb 11.4 oz), SpO2 93.00%. No results found. Results for orders placed during the hospital encounter of 05/17/14 (from the past 72 hour(s))  CBC WITH DIFFERENTIAL     Status: Abnormal   Collection Time    05/20/14  5:20 AM      Result Value Ref Range   WBC 8.7  4.0 - 10.5 K/uL   RBC 3.68 (*) 4.22 - 5.81 MIL/uL   Hemoglobin 11.1 (*) 13.0 - 17.0 g/dL   HCT 34.3 (*) 39.0 - 52.0 %   MCV 93.2  78.0 - 100.0 fL   MCH 30.2  26.0 - 34.0 pg   MCHC 32.4  30.0 - 36.0 g/dL   RDW 13.9  11.5 - 15.5 %   Platelets 236  150 - 400 K/uL   Neutrophils Relative % 62  43 - 77 %   Neutro Abs 5.4  1.7 - 7.7 K/uL   Lymphocytes Relative 21  12 - 46 %   Lymphs Abs 1.8  0.7 - 4.0 K/uL   Monocytes Relative 12  3 - 12 %   Monocytes Absolute 1.1 (*) 0.1 - 1.0 K/uL   Eosinophils Relative 4  0 - 5 %   Eosinophils Absolute 0.4  0.0 - 0.7 K/uL   Basophils Relative 1  0 - 1 %   Basophils Absolute 0.0  0.0 - 0.1 K/uL  COMPREHENSIVE METABOLIC PANEL     Status: Abnormal   Collection Time    05/20/14  5:20 AM      Result Value Ref Range   Sodium 145  137 - 147 mEq/L   Potassium 3.7  3.7 - 5.3 mEq/L   Chloride 104  96 - 112 mEq/L   CO2 32  19 - 32 mEq/L   Glucose, Bld 111 (*) 70 - 99 mg/dL   BUN 14  6 - 23 mg/dL   Creatinine, Ser 0.82  0.50 - 1.35 mg/dL   Calcium 8.8  8.4 - 10.5 mg/dL   Total Protein 5.9 (*) 6.0 - 8.3 g/dL   Albumin 2.9 (*) 3.5 - 5.2 g/dL   AST 22  0 - 37 U/L   ALT 34  0 - 53 U/L   Alkaline Phosphatase 88  39 - 117 U/L   Total Bilirubin 0.5  0.3 - 1.2 mg/dL   GFR calc non Af Amer >90  >90 mL/min   GFR calc Af Amer >90  >90 mL/min   Comment: (NOTE)     The eGFR has been calculated using the CKD EPI equation.     This calculation has not been validated in all clinical situations.     eGFR's persistently <90 mL/min signify possible Chronic Kidney     Disease.   Anion gap 9  5 - 15     HEENT: normal Cardio: RRR Resp: CTA B/L and unlabored GI: BS positive, Distention and non tender Extremity:  Edema Right knee Skin:   Wound C/D/I and midline incison with dressing  Neuro: Alert/Oriented, Lethargic, Cranial Nerve Abnormalities Neg  and Abnormal Motor 5/5 in BUE, 2- R HF trace KE, 4/5 ankle DF, LLE 5/5 Musc/Skel:  Incision with mild erythema, no drainage. Well approximated Gen NAD   Assessment/Plan: 1. Functional deficits secondary to Right TKR with morbid obesitiy which require 3+ hours per day of interdisciplinary therapy in a comprehensive inpatient rehab setting. Physiatrist is providing close team supervision and 24 hour management of active medical problems listed below. Physiatrist and rehab team continue to assess barriers to discharge/monitor patient progress toward functional and medical goals.   FIM: FIM - Bathing Bathing Steps Patient Completed: Chest;Right Arm;Front perineal area;Abdomen;Left Arm;Buttocks;Right upper leg;Left upper leg Bathing: 4: Min-Patient completes 8-9 65f10 parts or 75+ percent  FIM - Upper Body Dressing/Undressing Upper body dressing/undressing steps patient completed: Thread/unthread right sleeve of pullover shirt/dresss;Thread/unthread left sleeve of pullover shirt/dress;Put head through opening of pull over shirt/dress;Pull shirt over trunk Upper body dressing/undressing: 5: Set-up assist to: Obtain clothing/put away FIM - Lower Body Dressing/Undressing Lower body dressing/undressing steps patient completed: Thread/unthread left pants leg;Pull pants up/down Lower  body dressing/undressing: 3: Mod-Patient completed 50-74% of tasks  FIM - Toileting Toileting steps completed by patient: Adjust clothing prior to toileting;Performs perineal hygiene;Adjust clothing after toileting Toileting Assistive Devices: Grab bar or rail for support Toileting: 6: More than reasonable amount of time  FIM - TRadio producerDevices: WInsurance account managerTransfers: 5-To toilet/BSC: Supervision (verbal cues/safety issues);5-From toilet/BSC: Supervision (verbal cues/safety issues)  FIM - BControl and instrumentation engineerDevices: Walker;Orthosis Bed/Chair Transfer: 5: Supine > Sit: Supervision (verbal cues/safety issues);4: Sit > Supine: Min A (steadying pt. > 75%/lift 1 leg);4: Bed > Chair or W/C: Min A (steadying Pt. > 75%)  FIM - Locomotion: Wheelchair Distance: 150 Locomotion: Wheelchair: 5: Travels 150 ft or more: maneuvers on rugs and over door sills with supervision, cueing or coaxing FIM - Locomotion: Ambulation Locomotion: Ambulation Assistive Devices: WAdministratorAmbulation/Gait Assistance: 4: Min assist Locomotion: Ambulation: 2: Travels 50 - 149 ft with minimal assistance (Pt.>75%)  Comprehension Comprehension Mode: Auditory Comprehension: 6-Follows complex conversation/direction: With extra time/assistive device  Expression Expression Mode: Verbal Expression: 7-Expresses complex ideas: With no assist  Social Interaction Social Interaction: 6-Interacts appropriately with others with medication or extra time (anti-anxiety, antidepressant).  Problem Solving Problem Solving: 7-Solves complex problems: Recognizes & self-corrects  Memory Memory: 5-Recognizes or recalls 90% of the time/requires cueing < 10% of the time  Medical Problem List and Plan:  1. Functional deficits secondary to Endstage OA right Knee s/p TKR   -dc cpm--knee rom near 90 2. DVT Prophylaxis/Anticoagulation: Pharmaceutical: Lovenox  3.  Pain Management: Continue celebrex for now. Increase oxycontin  4. H/o depression with anxiety disorder/Mood: Continue Viibryd. Team to provide ego support. LCSW to follow for evaluation and support.  5. Neuropsych: This patient is capable of making decisions on his own behalf.  6. Skin/Wound Care: Monitor daily for stability, any signs of infection or breakdown.   -dc foam dressing  -keep dry  7. Reactive leucocytosis: Resolved. No signs of infection noted.   8. ABLA: Monitor for stability. Will recheck labs on 07/27  9. HTN: Monitor every 8 hours. Continue Lasix, lisinopril and coreg. Monitor renal status with addition of celebrex. Fair control 10.Constipation improved after enema 11. Morbid obesity: Pressure relief measures. Bariatric AE.  -home sleep study scheduled? 12.  Low back strain, will start sports creme   LOS (Days) 4 A FACE TO FACE EVALUATION WAS PERFORMED  SWARTZ,ZACHARY T 05/21/2014, 7:46 AM

## 2014-05-22 ENCOUNTER — Inpatient Hospital Stay (HOSPITAL_COMMUNITY): Payer: Medicare Other | Admitting: Rehabilitation

## 2014-05-22 ENCOUNTER — Encounter (HOSPITAL_COMMUNITY): Payer: Medicare Other | Admitting: Occupational Therapy

## 2014-05-22 ENCOUNTER — Inpatient Hospital Stay (HOSPITAL_COMMUNITY): Payer: Medicare Other | Admitting: Physical Therapy

## 2014-05-22 ENCOUNTER — Inpatient Hospital Stay (HOSPITAL_COMMUNITY): Payer: Medicare Other | Admitting: *Deleted

## 2014-05-22 ENCOUNTER — Inpatient Hospital Stay (HOSPITAL_COMMUNITY): Payer: Medicare Other

## 2014-05-22 NOTE — Progress Notes (Signed)
Occupational Therapy Session Note  Patient Details  Name: Jonathan Forbes MRN: [id number] Date of Birth: 1951-04-05  Today's Date: 05/22/2014 Time: 0800-0845 Time Calculation (min): 45 min  Short Term Goals: Week 1:  OT Short Term Goal 1 (Week 1):  (LTG=STG)  Skilled Therapeutic Interventions/Progress Updates:    1:1 Pt  Hesitant to wear KI with stanidng and functional ambulation so continued to provide education on reasoning and purpose of KI in healing. Self care retraining at shower level (pt's choice). Focus on sit to stand, standing balance without UE support, functional ambulation around the room with RW and KI on, toileting, and d/c planning. Also performed right LE strengthening exercises to promote strength and weight bearing through LE for ADLs in standing.   Therapy Documentation Precautions:  Precautions Precautions: Fall Required Braces or Orthoses: Knee Immobilizer - Right Knee Immobilizer - Right: Discontinue once straight leg raise with < 10 degree lag;On when out of bed or walking Restrictions Weight Bearing Restrictions: Yes RLE Weight Bearing: Weight bearing as tolerated General:   Vital Signs:   Pain:  5/10 applied ice after session   See FIM for current functional status  Therapy/Group: Individual Therapy  [name] [facility] 05/22/2014, 8:56 AM

## 2014-05-22 NOTE — Progress Notes (Signed)
Social Work Patient ID: Jonathan Forbes, male   DOB: 04/30/1951, 63 y.o.   MRN: [id number]  Met yesterday afternoon with pt to review team conference.  Pt aware and agreeable with targeted d/c date of 7/31 with mod i goals.  Pleased with his progress and feels he will be ready to go.  No concerns.  HOYLE, LUCY, LCSW  

## 2014-05-22 NOTE — Progress Notes (Signed)
Physical Therapy Session Note  Patient Details  Name: TRESEAN MATTIX MRN: [id number] Date of Birth: Nov 05, 1950  Today's Date: 05/22/2014 Time: 9242-6834 Time Calculation (min): 30 min  Short Term Goals: Week 1:  PT Short Term Goal 1 (Week 1): Pt will perform bed mobility with Min Guard PT Short Term Goal 2 (Week 1): Pt will perform transfers with Min Guard PT Short Term Goal 3 (Week 1): Pt will ambulate 75 Min Guard PT Short Term Goal 4 (Week 1): Pt will perform 5 stairs with Mod A  Skilled Therapeutic Interventions/Progress Updates:   Pt received sitting on EOB, agreeable to therapy session. Pt states he needs to use restroom prior to session, therefore ambulated to/from restroom with RW at S level.  Min cues for safe RW placement.  Pt ambulated to/from therapy gym with RW at S level.  Again, min cues for upright posture and maintaining safe position inside of RW.  Once in therapy gym, worked on knee flex/ext with RLE placed on 4" step and elevating/lowering LLE x 10 reps.  Pt with increased pain, therefore D/C'd activity and transitioned to seated nustep x 8 mins at level 4 resistance with BUEs/LEs to increase ROM in R knee.  Pt able to reach 90 deg with ease and max verbal cues for slower motions with arms, however has difficulty and increased pain past 90 deg.  Progressed to performing seated knee flex with towel under foot for decreased resistance.  Pt able to get well past 90 deg in this position, however did note continued discomfort.  Ended session with 10 reps of mini squats using RW as light UE support.  Tolerated well and ambulated back to room as stated above.  Left in bed with ice pack donned and all needs in reach.   Therapy Documentation Precautions:  Precautions Precautions: Fall Required Braces or Orthoses: Knee Immobilizer - Right Knee Immobilizer - Right: Discontinue once straight leg raise with < 10 degree lag;On when out of bed or walking Restrictions Weight Bearing  Restrictions: Yes RLE Weight Bearing: Weight bearing as tolerated   Vital Signs: Therapy Vitals Temp: 98.8 F (37.1 C) Temp src: Oral Pulse Rate: 67 Resp: 18 BP: 153/78 mmHg Patient Position (if appropriate): Lying Oxygen Therapy SpO2: 98 % O2 Device: None (Room air) Pain: pt with increased pain during this session, donned ice pack at end of session.    Locomotion : Ambulation Ambulation/Gait Assistance: 5: Supervision   See FIM for current functional status  Therapy/Group: Individual Therapy  Denice Bors 05/22/2014, 4:14 PM

## 2014-05-22 NOTE — Progress Notes (Signed)
Physical Therapy Session Note  Patient Details  Name: Jonathan Forbes MRN: [id number] Date of Birth: 11-09-1950  Today's Date: 05/22/2014 Time: 0900-1000 Time Calculation (min): 60 min  Short Term Goals: Week 1:  PT Short Term Goal 1 (Week 1): Pt will perform bed mobility with Min Guard PT Short Term Goal 2 (Week 1): Pt will perform transfers with Min Guard PT Short Term Goal 3 (Week 1): Pt will ambulate 75 Min Guard PT Short Term Goal 4 (Week 1): Pt will perform 5 stairs with Mod A  Skilled Therapeutic Interventions/Progress Updates:    Therapeutic Exercise: PT instructs pt in terminal R knee extension in supine 10 second holds x 10 reps - pt able to achieve terminal knee extension within 3 to 5 degrees of straight on most reps.  PT instructs pt in open chain R knee flexion via heel slides x 10 reps - pt able to achieve 105 degrees of knee flexion on best attempts. L knee has knee flexion ROM to 137 degrees.  PT instructs pt in bridges x 10 reps for strengthening to assist pt in making sit to stands easier.  PT instructs pt in R hip abduction and R hip adduction while in L side lie x 10 reps for strengthening and improved hip stability during gait.   Gait Training: PT instructs pt in ambulation with heavy duty RW x 190' req SBA for safety - no knee brace due to pt's ability to achieve SLR with only <= 10 degree knee extension lag - no instability of R LE noted. PT updated pt's pink sheet in the bedroom for communication with all staff.   Therapeutic Activity: PT instructs pt in doffing non-skid socks, donning shoes, and doffing shoes req set up assist to bring items to pt.  PT instructs pt in mat mobility supine to/from sit req SBA and increased time, as well as verbal cues for technique. PT instructs pt in bed to/from w/c transfers with RW req SBA due to pt demonstrating impulsiveness in attempting to transfer without AD, but balance too poor to support pt.   Neuromuscular  Reeducation: PT instructs pt in TUG test for balance and pt scores 18 seconds, indicating at risk of falling. PT instructs pt to always use RW when ambluating, as pt demonstrates impulsiveness trying to transfer without AD.   Pt verbalized irritation with this PT during straight leg raise exercise, angry that PT corrected pt's technique. However, pt ultimately agreed to work with PT and irritation seemed to subside as the treatment continued. Pt demonstrates good R knee extension ROM, but knee flexion ROM continues to be limited. PT instructed pt in mat/bed level exercises for R LE knee ROM and strengthening that he can do when he gets home and gave pt handouts. Pt graduated from needing to wear a R knee immobilizer brace, today, and no episodes of buckling or near buckling noted. Pt does continue to demonstrate a balance impairment, evidenced by his TUG score (18 seconds). This was interpreted to pt and PT instructed pt to continue to use RW at all times with mobility, due to PT observing pt impulsively standing and attempting to transfer with RW out of reach. Pt will benefit on continued R knee ROM exercises, generalized strengthening and conditioning, as well as dynamic standing balance reeducation.    Therapy Documentation Precautions:  Precautions Precautions: Fall Required Braces or Orthoses: Knee Immobilizer - Right Knee Immobilizer - Right: Discontinue once straight leg raise with < 10 degree lag;On when  out of bed or walking Restrictions Weight Bearing Restrictions: Yes RLE Weight Bearing: Weight bearing as tolerated       Pain: Pain Assessment Pain Assessment: 0-10 Pain Score: 4  Pain Type: Surgical pain Pain Location: Knee Pain Orientation: Right Pain Onset: On-going Pain Intervention(s): Medication (See eMAR);Repositioned Multiple Pain Sites: No      See FIM for current functional status  Therapy/Group: Individual Therapy  Ubah Radke M 05/22/2014, 9:05 AM

## 2014-05-22 NOTE — Progress Notes (Signed)
Subjective/Complaints: 63 y.o. 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 He was admitted on 05/15/14 for R-TKR and is WBAT RLE. Post op with ABLA as well as reactive leucocytosis and problems with pain control.     Pain better. Pleased with progress.  No new issues Objective: Vital Signs: Blood pressure 151/75, pulse 65, temperature 97.8 F (36.6 C), temperature source Oral, resp. rate 20, weight 155 kg (341 lb 11.4 oz), SpO2 90.00%. No results found. Results for orders placed during the hospital encounter of 05/17/14 (from the past 72 hour(s))  CBC WITH DIFFERENTIAL     Status: Abnormal   Collection Time    05/20/14  5:20 AM      Result Value Ref Range   WBC 8.7  4.0 - 10.5 K/uL   RBC 3.68 (*) 4.22 - 5.81 MIL/uL   Hemoglobin 11.1 (*) 13.0 - 17.0 g/dL   HCT 34.3 (*) 39.0 - 52.0 %   MCV 93.2  78.0 - 100.0 fL   MCH 30.2  26.0 - 34.0 pg   MCHC 32.4  30.0 - 36.0 g/dL   RDW 13.9  11.5 - 15.5 %   Platelets 236  150 - 400 K/uL   Neutrophils Relative % 62  43 - 77 %   Neutro Abs 5.4  1.7 - 7.7 K/uL   Lymphocytes Relative 21  12 - 46 %   Lymphs Abs 1.8  0.7 - 4.0 K/uL   Monocytes Relative 12  3 - 12 %   Monocytes Absolute 1.1 (*) 0.1 - 1.0 K/uL   Eosinophils Relative 4  0 - 5 %   Eosinophils Absolute 0.4  0.0 - 0.7 K/uL   Basophils Relative 1  0 - 1 %   Basophils Absolute 0.0  0.0 - 0.1 K/uL  COMPREHENSIVE METABOLIC PANEL     Status: Abnormal   Collection Time    05/20/14  5:20 AM      Result Value Ref Range   Sodium 145  137 - 147 mEq/L   Potassium 3.7  3.7 - 5.3 mEq/L   Chloride 104  96 - 112 mEq/L   CO2 32  19 - 32 mEq/L   Glucose, Bld 111 (*) 70 - 99 mg/dL   BUN 14  6 - 23 mg/dL   Creatinine, Ser 0.82  0.50 - 1.35 mg/dL   Calcium 8.8  8.4 - 10.5 mg/dL   Total Protein 5.9 (*) 6.0 - 8.3 g/dL   Albumin 2.9 (*) 3.5 - 5.2 g/dL   AST 22  0 - 37 U/L   ALT 34  0 - 53 U/L   Alkaline Phosphatase 88  39 - 117 U/L   Total Bilirubin 0.5  0.3 - 1.2 mg/dL   GFR calc non Af Amer >90  >90 mL/min   GFR calc Af Amer >90  >90 mL/min   Comment: (NOTE)     The eGFR has been calculated using the CKD EPI equation.     This calculation has not been validated in all clinical situations.     eGFR's persistently <90 mL/min signify possible Chronic Kidney     Disease.   Anion gap 9  5 - 15     HEENT: normal Cardio: RRR Resp: CTA B/L and unlabored GI: BS positive, Distention and non tender Extremity:  Edema Right knee Skin:   Wound C/D/I and midline incison with dressing  Neuro: Alert/Oriented, Lethargic, Cranial Nerve Abnormalities Neg  and Abnormal Motor 5/5 in BUE, 2- R HF trace KE, 4/5 ankle DF, LLE 5/5 Musc/Skel:  Incision with mild erythema, no drainage. Well approximated Gen NAD   Assessment/Plan: 1. Functional deficits secondary to Right TKR with morbid obesitiy which require 3+ hours per day of interdisciplinary therapy in a comprehensive inpatient rehab setting. Physiatrist is providing close team supervision and 24 hour management of active medical problems listed below. Physiatrist and rehab team continue to assess barriers to discharge/monitor patient progress toward functional and medical goals.   FIM: FIM - Bathing Bathing Steps Patient Completed: Chest;Right Arm;Left Arm;Abdomen;Front perineal area;Buttocks;Right upper leg;Left upper leg;Right lower leg (including foot);Left lower leg (including foot) Bathing: 5: Supervision: Safety issues/verbal cues  FIM - Upper Body Dressing/Undressing Upper body dressing/undressing steps patient completed: Thread/unthread right sleeve of pullover shirt/dresss;Thread/unthread left sleeve of pullover shirt/dress;Put head through opening of pull over shirt/dress;Pull shirt over trunk Upper body dressing/undressing: 5: Set-up assist to: Obtain clothing/put away FIM - Lower Body Dressing/Undressing Lower body dressing/undressing steps patient completed:  Thread/unthread right pants leg;Thread/unthread left pants leg;Pull pants up/down Lower body dressing/undressing: 4: Min-Patient completed 75 plus % of tasks  FIM - Toileting Toileting steps completed by patient: Adjust clothing prior to toileting;Performs perineal hygiene;Adjust clothing after toileting Toileting Assistive Devices: Grab bar or rail for support Toileting: 5: Supervision: Safety issues/verbal cues  FIM - Radio producer Devices: Insurance account manager Transfers: 5-To toilet/BSC: Supervision (verbal cues/safety issues);5-From toilet/BSC: Supervision (verbal cues/safety issues)  FIM - Control and instrumentation engineer Devices: Walker;Bed rails Bed/Chair Transfer: 5: Supine > Sit: Supervision (verbal cues/safety issues);5: Bed > Chair or W/C: Supervision (verbal cues/safety issues);5: Chair or W/C > Bed: Supervision (verbal cues/safety issues);5: Sit > Supine: Supervision (verbal cues/safety issues)  FIM - Locomotion: Wheelchair Distance: 150 Locomotion: Wheelchair: 5: Travels 150 ft or more: maneuvers on rugs and over door sills with supervision, cueing or coaxing FIM - Locomotion: Ambulation Locomotion: Ambulation Assistive Devices: Administrator Ambulation/Gait Assistance: 4: Min guard Locomotion: Ambulation: 5: Travels 150 ft or more with supervision/safety issues  Comprehension Comprehension Mode: Auditory Comprehension: 6-Follows complex conversation/direction: With extra time/assistive device  Expression Expression Mode: Verbal Expression: 7-Expresses complex ideas: With no assist  Social Interaction Social Interaction: 4-Interacts appropriately 75 - 89% of the time - Needs redirection for appropriate language or to initiate interaction.  Problem Solving Problem Solving: 7-Solves complex problems: Recognizes & self-corrects  Memory Memory: 5-Recognizes or recalls 90% of the time/requires cueing < 10% of the time  Medical  Problem List and Plan:  1. Functional deficits secondary to Endstage OA right Knee s/p TKR   -knee ROM 90+ 2. DVT Prophylaxis/Anticoagulation: Pharmaceutical: Lovenox  3. Pain Management: Continue celebrex for now. Increase oxycontin  4. H/o depression with anxiety disorder/Mood: Continue Viibryd. Team to provide ego support. LCSW to follow for evaluation and support.  5. Neuropsych: This patient is capable of making decisions on his own behalf.  6. Skin/Wound Care: Monitor daily for stability, any signs of infection or breakdown.   -keep dry  7. Reactive leucocytosis: Resolved. No signs of infection noted.   8. ABLA: hgb 11.1  9. HTN: Monitor every 8 hours. Continue Lasix, lisinopril and coreg. Borderline  -probably will hold tight---adjust potentially as outpt 10.Constipation improved after enema 11. Morbid obesity: Pressure relief measures. Bariatric AE.  -home sleep study scheduled? 12.  Low back strain, will start sports creme   LOS (Days) 5 A FACE TO FACE EVALUATION WAS PERFORMED  SWARTZ,ZACHARY T 05/22/2014,  7:56 AM

## 2014-05-22 NOTE — Progress Notes (Signed)
Social Work Patient ID: Jonathan Forbes, male   DOB: 1950-11-13, 63 y.o.   MRN: [id number]  Lennart Pall, LCSW Social Worker Signed  Patient Care Conference Service date: 05/22/2014 9:38 AM  Inpatient RehabilitationTeam Conference and Plan of Care Update Date: 05/21/2014   Time: 2:40 PM     Patient Name: Jonathan Forbes       Medical Record Number: [id number]   Date of Birth: 1951-02-28 Sex: Male         Room/Bed: 4M04C/4M04C-01 Payor Info: Payor: MEDICARE / Plan: MEDICARE PART A AND B / Product Type: *No Product type* /   Admitting Diagnosis: RT TKR   Admit Date/Time:  05/17/2014  7:08 PM Admission Comments: No comment available   Primary Diagnosis:  <principal problem not specified> Principal Problem: <principal problem not specified>    Patient Active Problem List     Diagnosis  Date Noted   .  Right knee DJD  05/17/2014   .  Osteoarthritis of right knee  05/15/2014   .  OSA (obstructive sleep apnea)  04/22/2014   .  HTN (hypertension)  12/14/2013   .  Meralgia paraesthetica  12/14/2013   .  Postoperative anemia due to acute blood loss  12/14/2013   .  OA (osteoarthritis) of knee  12/11/2013   .  Osteoarthritis of left knee  12/05/2013   .  Total knee replacement status  12/05/2013     Expected Discharge Date: Expected Discharge Date: 05/24/14  Team Members Present: Physician leading conference: Dr. Alger Simons Social Worker Present: Lennart Pall, LCSW Nurse Present: Elliot Cousin, RN PT Present: Raylene Everts, Lorriane Shire, PT OT Present: Chrys Racer, Rhetta Mura, OT;Jennifer Tamala Julian, OT;Frank Leslie, OT Other (Discipline and Name): Danne Baxter, RN [facility]) PPS Coordinator present : Daiva Nakayama, RN, CRRN;Becky Alwyn Ren, PT        Current Status/Progress  Goal  Weekly Team Focus   Medical     right TKA. knee moving well. pain control improving. morbidly obese  improve safety, activity tolerance  wound care, sleep   Bowel/Bladder     Continent of bowel and  bladder. LBM 05/19/14  Pt to remain continent of bowel and bladder  Monitor   Swallow/Nutrition/ Hydration            ADL's     Overall supervision for BADL and transfers  Modified Independent for BADL  Transfers, sit<>stand, adapted bathing/dressing, DME/AE instruction   Mobility     overall supervision, ambulating up to 120' w/ RW  mod (I)  endurance, RLE strength, ambulation   Communication            Safety/Cognition/ Behavioral Observations           Pain     Oxycodone 10mg  q 12hrs, pt reports effectiveness  <3  Assess for need for prn dose to minimize breakthrough pain   Skin     R knee incision with sutures intact, edemaous, with optsite intact  No additonal skin breakdown  Assess for appropriate healing    Rehab Goals Patient on target to meet rehab goals: Yes *See Care Plan and progress notes for long and short-term goals.    Barriers to Discharge:  none     Possible Resolutions to Barriers:    none     Discharge Planning/Teaching Needs:    home alone at mod i goals      Team Discussion:    Pt making excellent progress and on target to meet mod i goals.  No concerns   Revisions to Treatment Plan:    None    Continued Need for Acute Rehabilitation Level of Care: The patient requires daily medical management by a physician with specialized training in physical medicine and rehabilitation for the following conditions: Daily direction of a multidisciplinary physical rehabilitation program to ensure safe treatment while eliciting the highest outcome that is of practical value to the patient.: Yes Daily medical management of patient stability for increased activity during participation in an intensive rehabilitation regime.: Yes Daily analysis of laboratory values and/or radiology reports with any subsequent need for medication adjustment of medical intervention for : Post surgical problems;Neurological problems  Javarri Segal 05/22/2014, 9:39 AM

## 2014-05-22 NOTE — Patient Care Conference (Signed)
Inpatient RehabilitationTeam Conference and Plan of Care Update Date: 05/21/2014   Time: 2:40 PM    Patient Name: Jonathan Forbes      Medical Record Number: [id number]  Date of Birth: [birth year] Sex: Male         Room/Bed: 4M04C/4M04C-01 Payor Info: Payor: MEDICARE / Plan: MEDICARE PART A AND B / Product Type: *No Product type* /    Admitting Diagnosis: RT TKR   Admit Date/Time:  05/17/2014  7:08 PM Admission Comments: No comment available   Primary Diagnosis:  <principal problem not specified> Principal Problem: <principal problem not specified>  Patient Active Problem List   Diagnosis Date Noted  . 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 Osteoarthritis of right knee 05/15/2014  . OSA (obstructive sleep apnea) 04/22/2014  . HTN (hypertension) 12/14/2013  . Meralgia paraesthetica 12/14/2013  . Postoperative anemia due to acute blood loss 12/14/2013  . OA (osteoarthritis) of knee 12/11/2013  . Osteoarthritis of left knee 12/05/2013  . Total knee replacement status 12/05/2013    Expected Discharge Date: Expected Discharge Date: 05/24/14  Team Members Present: Physician leading conference: Dr. Alger Simons Social Worker Present: Lennart Pall, LCSW Nurse Present: Elliot Cousin, RN PT Present: Raylene Everts, Lorriane Shire, PT OT Present: Chrys Racer, Rhetta Mura, OT;Jennifer Tamala Julian, OT;Frank [place], OT Other (Discipline and Name): Danne Baxter, RN [facility]) PPS Coordinator present : Daiva Nakayama, RN, CRRN;Becky Alwyn Ren, PT     Current Status/Progress Goal Weekly Team Focus  Medical   right TKA. knee moving well. pain control improving. morbidly obese  improve safety, activity tolerance  wound care, sleep   Bowel/Bladder   Continent of bowel and bladder. LBM 05/19/14  Pt to remain continent of bowel and bladder  Monitor   Swallow/Nutrition/ Hydration             ADL's   Overall supervision for BADL and transfers  Modified Independent for BADL  Transfers, sit<>stand, adapted  bathing/dressing, DME/AE instruction   Mobility   overall supervision, ambulating up to 120' w/ RW  mod (I)  endurance, RLE strength, ambulation   Communication             Safety/Cognition/ Behavioral Observations            Pain   Oxycodone 10mg  q 12hrs, pt reports effectiveness  <3  Assess for need for prn dose to minimize breakthrough pain   Skin   R knee incision with sutures intact, edemaous, with optsite intact  No additonal skin breakdown  Assess for appropriate healing    Rehab Goals Patient on target to meet rehab goals: Yes *See Care Plan and progress notes for long and short-term goals.  Barriers to Discharge: none    Possible Resolutions to Barriers:  none    Discharge Planning/Teaching Needs:  home alone at mod i goals      Team Discussion:  Pt making excellent progress and on target to meet mod i goals.  No concerns  Revisions to Treatment Plan:  None   Continued Need for Acute Rehabilitation Level of Care: The patient requires daily medical management by a physician with specialized training in physical medicine and rehabilitation for the following conditions: Daily direction of a multidisciplinary physical rehabilitation program to ensure safe treatment while eliciting the highest outcome that is of practical value to the patient.: Yes Daily medical management of patient stability for increased activity during participation in an intensive rehabilitation regime.: Yes Daily analysis of laboratory values and/or radiology reports with any subsequent need  for medication adjustment of medical intervention for : Post surgical problems;Neurological problems  Ezri Landers 05/22/2014, 9:39 AM

## 2014-05-22 NOTE — Progress Notes (Addendum)
Recreational Therapy Session Note  Patient Details  Name: Jonathan Forbes MRN: [id number] Date of Birth: Jan 09, 1951 Today's Date: 05/22/2014  Pain: no c/o Skilled Therapeutic Interventions/Progress Updates: Session focused on psychosocial support.  Pt reports he's having "a bad day".  Discussion with pt about the days events & clarifying of information in regards to use of KI & overall safe functional mobility.  Once information clarified, pt reports decreased anxiety.  Team made aware of pt concerns.  Pt scheduled for discharge 8/31, no formal eval/treatment implemented.  Pt set up assist level at this time for TR tasks.  Therapy/Group: Individual Therapy   [name] 05/22/2014, 5:15 PM

## 2014-05-22 NOTE — Progress Notes (Signed)
Physical Therapy Session Note  Patient Details  Name: Jonathan Forbes MRN: [id number] Date of Birth: January 14, 1951  Today's Date: 05/22/2014 Time:  -     Short Term Goals: Week 1:  PT Short Term Goal 1 (Week 1): Pt will perform bed mobility with Min Guard PT Short Term Goal 2 (Week 1): Pt will perform transfers with Min Guard PT Short Term Goal 3 (Week 1): Pt will ambulate 75 Min Guard PT Short Term Goal 4 (Week 1): Pt will perform 5 stairs with Mod A  Skilled Therapeutic Interventions/Progress Updates:    Pt received supine in bed w/ ice pack applied to R knee. Pt agreeable to participate in therapy after receiving nursing care. During nursing care therapist engaged pt in therapeutic conversation about home exercise program given earlier by PT [facility]. Pt not wearing KI during session, cleared by PT Mandy this AM. Pt w/ sit>stand w/ S, ambulated to bathroom to urinate at toilet while standing w/ S w/ use of RW. Session focused on endurance and community ambulation. Pt ambulated 120' to central elevators, then 150' elevators>atrium. Pt ambulated 500' on community surfaces (sidewalk, concrete, negotiated up/down ramp). Pt required extended seated rest break after walk, demonstrating decreased functional endurance. Pt ambulated to elevators and to room w/ RW, completed HEP x10 ea (SLR, heel slides, quad sets, bridges) in supine. Pt was overall supervision level for all ambulation during session. Pt left supine in bed w/ all needs within reach w/ ice pack applied to R knee.  Therapy Documentation Precautions:  Precautions Precautions: Fall Required Braces or Orthoses: Knee Immobilizer - Right Knee Immobilizer - Right: Discontinue once straight leg raise with < 10 degree lag;On when out of bed or walking Restrictions Weight Bearing Restrictions: Yes RLE Weight Bearing: Weight bearing as tolerated General:   Vital Signs: Therapy Vitals Temp: 97.8 F (36.6 C) Temp src: Oral Pulse Rate:  65 Resp: 20 BP: 151/75 mmHg Patient Position (if appropriate): Lying Oxygen Therapy SpO2: 90 % O2 Device: None (Room air) Pain:   Mobility:   Locomotion :    Trunk/Postural Assessment :    Balance:   Exercises:   Other Treatments:    See FIM for current functional status  Therapy/Group: Individual Therapy  Rada Hay Rada Hay, PT, DPT 05/22/2014, 7:49 AM

## 2014-05-23 ENCOUNTER — Inpatient Hospital Stay (HOSPITAL_COMMUNITY): Payer: Medicare Other | Admitting: Physical Therapy

## 2014-05-23 ENCOUNTER — Inpatient Hospital Stay (HOSPITAL_COMMUNITY): Payer: Medicare Other

## 2014-05-23 MED ORDER — TEMAZEPAM 15 MG PO CAPS
15.0000 mg | ORAL_CAPSULE | Freq: Every day | ORAL | Status: DC
  Administered 2014-05-23: 15 mg via ORAL
  Filled 2014-05-23: qty 1

## 2014-05-23 NOTE — Progress Notes (Signed)
Physical Therapy Session Note  Patient Details  Name: Jonathan Forbes MRN: [id number] Date of Birth: May 08, 1951  Today's Date: 05/23/2014 Time: 9179-1505  Time Calculation (min): 15 min Makeup Session  Short Term Goals: Week 1:  PT Short Term Goal 1 (Week 1): Pt will perform bed mobility with Min Guard PT Short Term Goal 2 (Week 1): Pt will perform transfers with Min Guard PT Short Term Goal 3 (Week 1): Pt will ambulate 75 Min Guard PT Short Term Goal 4 (Week 1): Pt will perform 5 stairs with Mod A  Skilled Therapeutic Interventions/Progress Updates:   Pt received sitting EOB, ice applied to RLE. Session focused on endurance with ambulation. Patient ambulated throughout rehab unit on tile and carpet using RW and mod I x 15 minutes without any standing or seated rest breaks. Pt returned to room and left sitting EOB, RN notified of patient request for pain medication.    Therapy Documentation Precautions:  Precautions Precautions: Fall Required Braces or Orthoses:  (no knee immobilizer needed any longer) Knee Immobilizer - Right: Discontinue once straight leg raise with < 10 degree lag;On when out of bed or walking Restrictions Weight Bearing Restrictions: Yes RLE Weight Bearing: Weight bearing as tolerated Pain: Pain Assessment Pain Assessment: 0-10 Pain Score: 6  Pain Type: Surgical pain Pain Location: Knee Pain Orientation: Right Pain Descriptors / Indicators: Sore Pain Frequency: Intermittent Pain Onset: On-going Patients Stated Pain Goal: 2 Pain Intervention(s): Rest;Repositioned, RN notified Multiple Pain Sites: No  See FIM for current functional status  Therapy/Group: Individual Therapy  Laretta Alstrom 05/23/2014, 3:34 PM

## 2014-05-23 NOTE — Progress Notes (Signed)
Subjective/Complaints: 63 y.o. 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 He was admitted on 05/15/14 for R-TKR and is WBAT RLE. Post op with ABLA as well as reactive leucocytosis and problems with pain control.     Sore from therapies yesterday. Had difficulties sleeping again last night.  No new issues Objective: Vital Signs: Blood pressure 137/80, pulse 77, temperature 98.2 F (36.8 C), temperature source Oral, resp. rate 18, weight 153.134 kg (337 lb 9.6 oz), SpO2 95.00%. No results found. No results found for this or any previous visit (from the past 72 hour(s)).   HEENT: normal Cardio: RRR Resp: CTA B/L and unlabored GI: BS positive, Distention and non tender Extremity:  Edema Right knee Skin:   Wound C/D/I and midline incison with dressing  Neuro: Alert/Oriented, Lethargic, Cranial Nerve Abnormalities Neg and Abnormal Motor 5/5 in BUE, 2- R HF trace KE, 4/5 ankle DF, LLE 5/5 Musc/Skel:  Incision with mild erythema, no drainage. Well approximated Gen NAD   Assessment/Plan: 1. Functional deficits secondary to Right TKR with morbid obesitiy which require 3+ hours per day of interdisciplinary therapy in a comprehensive inpatient rehab setting. Physiatrist is providing close team supervision and 24 hour management of active medical problems listed below. Physiatrist and rehab team continue to assess barriers to discharge/monitor patient progress toward functional and medical goals.   FIM: FIM - Bathing Bathing Steps Patient Completed: Chest;Right Arm;Left Arm;Abdomen;Front perineal area;Buttocks;Right upper leg;Left upper leg;Right lower leg (including foot);Left lower leg (including foot) Bathing: 5: Supervision: Safety issues/verbal cues  FIM - Upper Body Dressing/Undressing Upper body dressing/undressing steps patient completed: Thread/unthread right sleeve of pullover shirt/dresss;Thread/unthread left sleeve  of pullover shirt/dress;Put head through opening of pull over shirt/dress;Pull shirt over trunk Upper body dressing/undressing: 5: Supervision: Safety issues/verbal cues (in standing) FIM - Lower Body Dressing/Undressing Lower body dressing/undressing steps patient completed: Don/Doff left sock;Don/Doff right sock;Don/Doff right shoe;Don/Doff left shoe Lower body dressing/undressing: 5: Set-up assist to: Obtain clothing  FIM - Toileting Toileting steps completed by patient: Adjust clothing prior to toileting;Performs perineal hygiene;Adjust clothing after toileting Toileting Assistive Devices: Grab bar or rail for support Toileting: 6: More than reasonable amount of time  FIM - Radio producer Devices: Insurance account manager Transfers: 5-To toilet/BSC: Supervision (verbal cues/safety issues);5-From toilet/BSC: Supervision (verbal cues/safety issues)  FIM - Control and instrumentation engineer Devices: Walker;Arm rests Bed/Chair Transfer: 5: Supine > Sit: Supervision (verbal cues/safety issues);5: Sit > Supine: Supervision (verbal cues/safety issues);5: Bed > Chair or W/C: Supervision (verbal cues/safety issues);5: Chair or W/C > Bed: Supervision (verbal cues/safety issues)  FIM - Locomotion: Wheelchair Distance: 150 Locomotion: Wheelchair: 0: Activity did not occur FIM - Locomotion: Ambulation Locomotion: Ambulation Assistive Devices: Administrator Ambulation/Gait Assistance: 5: Supervision Locomotion: Ambulation: 5: Travels 150 ft or more with supervision/safety issues  Comprehension Comprehension Mode: Auditory Comprehension: 7-Follows complex conversation/direction: With no assist  Expression Expression Mode: Verbal Expression: 6-Expresses complex ideas: With extra time/assistive device  Social Interaction Social Interaction: 5-Interacts appropriately 90% of the time - Needs monitoring or encouragement for participation or  interaction.  Problem Solving Problem Solving: 5-Solves complex 90% of the time/cues < 10% of the time  Memory Memory: 6-More than reasonable amt of time  Medical Problem List and Plan:  1. Functional deficits secondary to Endstage OA right Knee s/p TKR   -knee ROM 90+ 2. DVT Prophylaxis/Anticoagulation: Pharmaceutical: Lovenox  3. Pain Management: Continue celebrex for now. Increase oxycontin  4. H/o depression with anxiety disorder/Mood: Continue Viibryd. Team to provide ego support. LCSW to follow for evaluation and support.  5. Neuropsych: This patient is capable of making decisions on his own behalf.  6. Skin/Wound Care: Monitor daily for stability, any signs of infection or breakdown.   -keep dry  7. Reactive leucocytosis: Resolved. No signs of infection noted.   8. ABLA: hgb 11.1  9. HTN: Monitor every 8 hours. Continue Lasix, lisinopril and coreg. Borderline  -probably will hold tight---adjust potentially as outpt 10.Constipation improved after enema 11. Morbid obesity: Pressure relief measures. Bariatric AE.  -home sleep study scheduled?  -added restoril for sleep 12.  Low back strain, will start sports creme   LOS (Days) 6 A FACE TO FACE EVALUATION WAS PERFORMED  Danial Hlavac T 05/23/2014, 8:01 AM

## 2014-05-23 NOTE — Progress Notes (Signed)
Occupational Therapy Session Note  Patient Details  Name: Jonathan Forbes MRN: [id number] Date of Birth: 1951/07/20  Today's Date: 05/23/2014 Time: 0829-0929  60 min  Short Term Goals: Week 1:  OT Short Term Goal 1 (Week 1):  (LTG=STG)  Skilled Therapeutic Interventions/Progress Updates: ADL-retraining with focus on discharge planning, re-ed on skilled use of DME/AE, transfers, functional mobility, and dynamic standing balance.   Pt received supine in bed and receptive for planned activity.   Pt performed bed mobility, transfer, and functional mobility to bathroom using only RW (no KI) with distant supervision only.   Pt toileted standing and then bathed using bath bench and LH sponge to wash back and feet, returning to edge of bed to dress.   Pt now able to don socks and shoes w/o need for AE, flexing left knee as needed with left hip abducted and LLE supported at bottom portion of bed.   Pt completed grooming standing a sink unassisted, supporting himself on counter with RW adjacent to counter.  Pt confirms no plan to wear either socks or shoes in his apartment and plans to pay out-of-pocket for occasional assist with cleaning his floor.    Pt left in room seated on edge of bed with ice pack on right knee to reduce pain.     Therapy Documentation Precautions:  Precautions Precautions: Fall Required Braces or Orthoses: Knee Immobilizer - Right Knee Immobilizer - Right: Discontinue once straight leg raise with < 10 degree lag;On when out of bed or walking Restrictions Weight Bearing Restrictions: Yes RLE Weight Bearing: Weight bearing as tolerated  Vital Signs: Therapy Vitals Temp: 98.2 F (36.8 C) Temp src: Oral Pulse Rate: 77 Resp: 18 BP: 137/80 mmHg Patient Position (if appropriate): Lying Oxygen Therapy SpO2: 95 % O2 Device: None (Room air)  Pain: 7/10, right knee; RN made aware    See FIM for current functional status  Therapy/Group: Individual  Therapy  Jonathan Forbes 05/23/2014, 9:26 AM

## 2014-05-23 NOTE — Progress Notes (Signed)
Physical Therapy Discharge Summary  Patient Details  Name: Jonathan Forbes MRN: [id number] Date of Birth: 28-Aug-1951  Today's Date: 05/23/2014 Time: 0930-1030 Session 2: 1400-1500 Time Calculation (min): 60 min Session 2: 60 minutes  Patient has met 2 of 4 long term goals due to improved activity tolerance, improved balance, increased strength, increased range of motion, decreased pain and improved coordination.  Patient to discharge at an ambulatory level Modified Independent.   Patient lives alone.  Reasons goals not met: Pt requires an assistive device for safe ambulation and pt continues to have difficulty on stairs due to nonoperative LE being operated on 6 months ago with a TKA and still not yet at 100%.   Recommendation:  Patient will benefit from ongoing skilled PT services in home health setting to continue to advance safe functional mobility, address ongoing impairments in R LE, and minimize fall risk.  Equipment: No equipment provided - Pt has all equipment from previous L knee replacement surgery.   Reasons for discharge: treatment goals met and discharge from hospital  Patient/family agrees with progress made and goals achieved: Yes  Therapeutic Interventions Gait Training: Pt demonstrates mod I ambulation 500' with HD RW - decreased R ankle dorsiflexion during initial contact, R forward trunk lurch due to R hip extensor weakness during R stance, wide BOS (likely premorbid).   Therapeutic Activity: Pt demonstrates mod I toilet transfer with RW, req grab bar use. PT educates pt re: obtaining arm rails for standing off of his low toilet at home and instructs him to check out a medical supply store. PT also explains that his HHPT will likely do a home safety evaluation.  Pt demonstrates Independent transfer recliner to/from bed, Independent bed mobility supine to/from sit without rail with good coordination.   Neuromuscular Reeducation: PT instructs pt in reaching tasks in  both sit and stand without AD moderate excursions - pt demonstrates ability to do so mod I in stand due to wide BOS (may be premorbid standing ability due to body habitus).   Session 2: Therapeutic Exercise: PT reviews pt's HEP: quad sets in supine - 10 seconds x 10 reps, SLR x 10 reps, heel slides x 10 reps, bridges x 10 reps, R hip abduction in side lie x 10 reps, R hip adduction in side lie x 10 reps. PT instructs pt on Nu-Step with B LEs only at L4-5 x 10 minutes for endurance and slight R knee flexion stretch due to chair positioning.   Gait Training: PT instructs pt in descending 8 stairs with B rails req CGA for safety in step-to pattern. Pt ascends stairs with SBA and B rails (8 stairs). PT instructs pt in both forward approach and backwards approach to ascend/descend a curb with RW req CGA for safety and verbal cues. PT instructs pt to ascend/descend a ramp with RW req SBA and verbal cues to take small steps on descend in order to control balance.   Pt has progressed well to mod I level during inpatient rehab stay. Pt now has HEP to continue therapy for when he d/c home. Pt has all DME for mobility already at home. Pt is safe to d/c home and continue with HHPT at d/c to maximize safe functional independence, especially on stairs.     PT Discharge Precautions/Restrictions Precautions Precautions: Fall Required Braces or Orthoses:  (no knee immobilizer needed any longer) Restrictions Weight Bearing Restrictions: Yes RLE Weight Bearing: Weight bearing as tolerated Vital Signs Therapy Vitals Pulse Rate: 71 BP: 164/93  mmHg Patient Position (if appropriate): Sitting Oxygen Therapy SpO2: 93 % O2 Device: None (Room air) Pain Pain Assessment Pain Assessment: 0-10 Pain Score: 7  Pain Type: Surgical pain Pain Location: Knee Pain Orientation: Right Pain Descriptors / Indicators: Sore Pain Onset: On-going Patients Stated Pain Goal: 0 Pain Intervention(s): Rest Multiple Pain Sites:  No Session 2: Pt reports R knee pain has decreased slightly to 6/10 since first session - rest and repositioning used to decrease pain.  Vision/Perception  Vision - Assessment Eye Alignment: Within Functional Limits Perception Comments: wfl  Cognition Overall Cognitive Status: Within Functional Limits for tasks assessed Arousal/Alertness: Awake/alert Orientation Level: Oriented X4 Attention: Focused;Sustained Focused Attention: Appears intact Sustained Attention: Appears intact Memory: Appears intact Awareness: Appears intact Problem Solving: Impaired Problem Solving Impairment: Functional complex (pt sometimes gets confused with multi-step functional commands and PT must reduce the number of commands for pt to understand) Safety/Judgment: Appears intact Sensation Sensation Light Touch: Appears Intact Proprioception: Appears Intact Coordination Gross Motor Movements are Fluid and Coordinated: Yes Fine Motor Movements are Fluid and Coordinated: Not tested Coordination and Movement Description: coordinated for pt's premorbid body habitus Finger Nose Finger Test: intact; slight intention tremor x 6 months  Motor  Motor Motor: Other (comment) Motor - Discharge Observations: slight antalgic gait due to R kne pain; no instability in R knee noted; postural alignment consistent with premorbid status  Mobility Bed Mobility Bed Mobility: Sit to Supine Supine to Sit: 7: Independent Sit to Supine: 7: Independent Transfers Transfers: Yes Sit to Stand: 7: Independent Stand to Sit: 7: Independent Stand Pivot Transfers: 6: Modified independent (Device/Increase time) (with RW)     Trunk/Postural Assessment  Cervical Assessment Cervical Assessment: Within Functional Limits Thoracic Assessment Thoracic Assessment: Within Functional Limits Lumbar Assessment Lumbar Assessment: Within Functional Limits  Balance Balance Balance Assessed: Yes Static Sitting Balance Static Sitting -  Balance Support: Feet supported;No upper extremity supported Static Sitting - Level of Assistance: 7: Independent Static Sitting - Comment/# of Minutes: unlimited Dynamic Sitting Balance Dynamic Sitting - Balance Support: No upper extremity supported;Feet supported Dynamic Sitting - Level of Assistance: 7: Independent Reach (Patient is able to reach ___ inches to right, left, forward, back): over 12 inches Dynamic Sitting - Balance Activities: Reaching for objects Static Standing Balance Static Standing - Balance Support: No upper extremity supported Static Standing - Level of Assistance: 6: Modified independent (Device/Increase time) (wide BOS to compensate; may be premorbid stance due to body habitus) Dynamic Standing Balance Dynamic Standing - Balance Support: No upper extremity supported Dynamic Standing - Level of Assistance: 6: Modified independent (Device/Increase time) (wide BOS to compensate; may be premorbid stance due to body habitus) Dynamic Standing - Balance Activities: Reaching for objects Extremity Assessment  RUE Assessment RUE Assessment: Within Functional Limits LUE Assessment LUE Assessment: Within Functional Limits RLE Assessment RLE Assessment: Exceptions to [facility] RLE AROM (degrees) Right Knee Extension: 3 Right Knee Flexion: 115 RLE Strength RLE Overall Strength Comments: hip flexion 4/5, knee extension 4/5, knee flexion 5/5, ankle DF 5/5 LLE Assessment LLE Assessment: Within Functional Limits  See FIM for current functional status  Elvert Cumpton M 05/23/2014, 10:23 AM

## 2014-05-24 DIAGNOSIS — IMO0002 Reserved for concepts with insufficient information to code with codable children: Secondary | ICD-10-CM

## 2014-05-24 DIAGNOSIS — Z96659 Presence of unspecified artificial knee joint: Secondary | ICD-10-CM

## 2014-05-24 DIAGNOSIS — M1710 Unilateral primary osteoarthritis, unspecified knee: Secondary | ICD-10-CM

## 2014-05-24 DIAGNOSIS — I10 Essential (primary) hypertension: Secondary | ICD-10-CM

## 2014-05-24 DIAGNOSIS — Z5189 Encounter for other specified aftercare: Secondary | ICD-10-CM

## 2014-05-24 DIAGNOSIS — G5710 Meralgia paresthetica, unspecified lower limb: Secondary | ICD-10-CM

## 2014-05-24 LAB — CREATININE, SERUM
CREATININE: 0.99 mg/dL (ref 0.50–1.35)
GFR, EST NON AFRICAN AMERICAN: 86 mL/min — AB (ref >90)

## 2014-05-24 MED ORDER — OXYCODONE HCL 10 MG PO TABS: 5.0000 mg | ORAL_TABLET | ORAL | Status: DC | PRN

## 2014-05-24 MED ORDER — TEMAZEPAM 15 MG PO CAPS: 15.0000 mg | ORAL_CAPSULE | Freq: Every evening | ORAL | Status: DC | PRN

## 2014-05-24 MED ORDER — POLYETHYLENE GLYCOL 3350 17 G PO PACK: 17.0000 g | PACK | Freq: Two times a day (BID) | ORAL | Status: DC

## 2014-05-24 MED ORDER — ENOXAPARIN SODIUM 40 MG/0.4ML ~~LOC~~ SOLN: 40.0000 mg | Freq: Every day | SUBCUTANEOUS | Status: DC

## 2014-05-24 MED ORDER — ENOXAPARIN SODIUM 30 MG/0.3ML ~~LOC~~ SOLN: 30.0000 mg | Freq: Two times a day (BID) | SUBCUTANEOUS | Status: DC

## 2014-05-24 MED ORDER — ASPIRIN EC 325 MG PO TBEC: 325.0000 mg | DELAYED_RELEASE_TABLET | Freq: Two times a day (BID) | ORAL | Status: DC

## 2014-05-24 MED ORDER — METHOCARBAMOL 500 MG PO TABS: 500.0000 mg | ORAL_TABLET | Freq: Four times a day (QID) | ORAL | Status: DC | PRN

## 2014-05-24 MED ORDER — TRAMADOL HCL 50 MG PO TABS: 50.0000 mg | ORAL_TABLET | Freq: Four times a day (QID) | ORAL | Status: DC | PRN

## 2014-05-24 MED ORDER — SENNOSIDES-DOCUSATE SODIUM 8.6-50 MG PO TABS: 2.0000 | ORAL_TABLET | Freq: Two times a day (BID) | ORAL | Status: DC

## 2014-05-24 MED ORDER — GRX ANALGESIC BALM EX OINT: 2.0000 g | TOPICAL_OINTMENT | Freq: Two times a day (BID) | CUTANEOUS | Status: DC

## 2014-05-24 MED ORDER — CELECOXIB 200 MG PO CAPS: 200.0000 mg | ORAL_CAPSULE | Freq: Every day | ORAL | Status: DC

## 2014-05-24 NOTE — Progress Notes (Signed)
Social Work  Discharge Note  The overall goal for the admission was met for:   Discharge location: Yes - home alone  Length of Stay: Yes - 7 days  Discharge activity level: Yes - modified independent  Home/community participation: Yes  Services provided included: MD, RD, PT, OT, RN, Pharmacy and [place]: Medicare and Medicaid  Follow-up services arranged: Home Health: PT via [facility], DME: bedside commode via [place] and Patient/Family has no preference for HH/DME agencies  Comments (or additional information):  Patient/Family verbalized understanding of follow-up arrangements: Yes  Individual responsible for coordination of the follow-up plan: patient  Confirmed correct DME delivered: Taje Littler 05/24/2014    Micajah Dennin

## 2014-05-24 NOTE — Progress Notes (Signed)
Pt. Got d/c orders and prescriptions,follow up appointments as well.Pt. Ready to go home with family.

## 2014-05-24 NOTE — Progress Notes (Signed)
Subjective/Complaints: 63 y.o. 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 He was admitted on 05/15/14 for R-TKR and is WBAT RLE. Post op with ABLA as well as reactive leucocytosis and problems with pain control.     Sore again from PT--worked very hard. Excited to go home.  No new issues Objective: Vital Signs: Blood pressure 168/85, pulse 64, temperature 97.6 F (36.4 C), temperature source Oral, resp. rate 18, weight 153.134 kg (337 lb 9.6 oz), SpO2 93.00%. No results found. Results for orders placed during the hospital encounter of 05/17/14 (from the past 72 hour(s))  CREATININE, SERUM     Status: Abnormal   Collection Time    05/24/14  6:35 AM      Result Value Ref Range   Creatinine, Ser 0.99  0.50 - 1.35 mg/dL   GFR calc non Af Amer 86 (*) >90 mL/min   GFR calc Af Amer >90  >90 mL/min   Comment: (NOTE)     The eGFR has been calculated using the CKD EPI equation.     This calculation has not been validated in all clinical situations.     eGFR's persistently <90 mL/min signify possible Chronic Kidney     Disease.     HEENT: normal Cardio: RRR Resp: CTA B/L and unlabored GI: BS positive, Distention and non tender Extremity:  Edema Right knee Skin:   Wound C/D/I and midline incison with dressing  Neuro: Alert/Oriented, Lethargic, Cranial Nerve Abnormalities Neg and Abnormal Motor 5/5 in BUE, 2- R HF trace KE, 4/5 ankle DF, LLE 5/5 Musc/Skel:  Incision with mild erythema, no drainage. Well approximated Gen NAD   Assessment/Plan: 1. Functional deficits secondary to Right TKR with morbid obesitiy which require 3+ hours per day of interdisciplinary therapy in a comprehensive inpatient rehab setting. Physiatrist is providing close team supervision and 24 hour management of active medical problems listed below. Physiatrist and rehab team continue to assess barriers to discharge/monitor patient progress toward  functional and medical goals  Dc home with [facility] follow up.   FIM: FIM - Bathing Bathing Steps Patient Completed: Chest;Right Arm;Left Arm;Abdomen;Front perineal area;Buttocks;Right upper leg;Left upper leg;Right lower leg (including foot);Left lower leg (including foot) Bathing: 6: Assistive device (Comment)  FIM - Upper Body Dressing/Undressing Upper body dressing/undressing steps patient completed: Thread/unthread right sleeve of pullover shirt/dresss;Thread/unthread left sleeve of pullover shirt/dress;Put head through opening of pull over shirt/dress;Pull shirt over trunk Upper body dressing/undressing: 6: More than reasonable amount of time FIM - Lower Body Dressing/Undressing Lower body dressing/undressing steps patient completed: Thread/unthread right pants leg;Thread/unthread left pants leg;Pull pants up/down;Fasten/unfasten pants;Don/Doff right sock;Don/Doff left sock;Don/Doff right shoe;Don/Doff left shoe;Fasten/unfasten right shoe;Fasten/unfasten left shoe (does not untie shoes) Lower body dressing/undressing: 6: More than reasonable amount of time  FIM - Toileting Toileting steps completed by patient: Adjust clothing prior to toileting;Adjust clothing after toileting;Performs perineal hygiene Toileting Assistive Devices: Grab bar or rail for support Toileting: 6: Assistive device: No helper  FIM - Radio producer Devices: Insurance account manager Transfers: 6-Assistive device: No helper;6-To toilet/ BSC;6-From toilet/BSC  FIM - Control and instrumentation engineer Devices: Environmental consultant;Arm rests Bed/Chair Transfer: 6: Assistive device: no helper;7: Supine > Sit: No assist;7: Sit > Supine: No assist;6: Bed > Chair or W/C: No assist;6: Chair or W/C > Bed: No assist  FIM - Locomotion: Wheelchair Distance: 150 Locomotion: Wheelchair: 0: Activity did not occur FIM - Locomotion: Ambulation Locomotion: Ambulation Assistive Devices: Environmental consultant -  Rolling Ambulation/Gait Assistance: 6: Modified independent (Device/Increase time) Locomotion: Ambulation: 6: Travels 150 ft or more with assistive device/no helper  Comprehension Comprehension Mode: Auditory Comprehension: 6-Follows complex conversation/direction: With extra time/assistive device  Expression Expression Mode: Verbal Expression: 6-Expresses complex ideas: With extra time/assistive device  Social Interaction Social Interaction: 7-Interacts appropriately with others - No medications needed.  Problem Solving Problem Solving: 6-Solves complex problems: With extra time  Memory Memory: 7-Complete Independence: No helper  Medical Problem List and Plan:  1. Functional deficits secondary to Endstage OA right Knee s/p TKR   -knee ROM 90+ 2. DVT Prophylaxis/Anticoagulation: Pharmaceutical: Lovenox  3. Pain Management: Continue celebrex for now. Increase oxycontin  4. H/o depression with anxiety disorder/Mood: Continue Viibryd. Team to provide ego support. LCSW to follow for evaluation and support.  5. Neuropsych: This patient is capable of making decisions on his own behalf.  6. Skin/Wound Care: Monitor daily for stability, any signs of infection or breakdown.   -keep dry, sutures out 7. Reactive leucocytosis: Resolved. No signs of infection noted.   8. ABLA: hgb 11.1  9. HTN: Monitor every 8 hours. Continue Lasix, lisinopril and coreg. Borderline  -follow up as outpt 10.Constipation improved after enema 11. Morbid obesity: Pressure relief measures. Bariatric AE.  -home sleep study scheduled?  -added restoril for sleep with benefit 12.  Low back strain, will start sports creme   LOS (Days) 7 A FACE TO FACE EVALUATION WAS PERFORMED  Jonathan Forbes T 05/24/2014, 7:52 AM

## 2014-05-24 NOTE — Progress Notes (Signed)
As per PA request,RN attempted to teach pt. How to give himself Lovenox injections,pt. Refused,he said he doesn't want to do it at the moment.Keep monitoring pt. Closely and assessing his needs.

## 2014-05-24 NOTE — Progress Notes (Signed)
Occupational Therapy Discharge Summary  Patient Details  Name: Jonathan Forbes MRN: [id number] Date of Birth: [birth year]  Today's Date: 05/26/2014  Patient has met 9 of 9 long term goals due to improved activity tolerance, improved balance, ability to compensate for deficits and improved awareness.  Patient to discharge at overall Modified Independent level.    Reasons goals not met: n/a  Recommendation:  Patient will not require ongoing skilled OT services to continue to advance functional skills.  Equipment: Tub bench  Reasons for discharge: treatment goals met  Patient/family agrees with progress made and goals achieved: Yes  OT Discharge Precautions/Restrictions  Precautions Precautions: Fall Restrictions Weight Bearing Restrictions: Yes RLE Weight Bearing: Weight bearing as tolerated  Pain Pain Assessment Pain Score: 0-No pain Faces Pain Scale: No hurt PAINAD (Pain Assessment in Advanced Dementia) Breathing: normal Negative Vocalization: none Facial Expression: smiling or inexpressive Body Language: relaxed Consolability: no need to console PAINAD Score: 0 Critical Care Pain Observation Tool (CPOT) Facial Expression: Relaxed, neutral Body Movements: Absence of movements Muscle Tension: Relaxed Compliance with ventilator (intubated pts.): N/A Vocalization (extubated pts.): N/A CPOT Total: 0  ADL ADL ADL Comments: see FIM  Vision/Perception  Vision- History Baseline Vision/History: Glaucoma Patient Visual Report: No change from baseline Vision- Assessment Eye Alignment: Within Functional Limits Perception Comments: WFL   Cognition Overall Cognitive Status: Within Functional Limits for tasks assessed Arousal/Alertness: Awake/alert Orientation Level: Oriented X4 Attention: Divided Divided Attention: Appears intact Memory: Appears intact Awareness: Appears intact Problem Solving: Appears intact Safety/Judgment: Appears  intact  Sensation Sensation Light Touch: Appears Intact Stereognosis: Appears Intact Hot/Cold: Appears Intact Proprioception: Appears Intact Coordination Gross Motor Movements are Fluid and Coordinated: Yes Fine Motor Movements are Fluid and Coordinated: Yes  Motor  Motor Motor: Within Functional Limits Motor - Discharge Observations: slight antalgic gait due to R kne pain; no instability in R knee noted; postural alignment consistent with premorbid status  Mobility  Bed Mobility Bed Mobility: Sit to Supine Supine to Sit: 7: Independent Sit to Supine: 7: Independent Transfers Transfers: Sit to Stand;Stand to Sit Sit to Stand: 7: Independent Stand to Sit: 7: Independent   Trunk/Postural Assessment  Cervical Assessment Cervical Assessment: Within Functional Limits Thoracic Assessment Thoracic Assessment: Within Functional Limits Lumbar Assessment Lumbar Assessment: Within Functional Limits   Balance Static Sitting Balance Static Sitting - Balance Support: Feet supported;No upper extremity supported Static Sitting - Level of Assistance: 7: Independent Dynamic Sitting Balance Dynamic Sitting - Balance Support: No upper extremity supported;Feet supported Dynamic Sitting - Level of Assistance: 7: Independent Dynamic Sitting - Balance Activities: Reaching for objects Static Standing Balance Static Standing - Balance Support: No upper extremity supported Static Standing - Level of Assistance: 6: Modified independent (Device/Increase time) Dynamic Standing Balance Dynamic Standing - Balance Support: No upper extremity supported Dynamic Standing - Level of Assistance: 6: Modified independent (Device/Increase time) Dynamic Standing - Balance Activities: Reaching for objects  Extremity/Trunk Assessment RUE Assessment RUE Assessment: Within Functional Limits LUE Assessment LUE Assessment: Within Functional Limits  See FIM for current functional  status  [name] 05/26/2014, 3:06 PM

## 2014-05-24 NOTE — Discharge Summary (Signed)
Physician Discharge Summary  Patient ID: Jonathan Forbes MRN: [id number] DOB/AGE: 1951-02-04 63 y.o.  Admit date: 05/17/2014 Discharge date: 05/27/2014  Discharge Diagnoses:  Principal Problem:   Right knee DJD Active Problems:   HTN (hypertension)   Postoperative anemia due to acute blood loss   OSA (obstructive sleep apnea)   Discharged Condition: Stable.   Significant Diagnostic Studies:  Labs:  Basic Metabolic Panel:  Recent Labs Lab 05/17/14 2005 05/20/14 0520 05/24/14 0635  NA  --  145  --   K  --  3.7  --   CL  --  104  --   CO2  --  32  --   GLUCOSE  --  111*  --   BUN  --  14  --   CREATININE 1.24 0.82 0.99  CALCIUM  --  8.8  --     CBC:  Recent Labs Lab 05/17/14 2005 05/20/14 0520  WBC 11.3* 8.7  NEUTROABS  --  5.4  HGB 11.8* 11.1*  HCT 36.5* 34.3*  MCV 94.8 93.2  PLT 194 236     Filed Vitals:   05/23/14 0941 05/23/14 1341 05/23/14 1938 05/24/14 0506  BP: 164/93 144/70 143/76 168/85  Pulse: 71 63 63 64  Temp:  98.9 F (37.2 C) 98.1 F (36.7 C) 97.6 F (36.4 C)  TempSrc:  Oral Oral Oral  Resp:  17 18 18   Weight:      SpO2: 93% 97% 96% 93%    Brief HPI:   Jonathan Forbes is a 63 y.o. 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 He was admitted on 05/15/14 for R-TKR and is WBAT RLE. Post op with ABLA as well as reactive leucocytosis and problems with pain control. Therapy initiated and CIR was recommended by therapy team for progressive therapies.   Hospital Course: Jonathan Forbes was admitted to rehab 05/17/2014 for inpatient therapies to consist of PT, ST and OT at least three hours five days a week. Past admission physiatrist, therapy team and rehab RN have worked together to provide customized collaborative inpatient rehab. He was started on OxyContin with improvement in pain management. This was tapered by discharge and patient advised to use oxycodone for severe pain and  tramadol for moderate pain past discharge. Follow up labs show reactive leucocytosis has resolved and lytes shows stable renal status.  He was started on Restoril for acute on chronic insomnia.  Blood pressures have been monitored on tid basis and he has had occasional elevation with  SBP in 160s range. He is to follow up with PMD for titration of medication on outpatient basis. Constipation has resolved with scheduled laxatives. Knee incision has been monitored and is healing well without signs or symptoms of infection. He was maintained on lovenox bid during his hospitalization and was discharged on ASA bid for a month for DVT prophylaxis. He has progressed from min assist with ADLs and mobility to modified independent level. He will continue to receive HHPT by [facility] past discharge. He was discharged to home on 05/24/14.   Rehab course: During patient's stay in rehab weekly team conferences were held to monitor patient's progress, set goals and discuss barriers to discharge. Patient has had improvement in activity tolerance, balance, postural control, as well as ability to compensate for deficits. He is modified independent for bathing and dressing tasks. He is able to transfer and ambulate 150 feet at modified independent level  with use of RW.    Disposition:  Home  Diet: Regular  Special Instructions: 1. Keep wound clean and dry.     Medication List         amLODipine 10 MG tablet  Commonly known as:  NORVASC  Take 10 mg by mouth every morning.     carvedilol 25 MG tablet  Commonly known as:  COREG  Take 25 mg by mouth 2 (two) times daily with a meal.     celecoxib 200 MG capsule  Commonly known as:  CELEBREX  Take 1 capsule (200 mg total) by mouth daily.     enoxaparin 30 MG/0.3ML injection  Commonly known as:  LOVENOX  Inject 0.3 mLs (30 mg total) into the skin every 12 (twelve) hours.     furosemide 40 MG tablet  Commonly known as:  LASIX  Take 40 mg by mouth every  morning.     GRX ANALGESIC BALM Oint  Apply 2 g topically 2 (two) times daily.     lisinopril 40 MG tablet  Commonly known as:  PRINIVIL,ZESTRIL  Take 40 mg by mouth every morning.     methocarbamol 500 MG tablet  Commonly known as:  ROBAXIN  Take 1 tablet (500 mg total) by mouth every 6 (six) hours as needed for muscle spasms.     Oxycodone HCl 10 MG Tabs--Rx 120 pills  Take 0.5-1 tablets (5-10 mg total) by mouth every 4 (four) hours as needed.     polyethylene glycol packet  Commonly known as:  MIRALAX / GLYCOLAX  Take 17 g by mouth 2 (two) times daily.     senna-docusate 8.6-50 MG per tablet  Commonly known as:  SENOKOT S  Take 2 tablets by mouth 2 (two) times daily.     temazepam 15 MG capsule---Rx 30 pills  Commonly known as:  RESTORIL  Take 1 capsule (15 mg total) by mouth at bedtime as needed for sleep.     traMADol 50 MG tablet  Commonly known as:  ULTRAM  Take 1 tablet (50 mg total) by mouth every 6 (six) hours as needed for moderate pain.     VIIBRYD 40 MG Tabs  Generic drug:  Vilazodone HCl  Take 40 mg by mouth daily.       Follow-up Information   Call Meredith Staggers, MD. (As needed)    Specialty:  Physical Medicine and Rehabilitation   Contact information:   [address], [place] [address] [phone]       Follow up with Marianna Payment, MD On 05/28/2014. (for post op check, For wound re-check.)    Specialty:  Orthopedic Surgery   Contact information:   [place] [address] [phone]       Follow up with Omer Jack, MD On 06/03/2014. (@ 9:30 am)    Specialty:  Internal Medicine   Contact information:   [address] [place] [address] [phone]       Signed: Bary Leriche 05/27/2014, 5:42 PM

## 2014-05-24 NOTE — Discharge Instructions (Signed)
Inpatient Rehab Discharge Instructions  Jonathan Forbes Discharge date and time:    Activities/Precautions/ Functional Status: Activity: no lifting, driving, or strenuous exercise till cleared by MD.  Diet: regular diet Wound Care: keep wound clean and dry  Functional status:  ___ No restrictions     ___ Walk up steps independently ___ 24/7 supervision/assistance   ___ Walk up steps with assistance ___ Intermittent supervision/assistance  ___ Bathe/dress independently ___ Walk with walker     ___ Bathe/dress with assistance ___ Walk Independently    ___ Shower independently ___ Walk with assistance    ___ Shower with assistance ___ No alcohol     ___ Return to work/school ________    COMMUNITY REFERRALS UPON DISCHARGE:    Home Health:   PT                         Agency: [place] Phone: [phone]   Medical Equipment/Items Ordered: commode seat                                                     Agency/Supplier: [phone]        Special Instructions:    My questions have been answered and I understand these instructions. I will adhere to these goals and the provided educational materials after my discharge from the hospital.  Patient/Caregiver Signature _______________________________ Date __________  Clinician Signature _______________________________________ Date __________  Please bring this form and your medication list with you to all your follow-up doctor's appointments.

## 2014-06-10 ENCOUNTER — Encounter: Payer: Medicare Other | Attending: Surgery | Admitting: Dietician

## 2014-06-10 VITALS — Ht 71.5 in | Wt 332.0 lb

## 2014-06-10 DIAGNOSIS — Z01818 Encounter for other preprocedural examination: Secondary | ICD-10-CM | POA: Diagnosis present

## 2014-06-10 DIAGNOSIS — I10 Essential (primary) hypertension: Secondary | ICD-10-CM | POA: Insufficient documentation

## 2014-06-10 DIAGNOSIS — Z713 Dietary counseling and surveillance: Secondary | ICD-10-CM | POA: Diagnosis present

## 2014-06-10 DIAGNOSIS — Z96659 Presence of unspecified artificial knee joint: Secondary | ICD-10-CM | POA: Diagnosis not present

## 2014-06-10 DIAGNOSIS — E669 Obesity, unspecified: Secondary | ICD-10-CM

## 2014-06-10 DIAGNOSIS — M1710 Unilateral primary osteoarthritis, unspecified knee: Secondary | ICD-10-CM | POA: Diagnosis not present

## 2014-06-10 DIAGNOSIS — G4733 Obstructive sleep apnea (adult) (pediatric): Secondary | ICD-10-CM | POA: Insufficient documentation

## 2014-06-10 DIAGNOSIS — Z6841 Body Mass Index (BMI) 40.0 and over, adult: Secondary | ICD-10-CM | POA: Diagnosis not present

## 2014-06-10 NOTE — Progress Notes (Signed)
  Pre-Op Assessment Visit:  Pre-Operative Sleeve Gastrectomy Surgery  Medical Nutrition Therapy:  Appt start time: 0800   End time:  0840.  Patient was seen on 06/10/2014 for Pre-Operative RYGB Nutrition Assessment. Assessment and letter of approval faxed to [facility] Surgery Bariatric Surgery Program coordinator on 06/10/2014.   Preferred Learning Style:   No preference indicated   Learning Readiness:   Ready  Handouts given during visit include:  Pre-Op Goals Bariatric Surgery Protein Shakes  Teaching Method Utilized:  Visual Auditory Hands on  Barriers to learning/adherence to lifestyle change: limited mobility d/t recent knee surgery  Demonstrated degree of understanding via:  Teach Back   [facility] to [facility] to enroll in Pre-Op and Post-Op Nutrition Education when surgery date is scheduled.

## 2014-06-10 NOTE — Patient Instructions (Signed)
Follow Pre-Op Goals Try Protein Shakes Return for 6 Supervised Weight Loss Visits Call [facility] at [phone] when surgery is scheduled to enroll in Pre-Op Class

## 2014-06-12 ENCOUNTER — Encounter (HOSPITAL_BASED_OUTPATIENT_CLINIC_OR_DEPARTMENT_OTHER): Payer: Medicare Other

## 2014-06-12 ENCOUNTER — Institutional Professional Consult (permissible substitution): Payer: Medicare Other | Admitting: Pulmonary Disease

## 2014-06-19 ENCOUNTER — Institutional Professional Consult (permissible substitution): Payer: Medicare Other | Admitting: Pulmonary Disease

## 2014-06-25 ENCOUNTER — Encounter: Payer: Medicare Other | Attending: Surgery | Admitting: Dietician

## 2014-06-25 DIAGNOSIS — Z713 Dietary counseling and surveillance: Secondary | ICD-10-CM | POA: Diagnosis present

## 2014-06-25 DIAGNOSIS — M1710 Unilateral primary osteoarthritis, unspecified knee: Secondary | ICD-10-CM | POA: Diagnosis not present

## 2014-06-25 DIAGNOSIS — Z6841 Body Mass Index (BMI) 40.0 and over, adult: Secondary | ICD-10-CM | POA: Insufficient documentation

## 2014-06-25 DIAGNOSIS — G4733 Obstructive sleep apnea (adult) (pediatric): Secondary | ICD-10-CM | POA: Insufficient documentation

## 2014-06-25 DIAGNOSIS — I10 Essential (primary) hypertension: Secondary | ICD-10-CM | POA: Insufficient documentation

## 2014-06-25 DIAGNOSIS — Z96659 Presence of unspecified artificial knee joint: Secondary | ICD-10-CM | POA: Insufficient documentation

## 2014-06-25 DIAGNOSIS — Z01818 Encounter for other preprocedural examination: Secondary | ICD-10-CM | POA: Diagnosis present

## 2014-06-25 NOTE — Progress Notes (Signed)
  6 Months Supervised Weight Loss Visit:   Pre-Operative Sleeve Surgery  Medical Nutrition Therapy:  Appt start time: 6568 end time:  1430.  Primary concerns today: Supervised Weight Loss Visit #1 of 6 visits. Ryman returns with a 2 lb weight loss. Got a cut on the bottom on his foot 2 weeks again which is making getting around difficult. Having trouble getting the the grocery store. Hasn't been able to follow a low calorie diet. Has been overeating sometimes especially at night.   Has been working on portion control and trying to chew well. Started to become more aware/mindful of portion sizes.   Weight: 330.2 lbs BMI: 45.4  24-hr recall: B (AM): protein shake and hamburger meat with vegetables Snk (AM): none L (PM): 2 slices of pizza, soup Snk (PM): none D (PM): spaghetti with broccoli and hamburger meat Snk (PM): cheese and crackers or chips  Beverages: drinking water and Diet Coke or crystal  Medications: see list  Recent physical activity:  None   Progress Towards Goal(s):  In progress.  Nutritional Diagnosis:  [place]-3.3 Overweight/obesity As related to hx of poor food choices .  As evidenced by BMI of 45.4.    Intervention:  Nutrition counseling provided. Plan: As you become more mobile, plan to start walking. Continue choosing calorie free beverages. Try to increase water intake. Continue chewing well and being aware of your hunger/fullness cues.  Eat evening snack at table with no TV.  Increase fruits and vegetables when you can.   Monitoring/Evaluation:  Dietary intake, exercise, and body weight. Follow up in 1 months for 6 month supervised weight loss visit.

## 2014-06-25 NOTE — Patient Instructions (Addendum)
As you become more mobile, plan to start walking. Continue choosing calorie free beverages. Try to increase water intake. Continue chewing well and being aware of your hunger/fullness cues.  Eat evening snack at table with no TV.  Increase fruits and vegetables when you can.

## 2014-07-25 ENCOUNTER — Encounter: Payer: Medicare Other | Attending: Surgery | Admitting: Dietician

## 2014-07-25 DIAGNOSIS — Z6841 Body Mass Index (BMI) 40.0 and over, adult: Secondary | ICD-10-CM | POA: Insufficient documentation

## 2014-07-25 DIAGNOSIS — Z713 Dietary counseling and surveillance: Secondary | ICD-10-CM | POA: Insufficient documentation

## 2014-07-25 DIAGNOSIS — Z01818 Encounter for other preprocedural examination: Secondary | ICD-10-CM | POA: Diagnosis not present

## 2014-07-25 NOTE — Progress Notes (Signed)
  6 Months Supervised Weight Loss Visit:   Pre-Operative Sleeve Surgery  Medical Nutrition Therapy:  Appt start time: 1200 end time:  1215.  Primary concerns today: Supervised Weight Loss Visit #2 of 6 visits. Ether returns with a 2 lb weight gain. Has been having a lot of stress and binged a bit on ice cream.Trying to not watch TV and eat at the same time, trying to chew more than before, drinking more water, drinking less carbonated water, and eating more protein.  Knees are hurting him a lot d/t the weather. Cut on the bottom on his foot is healing. Blood pressure is fluctuating a lot.   Weight: 332.1 lbs BMI: 45.4  24-hr recall: B (AM): protein shake and hamburger meat with vegetables Snk (AM): none L (PM): 2 slices of pizza, soup Snk (PM): none D (PM): spaghetti with broccoli and hamburger meat Snk (PM): cheese and crackers or chips  Beverages: drinking water and Diet Coke or crystal  Medications: see list  Recent physical activity:  None   Progress Towards Goal(s):  In progress.  Nutritional Diagnosis:  [place]-3.3 Overweight/obesity As related to hx of poor food choices .  As evidenced by BMI of 45.4.    Intervention:  Nutrition counseling provided. Plan: As you start to feel better, plan to start walking. Continue choosing calorie free beverages. Try to increase water intake. Continue chewing well and being aware of your hunger/fullness cues.  Eat evening snack at table with no TV.  Increase vegetables when you can.  Consider talking to a counselor about binge eating. Try to not have tempting foods at home (ice cream). Try protein shakes  Handouts given out during appointment: Counseling services in the area   Monitoring/Evaluation:  Dietary intake, exercise, and body weight. Follow up in 1 months for 6 month supervised weight loss visit.

## 2014-07-25 NOTE — Patient Instructions (Addendum)
As you start to feel better, plan to start walking. Continue choosing calorie free beverages. Try to increase water intake. Continue chewing well and being aware of your hunger/fullness cues.  Eat evening snack at table with no TV.  Increase vegetables when you can.  Consider talking to a counselor about binge eating. Try to not have tempting foods at home (ice cream). Try protein shakes.

## 2014-09-02 ENCOUNTER — Encounter: Payer: Medicare Other | Attending: Surgery | Admitting: Dietician

## 2014-09-02 DIAGNOSIS — Z713 Dietary counseling and surveillance: Secondary | ICD-10-CM | POA: Insufficient documentation

## 2014-09-02 DIAGNOSIS — Z6841 Body Mass Index (BMI) 40.0 and over, adult: Secondary | ICD-10-CM | POA: Diagnosis not present

## 2014-09-02 DIAGNOSIS — Z01818 Encounter for other preprocedural examination: Secondary | ICD-10-CM | POA: Insufficient documentation

## 2014-09-02 NOTE — Patient Instructions (Addendum)
As you start to feel better, plan to start walking. Try to increase water intake. Start working on [employer] out carbonation and caffeine (coffee and soda). Decaf is ok.  Continue chewing well and being aware of your hunger/fullness cues.  Consider talking to a counselor about binge eating. Make appointment with Dr. Ardath Sax for psych evaluation. Try to not have tempting foods at home (triscuits). Try protein shakes.

## 2014-09-02 NOTE — Progress Notes (Signed)
  6 Months Supervised Weight Loss Visit:   Pre-Operative Sleeve Surgery  Medical Nutrition Therapy:  Appt start time: 1200 end time:  1215.  Primary concerns today: Supervised Weight Loss Visit #3 of 6 visits. Geral returns with a 1 lb weight loss. Recently was on prednisone (about 5 days ago) which caused him to gain some weight. Has been trying to not watch TV while he eats, eating less at night, trying to not "gulp food, watching carbohydrates and eating protein. Eating smaller portions.  Foot is still healing so unable to walk. Knees are also hurting.  Ate a lot of sandwiches one night and triscuits. Feels like he shouldn't buy them.  Weight: 332.1 lbs BMI: 45.4  24-hr recall: B (AM): protein shake and hamburger meat with vegetables Snk (AM): none L (PM): 2 slices of pizza, soup Snk (PM): none D (PM): spaghetti with broccoli and hamburger meat Snk (PM): cheese and crackers or chips  Beverages: drinking water and Diet Coke or crystal light  Medications: see list  Recent physical activity:  None   Progress Towards Goal(s):  In progress.  Nutritional Diagnosis:  [place]-3.3 Overweight/obesity As related to hx of poor food choices .  As evidenced by BMI of 45.4.    Intervention:  Nutrition counseling provided. Plan: As you start to feel better, plan to start walking. Try to increase water intake. Start working on [employer] out carbonation and caffeine (coffee and soda). Decaf is ok.  Continue chewing well and being aware of your hunger/fullness cues.  Consider talking to a counselor about binge eating. Make appointment with Dr. Ardath Sax for psych evaluation. Try to not have tempting foods at home (triscuits). Try protein shakes.    Monitoring/Evaluation:  Dietary intake, exercise, and body weight. Follow up in 1 months for 6 month supervised weight loss visit.

## 2014-09-25 ENCOUNTER — Ambulatory Visit: Payer: Medicare Other | Admitting: Dietician

## 2014-09-26 ENCOUNTER — Encounter: Payer: Medicare Other | Attending: Surgery | Admitting: Dietician

## 2014-09-26 DIAGNOSIS — Z01818 Encounter for other preprocedural examination: Secondary | ICD-10-CM | POA: Diagnosis present

## 2014-09-26 DIAGNOSIS — Z6841 Body Mass Index (BMI) 40.0 and over, adult: Secondary | ICD-10-CM | POA: Diagnosis not present

## 2014-09-26 DIAGNOSIS — Z713 Dietary counseling and surveillance: Secondary | ICD-10-CM | POA: Diagnosis not present

## 2014-09-26 NOTE — Progress Notes (Signed)
  6 Months Supervised Weight Loss Visit:   Pre-Operative Sleeve Surgery  Medical Nutrition Therapy:  Appt start time: 400 end time:  415.  Primary concerns today: Supervised Weight Loss Visit #4 of 6 visits. Arvis returns with a 7 lb weight gain. Has been waking up in the middle of the night and eating "anything". Has been doing stress eating this month. Sees a therapist once per week to help with binge eating.   Just had oral surgery today.   Has not been eating very many sweets. Portion sizes are not that bad, but will go back for seconds.   Feels like the upcoming month will be less stressful. Able to walk more than he used to.   Weight: 338.1 lbs BMI: 46.8  24-hr recall: B (AM): protein shake and hamburger meat with vegetables Snk (AM): none L (PM): 2 slices of pizza, soup Snk (PM): none D (PM): spaghetti with broccoli and hamburger meat Snk (PM): cheese and crackers or chips  Beverages: drinking water and Diet Coke or crystal light  Medications: see list  Recent physical activity:  None   Progress Towards Goal(s):  In progress.  Nutritional Diagnosis:  [place]-3.3 Overweight/obesity As related to hx of poor food choices .  As evidenced by BMI of 45.4.    Intervention:  Nutrition counseling provided.  Plan: Plan to walk around the block or join the gym as tolerated. Start working on [employer] out caffeine (coffee). Decaf is ok.  Continue chewing well 20-30 x per bite. Try to take 20 minutes to eat. Put your fork down.  Pay attention to hunger cues and eat when hungry. Try to not have tempting foods at home (triscuits). Put salad dressing on the side.  Continue talking to your therapist about binge eating.   Try drinking a large glass of water in the middle of the night instead of eating.    Monitoring/Evaluation:  Dietary intake, exercise, and body weight. Follow up in 1 months for 6 month supervised weight loss visit.

## 2014-09-26 NOTE — Patient Instructions (Addendum)
Plan to walk around the block or join the gym as tolerated. Start working on [employer] out caffeine (coffee). Decaf is ok.  Continue chewing well 20-30 x per bite. Try to take 20 minutes to eat. Put your fork down.  Pay attention to hunger cues and eat when hungry. Try to not have tempting foods at home (triscuits). Put salad dressing on the side.  Continue talking to your therapist about binge eating.   Try drinking a large glass of water in the middle of the night instead of eating.

## 2014-10-28 ENCOUNTER — Encounter: Payer: Medicare Other | Attending: Surgery | Admitting: Dietician

## 2014-10-28 DIAGNOSIS — Z713 Dietary counseling and surveillance: Secondary | ICD-10-CM | POA: Diagnosis not present

## 2014-10-28 DIAGNOSIS — Z6841 Body Mass Index (BMI) 40.0 and over, adult: Secondary | ICD-10-CM | POA: Insufficient documentation

## 2014-10-28 DIAGNOSIS — Z01818 Encounter for other preprocedural examination: Secondary | ICD-10-CM | POA: Insufficient documentation

## 2014-10-28 NOTE — Progress Notes (Signed)
  6 Months Supervised Weight Loss Visit:   Pre-Operative Sleeve Surgery  Medical Nutrition Therapy:  Appt start time: 1200 end time:  1215.  Primary concerns today: Supervised Weight Loss Visit #5 of 6 visits. Jonathan Forbes returns with a 5 lb weight loss. Stress level has improved since last time.   Planning to join a gym to improve muscles around knee.  Has not been binging this month, though has been eating a lot salad.  Weight: 333.7 lbs BMI: 45.9  24-hr recall: B (AM): protein shake and hamburger meat with vegetables Snk (AM): none L (PM): 2 slices of pizza, soup Snk (PM): none D (PM): spaghetti with broccoli and hamburger meat Snk (PM): cheese and crackers or chips  Beverages: drinking water and Diet Coke or crystal light  Medications: see list  Recent physical activity:  None   Progress Towards Goal(s):  In progress.  Nutritional Diagnosis:  [place]-3.3 Overweight/obesity As related to hx of poor food choices .  As evidenced by BMI of 45.4.    Intervention:  Nutrition counseling provided.  Plan: Plan to join the gym and use the bike. Phase out caffeine (coffee). Decaf is ok.  Continue chewing well 20-30 x per bite. Try to take 20 minutes to eat. Put your fork down. Try to eat meals away from the TV.  Eat meals sitting up.   Try to not have tempting foods at home (triscuits). Continue drinking a large glass of water in the middle of the night instead of eating.  Work on finding a protein shake you like. Call [facility] when surgery is scheduled in order to enroll in Pre-Op Class.    Monitoring/Evaluation:  Dietary intake, exercise, and body weight. Follow up in 1 months for 6 month supervised weight loss visit.

## 2014-10-28 NOTE — Patient Instructions (Addendum)
Plan to join the gym and use the bike. Phase out caffeine (coffee). Decaf is ok.  Continue chewing well 20-30 x per bite. Try to take 20 minutes to eat. Put your fork down. Try to eat meals away from the TV.  Eat meals sitting up.   Try to not have tempting foods at home (triscuits). Continue drinking a large glass of water in the middle of the night instead of eating.  Work on finding a protein shake you like. Call [facility] when surgery is scheduled in order to enroll in Pre-Op Class.

## 2014-11-21 ENCOUNTER — Ambulatory Visit: Payer: Medicare Other | Attending: Orthopaedic Surgery

## 2014-11-21 DIAGNOSIS — M25562 Pain in left knee: Secondary | ICD-10-CM | POA: Diagnosis not present

## 2014-11-21 DIAGNOSIS — R262 Difficulty in walking, not elsewhere classified: Secondary | ICD-10-CM | POA: Insufficient documentation

## 2014-11-21 DIAGNOSIS — Z96659 Presence of unspecified artificial knee joint: Secondary | ICD-10-CM | POA: Diagnosis not present

## 2014-11-21 DIAGNOSIS — M25561 Pain in right knee: Secondary | ICD-10-CM | POA: Diagnosis present

## 2014-11-25 ENCOUNTER — Encounter: Payer: Medicare Other | Attending: Surgery | Admitting: Dietician

## 2014-11-25 DIAGNOSIS — Z6841 Body Mass Index (BMI) 40.0 and over, adult: Secondary | ICD-10-CM | POA: Insufficient documentation

## 2014-11-25 DIAGNOSIS — Z713 Dietary counseling and surveillance: Secondary | ICD-10-CM | POA: Insufficient documentation

## 2014-11-25 DIAGNOSIS — Z01818 Encounter for other preprocedural examination: Secondary | ICD-10-CM | POA: Insufficient documentation

## 2014-11-25 NOTE — Progress Notes (Signed)
  6 Months Supervised Weight Loss Visit:   Pre-Operative Sleeve Surgery  Medical Nutrition Therapy:  Appt start time: 1200 end time:  1215.  Primary concerns today: Supervised Weight Loss Visit #6 of 6 visits. Jonathan Forbes returns with a 1 lb weight gain. Being treated for knee and feet pain. May need to have surgery. Joined a gym and not going very often yet. Has tried protein shakes.   Has not been binging this month, though had some potato chips last night.   Weight: 334.7 lbs BMI: 45.9  24-hr recall: B (AM): protein shake and hamburger meat with vegetables Snk (AM): none L (PM): 2 slices of pizza, soup Snk (PM): none D (PM): spaghetti with broccoli and hamburger meat Snk (PM): cheese and crackers or chips  Beverages: drinking water and Diet Coke or crystal light  Medications: see list  Recent physical activity:  Joined a gym and has gone a few times, going to rehab for knees  Progress Towards Goal(s):  In progress.  Nutritional Diagnosis:  Jonathan Forbes-3.3 Overweight/obesity As related to hx of poor food choices .  As evidenced by BMI of 45.4.    Intervention:  Nutrition counseling provided.  Plan: Plan to go the gym a few times per week.  Phase out caffeine (coffee). Decaf is ok.  Continue chewing well 20-30 x per bite. Try to take 20 minutes to eat. Put your fork down. Try to eat meals away from the TV. Turn it off. Continue to eat meals sitting up.   Try to not have tempting foods at home (chips). Continue drinking a large glass of water in the middle of the night instead of eating.  Work on finding a protein shake you like (less than 5 g of carbs). Call [facility] [phone] when surgery is scheduled in order to enroll in Pre-Op Class.    Monitoring/Evaluation:  Dietary intake, exercise, and body weight. Follow up in 1 months for 6 month supervised weight loss visit.

## 2014-11-25 NOTE — Patient Instructions (Addendum)
Plan to go the gym a few times per week.  Phase out caffeine (coffee). Decaf is ok.  Continue chewing well 20-30 x per bite. Try to take 20 minutes to eat. Put your fork down. Try to eat meals away from the TV. Turn it off. Continue to eat meals sitting up.   Try to not have tempting foods at home (chips). Continue drinking a large glass of water in the middle of the night instead of eating.  Work on finding a protein shake you like (less than 5 g of carbs). Call [facility] [phone] when surgery is scheduled in order to enroll in Pre-Op Class.

## 2014-11-27 ENCOUNTER — Ambulatory Visit: Payer: Medicare Other | Attending: Orthopaedic Surgery

## 2014-11-27 DIAGNOSIS — Z96659 Presence of unspecified artificial knee joint: Secondary | ICD-10-CM | POA: Insufficient documentation

## 2014-11-27 DIAGNOSIS — M25561 Pain in right knee: Secondary | ICD-10-CM | POA: Insufficient documentation

## 2014-11-27 DIAGNOSIS — M25562 Pain in left knee: Secondary | ICD-10-CM | POA: Insufficient documentation

## 2014-11-27 DIAGNOSIS — R262 Difficulty in walking, not elsewhere classified: Secondary | ICD-10-CM | POA: Insufficient documentation

## 2014-11-28 ENCOUNTER — Ambulatory Visit (HOSPITAL_BASED_OUTPATIENT_CLINIC_OR_DEPARTMENT_OTHER): Payer: Medicare Other | Attending: Surgery | Admitting: *Deleted

## 2014-11-28 VITALS — Ht 71.0 in | Wt 330.0 lb

## 2014-11-28 DIAGNOSIS — R5381 Other malaise: Secondary | ICD-10-CM | POA: Insufficient documentation

## 2014-11-28 DIAGNOSIS — R5383 Other fatigue: Secondary | ICD-10-CM | POA: Insufficient documentation

## 2014-11-28 DIAGNOSIS — Z01818 Encounter for other preprocedural examination: Secondary | ICD-10-CM | POA: Insufficient documentation

## 2014-11-28 DIAGNOSIS — I10 Essential (primary) hypertension: Secondary | ICD-10-CM

## 2014-11-28 DIAGNOSIS — Z6841 Body Mass Index (BMI) 40.0 and over, adult: Secondary | ICD-10-CM | POA: Diagnosis not present

## 2014-11-28 DIAGNOSIS — M1990 Unspecified osteoarthritis, unspecified site: Secondary | ICD-10-CM | POA: Insufficient documentation

## 2014-11-28 DIAGNOSIS — G4733 Obstructive sleep apnea (adult) (pediatric): Secondary | ICD-10-CM | POA: Insufficient documentation

## 2014-11-29 ENCOUNTER — Ambulatory Visit: Payer: Medicare Other | Admitting: Rehabilitation

## 2014-11-29 DIAGNOSIS — M25562 Pain in left knee: Secondary | ICD-10-CM | POA: Diagnosis not present

## 2014-11-29 DIAGNOSIS — R262 Difficulty in walking, not elsewhere classified: Secondary | ICD-10-CM | POA: Diagnosis not present

## 2014-11-29 DIAGNOSIS — Z96659 Presence of unspecified artificial knee joint: Secondary | ICD-10-CM | POA: Diagnosis not present

## 2014-11-29 DIAGNOSIS — M25561 Pain in right knee: Secondary | ICD-10-CM | POA: Diagnosis present

## 2014-12-04 ENCOUNTER — Ambulatory Visit: Payer: Medicare Other

## 2014-12-04 DIAGNOSIS — M25562 Pain in left knee: Secondary | ICD-10-CM | POA: Diagnosis not present

## 2014-12-07 ENCOUNTER — Ambulatory Visit (HOSPITAL_BASED_OUTPATIENT_CLINIC_OR_DEPARTMENT_OTHER): Payer: Medicare Other | Admitting: Internal Medicine

## 2014-12-07 DIAGNOSIS — R5381 Other malaise: Secondary | ICD-10-CM

## 2014-12-07 DIAGNOSIS — G4733 Obstructive sleep apnea (adult) (pediatric): Secondary | ICD-10-CM

## 2014-12-07 DIAGNOSIS — I10 Essential (primary) hypertension: Secondary | ICD-10-CM

## 2014-12-07 DIAGNOSIS — M1990 Unspecified osteoarthritis, unspecified site: Secondary | ICD-10-CM

## 2014-12-07 NOTE — Sleep Study (Signed)
   NAME: Jonathan Forbes DATE OF BIRTH:  [birth year] MEDICAL RECORD NUMBER [id number]  LOCATION: [facility]  PHYSICIAN: Elyssa Pendelton D  DATE OF STUDY: 11/28/2014  SLEEP STUDY TYPE: Nocturnal Polysomnogram               REFERRING PHYSICIAN: Pedro Earls, MD  INDICATION FOR STUDY: Hypersomnia with sleep apnea  EPWORTH SLEEPINESS SCORE:   4/24 HEIGHT: 5\' 11"  (180.3 cm)  WEIGHT: (!) 330 lb (149.687 kg)    Body mass index is 46.05 kg/(m^2).  NECK SIZE: 21 in.  MEDICATIONS: Charted for review  SLEEP ARCHITECTURE: Total sleep time 330.5 minutes with sleep efficiency 82.2%. Stage I was 9.5%, stage II 90.5%, stages 3 and REM were absent. Sleep latency 41 minutes, awake after sleep onset 30 minutes, arousal index 5.6, bedtime medication trazodone  RESPIRATORY DATA: Apnea hypopnea index (AHI) 14.0 per hour. 77 total events scored including 6 obstructive apneas and 71 hypopneas. Most events were while sleeping nonsupine. There were not enough respiratory events by the 2 AM cut off time to permit application of split protocol CPAP titration  OXYGEN DATA: Moderate to loud snoring with oxygen desaturation to a nadir of 80% and mean saturation 86.7% on room air.  CARDIAC DATA: Sinus rhythm with frequent PVCs  MOVEMENT/PARASOMNIA: No significant movement disturbance, bathroom 2  IMPRESSION/ RECOMMENDATION:   1) Mild obstructive sleep apnea/hypopnea syndrome, AHI 14.0 per hour with mostly nonsupine events. Moderate to loud snoring with oxygen desaturation to a nadir of 80% and mean saturation 86.7% on room air. 2) There were not enough early events to permit application of split protocol CPAP titration on this study. The patient can return for a dedicated CPAP titration study if appropriate.   Deneise Lever Diplomate, American Board of Sleep Medicine  ELECTRONICALLY SIGNED ON:  12/07/2014, 12:21 PM [place] PH: [phone]   FX: [phone] [place]

## 2014-12-11 ENCOUNTER — Ambulatory Visit: Payer: Medicare Other | Admitting: Rehabilitation

## 2014-12-11 DIAGNOSIS — M25562 Pain in left knee: Secondary | ICD-10-CM | POA: Diagnosis not present

## 2014-12-11 DIAGNOSIS — R262 Difficulty in walking, not elsewhere classified: Secondary | ICD-10-CM

## 2014-12-11 DIAGNOSIS — M25561 Pain in right knee: Secondary | ICD-10-CM

## 2014-12-11 NOTE — Therapy (Signed)
[facility] [address]  [place], [place], [address] Phone: [phone]   Fax:  [phone]  Physical Therapy Treatment  Patient Details  Name: Jonathan Forbes MRN: [id number] Date of Birth: 12-30-1950 Referring Provider:  Marianna Payment, MD  Encounter Date: 12/11/2014      PT End of Session - 12/11/14 1352    Visit Number 5   Number of Visits 12   Date for PT Re-Evaluation 01/03/15   PT Start Time 6789   PT Stop Time 1410   PT Time Calculation (min) 58 min   Activity Tolerance Patient tolerated treatment well   Behavior During Therapy [facility] for tasks assessed/performed      Past Medical History  Diagnosis Date  . Hypertension   . Depression   . Glaucoma     Hx; of beginning stages  . Eczema     Hx; of  . Anxiety   . Mitral valve regurgitation     Hx; of slight  . Heart murmur   . OSA (obstructive sleep apnea)     not using CPAP at present, will have a sleep study in August  . OA (osteoarthritis) of knee     "both" (05/16/2014)  . Skin cancer 08/2013    "back"    Past Surgical History  Procedure Laterality Date  . Tumor excision      skin cancer of right side of back  . Cataract extraction w/ intraocular lens  implant, bilateral Bilateral   . Colonoscopy      Hx; of  . Tonsillectomy    . Total knee arthroplasty Left 12/05/2013    Procedure: LEFT TOTAL KNEE ARTHROPLASTY;  Surgeon: Marianna Payment, MD;  Location: [place];  Service: Orthopedics;  Laterality: Left;  Marland Kitchen Eye surgery    . Joint replacement    . Breath tek h pylori N/A 05/14/2014    Procedure: BREATH TEK H PYLORI;  Surgeon: Pedro Earls, MD;  Location: [facility];  Service: General;  Laterality: N/A;  . Total knee arthroplasty Right 05/15/2014    Procedure: RIGHT TOTAL KNEE ARTHROPLASTY;  Surgeon: Marianna Payment, MD;  Location: [place];  Service: Orthopedics;  Laterality: Right;  . Skin cancer excision  08/2013    "off my  back; not melanoma"    There were no vitals taken for this visit.  Visit Diagnosis:  Knee pain, bilateral  Difficulty walking      Subjective Assessment - 12/11/14 1325    Symptoms bilateral knee pain   Limitations Sitting;Walking;Standing   How long can you sit comfortably? Patient unable to give a time   How long can you stand comfortably? Patient unable to give a time   How long can you walk comfortably? Patient unable to give a time but stated he did walk into [employer] over the weekend   Currently in Pain? Yes   Pain Score 5    Pain Location Knee   Pain Orientation Right;Left   Pain Onset More than a month ago   Pain Frequency Intermittent   Aggravating Factors  Sit to stand, prolonged sitting, lying, standing, walking.           [facility] PT Assessment - 12/11/14 1338    Strength   Right Knee Flexion 4/5  Pain   Right Knee Extension 4+/5   Left Knee Flexion 4+/5   Left Knee Extension 5/5  [facility] Adult PT Treatment/Exercise - 12/11/14 0001    Exercises   Exercises Knee/Hip;Ankle   Knee/Hip Exercises: Stretches   Active Hamstring Stretch 3 reps;20 seconds  with strap   Quad Stretch 2 reps;20 seconds  Mod Thomas   Gastroc Stretch 3 reps;20 seconds  bilateral   Knee/Hip Exercises: Aerobic   Stationary Bike level 2 x 6 minutes   Knee/Hip Exercises: Standing   Forward Lunges 10 reps;Both  onto BOSU   Hip ADduction 1 set;10 reps  4#   Knee/Hip Exercises: Seated   Long Arc Quad 10 reps;2 sets  with hip adduction squeeze   Long Arc Quad Weight --  4#   Knee/Hip Exercises: Supine   Bridges 10 reps  5 second hold with adduction squeeze   Straight Leg Raises AROM;10 reps;2 sets   Modalities   Modalities Cryotherapy   Cryotherapy   Number Minutes Cryotherapy 12 Minutes   Cryotherapy Location Knee  bilateral   Type of Cryotherapy Ice pack                  PT Short Term Goals - 12/11/14 1323    PT SHORT TERM GOAL #1    Title be indepenent with initial HEP for functional strength and ROM. 12/13/14   Time 4   Period Weeks   Status New   PT SHORT TERM GOAL #2   Title report tolerating gym activities without increased pain. 12/13/14   Time 4   Period Weeks   Status New   PT SHORT TERM GOAL #3   Title demonstrate right SLR without quad lag. 12/13/14   Time 4   Period Weeks   Status Achieved           PT Long Term Goals - 12/11/14 1316    PT LONG TERM GOAL #1   Title Demonstrate and/or verbalize technique to reduce the risk of re-injury to include info on physical activity. 01/03/15   Time 6   Period Weeks   Status New   PT LONG TERM GOAL #2   Title be independent with advanced HEP and gym activities for balance, strength, and conditioning. 01/03/15   Time 6   Period Weeks   Status New   PT LONG TERM GOAL #3   Title report pain decrease to no greater than 5/10 at worst. 01/03/15   Time 6   Period Weeks   Status New   PT LONG TERM GOAL #4   Title report 25% improvement in sleep. 01/03/15   Time 6   Period Weeks   Status New   PT LONG TERM GOAL #5   Title report able to ambulate at least 15 minutes with pain no more then 5/10. 01/03/15   Time 6   Period Weeks   Status New   Additional Long Term Goals   Additional Long Term Goals Yes   PT LONG TERM GOAL #6   Title functional reporting on FOTO with no greater than 47% limitation. 01/03/15   Time 6   Period Weeks   Status New               Plan - 12/11/14 1353    Clinical Impression Statement Patient tolerated treatment well, had difficulty changing from knee flexion to extension (pain)   Pt will benefit from skilled therapeutic intervention in order to improve on the following deficits Decreased endurance;Decreased strength;Decreased mobility;Decreased activity tolerance   Rehab Potential Good   Clinical Impairments Affecting Rehab Potential Pain  PT Frequency 2x / week   PT Duration 6 weeks   PT Treatment/Interventions  Ultrasound;Electrical Stimulation;Cryotherapy;Moist Heat;Manual techniques;Therapeutic exercise;Therapeutic activities;Patient/family education;Gait training;Neuromuscular re-education   Consulted and Agree with Plan of Care Patient        Problem List Patient Active Problem List   Diagnosis Date Noted  . 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 Osteoarthritis of right knee 05/15/2014  . OSA (obstructive sleep apnea) 04/22/2014  . HTN (hypertension) 12/14/2013  . Meralgia paraesthetica 12/14/2013  . Postoperative anemia due to acute blood loss 12/14/2013  . OA (osteoarthritis) of knee 12/11/2013  . Osteoarthritis of left knee 12/05/2013  . Total knee replacement status 12/05/2013    Barbette Hair PTA 12/11/2014, 2:07 PM  [facility] [address]  [place], [place], [address] Phone: [phone]   Fax:  [phone]

## 2014-12-13 ENCOUNTER — Ambulatory Visit: Payer: Medicare Other | Admitting: Rehabilitation

## 2014-12-13 DIAGNOSIS — M25562 Pain in left knee: Principal | ICD-10-CM

## 2014-12-13 DIAGNOSIS — M25561 Pain in right knee: Secondary | ICD-10-CM

## 2014-12-13 NOTE — Therapy (Signed)
[facility] [address]  [place], [place], [address] Phone: [phone]   Fax:  [phone]  Physical Therapy Treatment  Patient Details  Name: Jonathan Forbes MRN: [id number] Date of Birth: 1951-04-17 Referring Provider:  Marianna Payment, MD  Encounter Date: 12/13/2014      PT End of Session - 12/13/14 1018    Visit Number 6   Number of Visits 12   Date for PT Re-Evaluation 01/03/15   PT Start Time 0939   PT Stop Time 1029   PT Time Calculation (min) 50 min   Activity Tolerance Patient tolerated treatment well   Behavior During Therapy [facility] for tasks assessed/performed      Past Medical History  Diagnosis Date  . Hypertension   . Depression   . Glaucoma     Hx; of beginning stages  . Eczema     Hx; of  . Anxiety   . Mitral valve regurgitation     Hx; of slight  . Heart murmur   . OSA (obstructive sleep apnea)     not using CPAP at present, will have a sleep study in August  . OA (osteoarthritis) of knee     "both" (05/16/2014)  . Skin cancer 08/2013    "back"    Past Surgical History  Procedure Laterality Date  . Tumor excision      skin cancer of right side of back  . Cataract extraction w/ intraocular lens  implant, bilateral Bilateral   . Colonoscopy      Hx; of  . Tonsillectomy    . Total knee arthroplasty Left 12/05/2013    Procedure: LEFT TOTAL KNEE ARTHROPLASTY;  Surgeon: Marianna Payment, MD;  Location: [place];  Service: Orthopedics;  Laterality: Left;  Marland Kitchen Eye surgery    . Joint replacement    . Breath tek h pylori N/A 05/14/2014    Procedure: BREATH TEK H PYLORI;  Surgeon: Pedro Earls, MD;  Location: [facility];  Service: General;  Laterality: N/A;  . Total knee arthroplasty Right 05/15/2014    Procedure: RIGHT TOTAL KNEE ARTHROPLASTY;  Surgeon: Marianna Payment, MD;  Location: [place];  Service: Orthopedics;  Laterality: Right;  . Skin cancer excision  08/2013    "off my  back; not melanoma"    There were no vitals taken for this visit.  Visit Diagnosis:  Knee pain, bilateral      Subjective Assessment - 12/13/14 0941    Symptoms Notes knees are stiff this morning. Also stated that his knees were a little sore after last time.    Currently in Pain? Yes   Pain Score 6    Pain Location Knee   Pain Orientation Right;Left   Pain Descriptors / Indicators --  Stiff                    [facility] Adult PT Treatment/Exercise - 12/13/14 0001    Knee/Hip Exercises: Stretches   Active Hamstring Stretch 2 reps;20 seconds  seated   Gastroc Stretch 20 seconds;2 reps   Knee/Hip Exercises: Aerobic   Stationary Bike level 2 x 6 minutes   Knee/Hip Exercises: Standing   Heel Raises 10 reps  onto blue foam   Knee Flexion Both;1 set;10 reps;Other (comment)  4#   Forward Lunges 10 reps;Both  onto BOSU with chair assist   Hip ADduction 1 set;10 reps  Alt, 4#   Forward Step Up Both;10 reps;Hand Hold:  1  5" hold onto blue foam   Wall Squat 10 reps;Other (comment)  with green pball   Other Standing Knee Exercises Marching x 10, 4#   Knee/Hip Exercises: Seated   Long Arc Quad 10 reps;2 sets  with hip adduction squeeze   Long Arc Quad Weight --  4#   Knee/Hip Exercises: Supine   Bridges 2 sets;10 reps  with hip adduction squeeze   Modalities   Modalities Cryotherapy   Cryotherapy   Number Minutes Cryotherapy 12 Minutes   Cryotherapy Location Knee  bilateral   Type of Cryotherapy Ice pack                  PT Short Term Goals - 12/11/14 1323    PT SHORT TERM GOAL #1   Title be indepenent with initial HEP for functional strength and ROM. 12/13/14   Time 4   Period Weeks   Status New   PT SHORT TERM GOAL #2   Title report tolerating gym activities without increased pain. 12/13/14   Time 4   Period Weeks   Status New   PT SHORT TERM GOAL #3   Title demonstrate right SLR without quad lag. 12/13/14   Time 4   Period Weeks   Status  Achieved           PT Long Term Goals - 12/11/14 1316    PT LONG TERM GOAL #1   Title Demonstrate and/or verbalize technique to reduce the risk of re-injury to include info on physical activity. 01/03/15   Time 6   Period Weeks   Status New   PT LONG TERM GOAL #2   Title be independent with advanced HEP and gym activities for balance, strength, and conditioning. 01/03/15   Time 6   Period Weeks   Status New   PT LONG TERM GOAL #3   Title report pain decrease to no greater than 5/10 at worst. 01/03/15   Time 6   Period Weeks   Status New   PT LONG TERM GOAL #4   Title report 25% improvement in sleep. 01/03/15   Time 6   Period Weeks   Status New   PT LONG TERM GOAL #5   Title report able to ambulate at least 15 minutes with pain no more then 5/10. 01/03/15   Time 6   Period Weeks   Status New   Additional Long Term Goals   Additional Long Term Goals Yes   PT LONG TERM GOAL #6   Title functional reporting on FOTO with no greater than 47% limitation. 01/03/15   Time 6   Period Weeks   Status New               Plan - 12/13/14 1019    Clinical Impression Statement Tolerated more standing activities well.    Pt will benefit from skilled therapeutic intervention in order to improve on the following deficits Decreased strength;Decreased mobility;Decreased activity tolerance;Decreased balance   Rehab Potential Good   PT Next Visit Plan Continue with more standing activities for strengthening and balance.Geanie Cooley and Agree with Plan of Care Patient        Problem List Patient Active Problem List   Diagnosis Date Noted  . 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 Osteoarthritis of right knee 05/15/2014  . OSA (obstructive sleep apnea) 04/22/2014  . HTN (hypertension) 12/14/2013  . Meralgia paraesthetica 12/14/2013  . Postoperative anemia due to acute blood loss 12/14/2013  .  OA (osteoarthritis) of knee 12/11/2013  . Osteoarthritis of left knee 12/05/2013  . Total knee  replacement status 12/05/2013    Barbette Hair PTA 12/13/2014, 10:59 AM  [facility] [address]  [place], [place], [address] Phone: [phone]   Fax:  [phone]

## 2014-12-16 DIAGNOSIS — Z01818 Encounter for other preprocedural examination: Secondary | ICD-10-CM | POA: Diagnosis not present

## 2014-12-18 ENCOUNTER — Ambulatory Visit: Payer: Medicare Other

## 2014-12-18 DIAGNOSIS — M25562 Pain in left knee: Principal | ICD-10-CM

## 2014-12-18 DIAGNOSIS — R262 Difficulty in walking, not elsewhere classified: Secondary | ICD-10-CM

## 2014-12-18 DIAGNOSIS — M25561 Pain in right knee: Secondary | ICD-10-CM

## 2014-12-18 NOTE — Progress Notes (Signed)
  Pre-Operative Nutrition Class:  Appt start time: 7902   End time:  1830.  Patient was seen on 12/16/2014 for Pre-Operative Bariatric Surgery Education at [facility].   Surgery date: 01/06/15 Surgery type: Gastric sleeve [facility] weight at [facility]: 332 lbs on 06/10/14 Weight today: 330 lbs  TANITA  BODY COMP RESULTS  12/16/14   BMI (kg/m^2) 45.4   Fat Mass (lbs) 142   Fat Free Mass (lbs) 188   Total Body Water (lbs) 137.5   Samples given per MNT protocol. Patient educated on appropriate usage: Premier protein shake (strawberry - qty 1) Lot #: [id number] Exp: 06/2015  Unjury protein powder (chicken soup - qty 1) Lot #: [id number] Exp: 01/2016  PB2 (qty 1) Lot #: [id number] Exp: 05/2015  Bariatric Advantage Calcium Citrate chew (orange - qty 1) Lot #: [id number] Exp: 02/2015   The following the learning objectives were met by the patient during this course:  Identify Pre-Op Dietary Goals and will begin 2 weeks pre-operatively  Identify appropriate sources of fluids and proteins   State protein recommendations and appropriate sources pre and post-operatively  Identify Post-Operative Dietary Goals and will follow for 2 weeks post-operatively  Identify appropriate multivitamin and calcium sources  Describe the need for physical activity post-operatively and will follow MD recommendations  State when to call healthcare provider regarding medication questions or post-operative complications  Handouts given during class include:  Pre-Op Bariatric Surgery Diet Handout  Protein Shake Handout  Post-Op Bariatric Surgery Nutrition Handout  [facility]EL[facility] Program Information Flyer  Support Group Information Flyer  [facility] Bariatric Supplements Price List  Follow-Up Plan: Patient will follow-up at [facility] 2 weeks post operatively for diet advancement per MD.

## 2014-12-18 NOTE — Therapy (Addendum)
[facility] [address]  [place], [place], [address] Phone: [phone]   Fax:  [phone]  Physical Therapy Treatment  Patient Details  Name: Jonathan Forbes MRN: [id number] Date of Birth: 1951/09/19 Referring Provider:  Marianna Payment, MD  Encounter Date: 12/18/2014      PT End of Session - 12/18/14 1452    Visit Number 7   Number of Visits 12   Date for PT Re-Evaluation 01/03/15   PT Start Time 1316   PT Stop Time 1405   PT Time Calculation (min) 49 min   Activity Tolerance Patient tolerated treatment well   Behavior During Therapy [facility] for tasks assessed/performed      Past Medical History  Diagnosis Date  . Hypertension   . Depression   . Glaucoma     Hx; of beginning stages  . Eczema     Hx; of  . Anxiety   . Mitral valve regurgitation     Hx; of slight  . Heart murmur   . OSA (obstructive sleep apnea)     not using CPAP at present, will have a sleep study in August  . OA (osteoarthritis) of knee     "both" (05/16/2014)  . Skin cancer 08/2013    "back"    Past Surgical History  Procedure Laterality Date  . Tumor excision      skin cancer of right side of back  . Cataract extraction w/ intraocular lens  implant, bilateral Bilateral   . Colonoscopy      Hx; of  . Tonsillectomy    . Total knee arthroplasty Left 12/05/2013    Procedure: LEFT TOTAL KNEE ARTHROPLASTY;  Surgeon: Marianna Payment, MD;  Location: [facility];  Service: Orthopedics;  Laterality: Left;  Marland Kitchen Eye surgery    . Joint replacement    . Breath tek h pylori N/A 05/14/2014    Procedure: BREATH TEK H PYLORI;  Surgeon: Pedro Earls, MD;  Location: [facility];  Service: General;  Laterality: N/A;  . Total knee arthroplasty Right 05/15/2014    Procedure: RIGHT TOTAL KNEE ARTHROPLASTY;  Surgeon: Marianna Payment, MD;  Location: [facility];  Service: Orthopedics;  Laterality: Right;  . Skin cancer excision  08/2013    "off my  back; not melanoma"    There were no vitals taken for this visit.  Visit Diagnosis:  Knee pain, bilateral  Difficulty walking      Subjective Assessment - 12/18/14 1317    Symptoms Little pain in my knees.  Taking so long to get into pain clinic my family doctor gave me some medicine for pain.  Take it at night only and helps me sleep.  Went to gym and did a machine similar to nu-step and did exercises.  Bands really help.   Currently in Pain? Yes   Pain Score 7    Pain Location Knee   Pain Orientation Right;Left   Pain Type Chronic pain   Pain Onset More than a month ago   Pain Frequency Intermittent   Aggravating Factors  weather, up on feet too long.   Pain Relieving Factors switching shoes helps some, stretching,                     [facility] Adult PT Treatment/Exercise - 12/18/14 0001    Knee/Hip Exercises: Stretches   Active Hamstring Stretch 3 reps;20 seconds   Gastroc Stretch 3 reps;20 seconds  feet on  edge of track for arm bike   Knee/Hip Exercises: Aerobic   Stationary Bike nu step level 4 LE only x 5.5 min   Knee/Hip Exercises: Seated   Other Seated Knee Exercises seated knee extension against fitter 1 black 1 blue 2x 10 each leg   Other Seated Knee Exercises seated knee flexion blue band 3 x 10 reps each leg   Knee/Hip Exercises: Supine   Bridges 10 reps;2 sets;Both;Strengthening   Straight Leg Raises Strengthening;2 sets;10 reps  2#   Knee Extension --   Knee Flexion --   Modalities   Modalities Cryotherapy   Cryotherapy   Number Minutes Cryotherapy 20 Minutes   Cryotherapy Location Knee  both   Type of Cryotherapy Ice pack                  PT Short Term Goals - 12/18/14 1454    PT SHORT TERM GOAL #1   Title be indepenent with initial HEP for functional strength and ROM. 12/13/14   Status Achieved   PT SHORT TERM GOAL #2   Title report tolerating gym activities without increased pain. 12/13/14   Status On-going           PT  Long Term Goals - 12/18/14 1454    PT LONG TERM GOAL #1   Title Demonstrate and/or verbalize technique to reduce the risk of re-injury to include info on physical activity. 01/03/15   Status On-going   PT LONG TERM GOAL #2   Title be independent with advanced HEP and gym activities for balance, strength, and conditioning. 01/03/15   Status On-going   PT LONG TERM GOAL #3   Title report pain decrease to no greater than 5/10 at worst. 01/03/15   PT LONG TERM GOAL #4   Status On-going   PT LONG TERM GOAL #5   Title report able to ambulate at least 15 minutes with pain no more then 5/10. 01/03/15   Status On-going   PT LONG TERM GOAL #6   Title functional reporting on FOTO with no greater than 47% limitation. 01/03/15   Status On-going               Plan - 12/18/14 1452    Clinical Impression Statement Patient continues to feel better with therapy.  Feel most benefit will come with weight reduction with surgery planned next month.  Patient compliant with report of HEP and gym activities with progress to goals.  Continue skilled PT.   PT Next Visit Plan Continue strength hips, knees, start for feet/ankles   Consulted and Agree with Plan of Care Patient        Problem List Patient Active Problem List   Diagnosis Date Noted  . 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 Osteoarthritis of right knee 05/15/2014  . OSA (obstructive sleep apnea) 04/22/2014  . HTN (hypertension) 12/14/2013  . Meralgia paraesthetica 12/14/2013  . Postoperative anemia due to acute blood loss 12/14/2013  . OA (osteoarthritis) of knee 12/11/2013  . Osteoarthritis of left knee 12/05/2013  . Total knee replacement status 12/05/2013    WYNN,CYNDI 12/18/2014, 2:59 PM  Magda Kiel, PT  [facility] [address]  [place], [place], [address] Phone: [phone]   Fax:  [phone]     PHYSICAL THERAPY DISCHARGE SUMMARY  Visits from Start of Care:  7  Current functional level related to goals / functional outcomes: Patient reports pain up to 7/10 with being up  on his feet too long.  Pain improved with stretches and with shoe changes.  Has had some improvement in sleep due to medications, but did not report percentage of improvement.  He was independent in initial HEP and has begun going to the gym.  He reports feeling improved with ambulation tolerance as well.  Plans on gastric surgery in next couple of weeks for weight loss.  Wishes to d/c due to financial reasons.   Remaining deficits: Pain Strength Sleep   Education / Equipment: Educated in [employer], [employer], gym activities. Plan: Patient agrees to discharge.  Patient goals were not met. Patient is being discharged due to the physician's request.  ?????

## 2014-12-20 ENCOUNTER — Ambulatory Visit (INDEPENDENT_AMBULATORY_CARE_PROVIDER_SITE_OTHER): Payer: Self-pay | Admitting: Surgery

## 2014-12-20 NOTE — H&P (Signed)
Chief Complaint: BMI 47 with degenerative arthritis of the knees  For sleeve gastrectomy  History of Present Illness: Jonathan Forbes is an 64 y.o. male who has been to one of our seminars and comes in to discuss Roux-en-Y gastric bypass and sleeve. We briefly discussed lap band but quickly 100 and on the advantages of a sleeve for someone with significant arthritis and need for NSAIDS. I reviewed both procedures with the chart and he wants to think about the sleeve and may actually be leaning in that direction. I gave him permits on both.  As these become more debilitated with his knees his weight is: He is unable to successfully lose weight. His left knee was replaced 4 months ago and his right knee is scheduled to be replaced. He has never had abdominal surgery.  UGI showed small hiatus hernia.    Past Medical History  Diagnosis Date  . Hypertension   . OA (osteoarthritis) of knee     Hx: of B/L knees  . Depression   . Cancer     Hx: of skin cancer  . Glaucoma     Hx; of beginning stages  . Eczema     Hx; of  . Anxiety   . Sleep apnea   . Mitral valve regurgitation     Hx; of slight    Past Surgical History  Procedure Laterality Date  . Tumor excision      skin cancer of right side of back  . Cataract extraction w/ intraocular lens implant, bilateral      Hx; of  . Colonoscopy      Hx; of  . Tonsillectomy    . Total knee arthroplasty Left 12/05/2013    Procedure: LEFT TOTAL KNEE ARTHROPLASTY; Surgeon: Marianna Payment, MD; Location: [facility]; Service: Orthopedics; Laterality: Left;    Current Outpatient Prescriptions  Medication Sig Dispense Refill  . amLODipine (NORVASC) 10 MG tablet Take 10 mg by mouth daily.    . carvedilol (COREG) 25 MG tablet Take 25 mg by mouth 2 (two) times daily with a meal.    . furosemide (LASIX) 40 MG tablet Take 40 mg by mouth  daily.    Marland Kitchen lisinopril (PRINIVIL,ZESTRIL) 40 MG tablet Take 40 mg by mouth daily.    . traMADol (ULTRAM) 50 MG tablet Take 50 mg by mouth 4 (four) times daily.     No current facility-administered medications for this visit.   Review of patient's allergies indicates no known allergies. Family History  Problem Relation Age of Onset  . Heart disease Father   . Hypertension Father    Social History:  reports that he has never smoked. He has never used smokeless tobacco. He reports that he drinks alcohol. He reports that he does not use illicit drugs.   REVIEW OF SYSTEMS : Positive for osteoarthritis ; otherwise negative  Physical Exam:  Blood pressure 170/90, pulse 78, temperature 97.2 F (36.2 C), resp. rate 16, height 5' 11.5" (1.816 m), weight 341 lb (154.677 kg). Body mass index is 46.9 kg/(m^2).  Gen: WDWN WM NAD  Neurological: Alert and oriented to person, place, and time. Motor and sensory function is grossly intact  Head: Normocephalic and atraumatic.  Eyes: Conjunctivae are normal. Pupils are equal, round, and reactive to light. No scleral icterus. Some disconjugate gaze Neck: Normal range of motion. Neck supple. No tracheal deviation or thyromegaly present.  Cardiovascular: SR without murmurs or gallops. No carotid bruits Breast: Not palpated Respiratory: Effort normal. No  respiratory distress. No chest wall tenderness. Breath sounds normal. No wheezes, rales or rhonchi.  Abdomen: nontender and obese GU: Not examined  Musculoskeletal: Normal range of motion. Extremities are nontender. No cyanosis, edema or clubbing noted Lymphadenopathy: No cervical, preauricular, postauricular or axillary adenopathy is present Skin: Skin is warm and dry. No rash noted. No diaphoresis. No erythema. No pallor. Pscyh: Normal mood and affect. Behavior is normal. Judgment and thought content normal.   LABORATORY RESULTS:  Lab Results Last 48  Hours    No results found for this or any previous visit (from the past 48 hour(s)).     RADIOLOGY RESULTS:  Imaging Results (Last 48 hours)    No results found.    Problem List: Patient Active Problem List   Diagnosis Date Noted  . OSA (obstructive sleep apnea) 04/22/2014  . HTN (hypertension) 12/14/2013  . Meralgia paraesthetica 12/14/2013  . Postoperative anemia due to acute blood loss 12/14/2013  . OA (osteoarthritis) of knee 12/11/2013  . Osteoarthritis of left knee 12/05/2013  . Total knee replacement status 12/05/2013    Assessment & Plan: Morbid obesity with multiple comorbidities; For sleeve gastrectomy on March 14th.  May need CPAP application after surgery.  Small hiatus hernia on UGI  Matt B. Hassell Done, MD, [facility] [phone] beeper [phone]

## 2014-12-25 ENCOUNTER — Ambulatory Visit: Payer: Medicare Other

## 2014-12-27 ENCOUNTER — Ambulatory Visit: Payer: Medicare Other | Admitting: Rehabilitation

## 2014-12-31 ENCOUNTER — Encounter (HOSPITAL_COMMUNITY): Payer: Self-pay

## 2014-12-31 ENCOUNTER — Encounter (HOSPITAL_COMMUNITY)
Admission: RE | Admit: 2014-12-31 | Discharge: 2014-12-31 | Disposition: A | Discharge status: Home / Self Care | Payer: Medicare Other | Source: Ambulatory Visit | Attending: Surgery | Admitting: Surgery

## 2014-12-31 DIAGNOSIS — Z01818 Encounter for other preprocedural examination: Secondary | ICD-10-CM | POA: Diagnosis present

## 2014-12-31 LAB — CBC WITH DIFFERENTIAL/PLATELET
Basophils Absolute: 0 10*3/uL (ref 0.0–0.1)
Basophils Relative: 0 % (ref 0–1)
Eosinophils Absolute: 0.3 10*3/uL (ref 0.0–0.7)
Eosinophils Relative: 3 % (ref 0–5)
HEMATOCRIT: 45.9 % (ref 39.0–52.0)
Hemoglobin: 15.1 g/dL (ref 13.0–17.0)
Lymphocytes Relative: 23 % (ref 12–46)
Lymphs Abs: 2.2 10*3/uL (ref 0.7–4.0)
MCH: 29.7 pg (ref 26.0–34.0)
MCHC: 32.9 g/dL (ref 30.0–36.0)
MCV: 90.4 fL (ref 78.0–100.0)
MONO ABS: 0.8 10*3/uL (ref 0.1–1.0)
Monocytes Relative: 8 % (ref 3–12)
NEUTROS ABS: 6.2 10*3/uL (ref 1.7–7.7)
Neutrophils Relative %: 66 % (ref 43–77)
PLATELETS: 199 10*3/uL (ref 150–400)
RBC: 5.08 MIL/uL (ref 4.22–5.81)
RDW: 13 % (ref 11.5–15.5)
WBC: 9.5 10*3/uL (ref 4.0–10.5)

## 2014-12-31 LAB — COMPREHENSIVE METABOLIC PANEL
ALBUMIN: 4.2 g/dL (ref 3.5–5.2)
ALT: 25 U/L (ref 0–53)
AST: 16 U/L (ref 0–37)
Alkaline Phosphatase: 83 U/L (ref 39–117)
Anion gap: 5 (ref 5–15)
BUN: 22 mg/dL (ref 6–23)
CHLORIDE: 105 mmol/L (ref 96–112)
CO2: 28 mmol/L (ref 19–32)
Calcium: 8.9 mg/dL (ref 8.4–10.5)
Creatinine, Ser: 0.93 mg/dL (ref 0.50–1.35)
GFR calc Af Amer: >90 mL/min (ref >90)
GFR, EST NON AFRICAN AMERICAN: 88 mL/min — AB (ref >90)
GLUCOSE: 125 mg/dL — AB (ref 70–99)
POTASSIUM: 4 mmol/L (ref 3.5–5.1)
SODIUM: 138 mmol/L (ref 135–145)
TOTAL PROTEIN: 7.1 g/dL (ref 6.0–8.3)
Total Bilirubin: 0.7 mg/dL (ref 0.3–1.2)

## 2014-12-31 NOTE — Progress Notes (Signed)
LOV note 11/30/13 Dr. Claudie Leach on chart, LOV note 11/28/13 Dr. Verdie Mosher on chart, ECHO 2015 on chart, sleep test results Dr. Shelia Media 2003 on chart, Pulmonary function test Dr. Verdie Mosher 11/28/13 on chart, lower extremity ultrasound 11/26/13 on chart, carotid ultrasound 11/26/13 on chart, EKG 05/09/14 on EPIC

## 2014-12-31 NOTE — Progress Notes (Signed)
   12/31/14 1127  OBSTRUCTIVE SLEEP APNEA  Have you ever been diagnosed with sleep apnea through a sleep study? Yes  If yes, do you have and use a CPAP or BPAP machine every night? 0  Do you snore loudly (loud enough to be heard through closed doors)?  1  Do you often feel tired, fatigued, or sleepy during the daytime? 0  Has anyone observed you stop breathing during your sleep? 0  Do you have, or are you being treated for high blood pressure? 1  BMI more than 35 kg/m2? 1  Age over 33 years old? 1  Neck circumference greater than 40 cm/16 inches? 1  Gender: 1

## 2014-12-31 NOTE — Patient Instructions (Addendum)
Jonathan Forbes  12/31/2014   Your procedure is scheduled on: Monday 01/06/15  Report to [facility]  Entrance and follow signs to               [place] at 08:00 AM.  Call this number if you have problems the morning of surgery [phone]   Remember:  Do not eat food or drink liquids :After Midnight.    Take these medicines the morning of surgery with A SIP OF WATER: amlodipine, carvedilol, viibryd                               You may not have any metal on your body including hair pins and              piercings  Do not wear jewelry, make-up, lotions, powders or perfumes.             Do not wear nail polish.  Do not shave  48 hours prior to surgery.              Men may shave face and neck.  Do not bring valuables to the hospital. [place].  Contacts, dentures or bridgework may not be worn into surgery.  Leave suitcase in the car. After surgery it may be brought to your room.  _____________________________________________________________________    [facility] - Preparing for Surgery Before surgery, you can play an important role.  Because skin is not sterile, your skin needs to be as free of germs as possible.  You can reduce the number of germs on your skin by washing with CHG (chlorahexidine gluconate) soap before surgery.  CHG is an antiseptic cleaner which kills germs and bonds with the skin to continue killing germs even after washing. Please DO NOT use if you have an allergy to CHG or antibacterial soaps.  If your skin becomes reddened/irritated stop using the CHG and inform your nurse when you arrive at Short Stay. Do not shave (including legs and underarms) for at least 48 hours prior to the first CHG shower.  You may shave your face/neck. Please follow these instructions carefully:  1.  Shower with CHG Soap the night before surgery and the  morning of Surgery.  2.  If you choose to wash  your hair, wash your hair first as usual with your  normal  shampoo.  3.  After you shampoo, rinse your hair and body thoroughly to remove the  shampoo.                            4.  Use CHG as you would any other liquid soap.  You can apply chg directly  to the skin and wash                       Gently with a scrungie or clean washcloth.  5.  Apply the CHG Soap to your body ONLY FROM THE NECK DOWN.   Do not use on face/ open                           Wound or open sores.  Avoid contact with eyes, ears mouth and genitals (private parts).                       Wash face,  Genitals (private parts) with your normal soap.             6.  Wash thoroughly, paying special attention to the area where your surgery  will be performed.  7.  Thoroughly rinse your body with warm water from the neck down.  8.  DO NOT shower/wash with your normal soap after using and rinsing off  the CHG Soap.                9.  Pat yourself dry with a clean towel.            10.  Wear clean pajamas.            11.  Place clean sheets on your bed the night of your first shower and do not  sleep with pets. Day of Surgery : Do not apply any lotions/deodorants the morning of surgery.  Please wear clean clothes to the hospital/surgery center.  FAILURE TO FOLLOW THESE INSTRUCTIONS MAY RESULT IN THE CANCELLATION OF YOUR SURGERY PATIENT SIGNATURE_________________________________  NURSE SIGNATURE__________________________________  ________________________________________________________________________

## 2015-01-06 ENCOUNTER — Inpatient Hospital Stay (HOSPITAL_COMMUNITY): Payer: Medicare Other | Admitting: Certified Registered"

## 2015-01-06 ENCOUNTER — Encounter (HOSPITAL_COMMUNITY)
Admission: RE | Disposition: A | Discharge status: Home / Self Care | Payer: Self-pay | Source: Ambulatory Visit | Attending: Surgery

## 2015-01-06 ENCOUNTER — Encounter (HOSPITAL_COMMUNITY): Payer: Self-pay | Admitting: *Deleted

## 2015-01-06 ENCOUNTER — Inpatient Hospital Stay (HOSPITAL_COMMUNITY)
Admission: RE | Admit: 2015-01-06 | Discharge: 2015-01-08 | DRG: 621 | Disposition: A | Discharge status: Home / Self Care | Payer: Medicare Other | Source: Ambulatory Visit | Attending: Surgery | Admitting: Surgery

## 2015-01-06 DIAGNOSIS — Z01812 Encounter for preprocedural laboratory examination: Secondary | ICD-10-CM

## 2015-01-06 DIAGNOSIS — Z9884 Bariatric surgery status: Secondary | ICD-10-CM

## 2015-01-06 DIAGNOSIS — Z96652 Presence of left artificial knee joint: Secondary | ICD-10-CM | POA: Diagnosis present

## 2015-01-06 DIAGNOSIS — Z6841 Body Mass Index (BMI) 40.0 and over, adult: Secondary | ICD-10-CM

## 2015-01-06 DIAGNOSIS — I10 Essential (primary) hypertension: Secondary | ICD-10-CM | POA: Diagnosis present

## 2015-01-06 DIAGNOSIS — G4730 Sleep apnea, unspecified: Secondary | ICD-10-CM | POA: Diagnosis present

## 2015-01-06 DIAGNOSIS — K449 Diaphragmatic hernia without obstruction or gangrene: Secondary | ICD-10-CM | POA: Diagnosis present

## 2015-01-06 HISTORY — PX: LAPAROSCOPIC GASTRIC SLEEVE RESECTION WITH HIATAL HERNIA REPAIR: SHX6512

## 2015-01-06 HISTORY — PX: UPPER GI ENDOSCOPY: SHX6162

## 2015-01-06 LAB — CBC
HCT: 47.1 % (ref 39.0–52.0)
HEMOGLOBIN: 16.2 g/dL (ref 13.0–17.0)
MCH: 30.4 pg (ref 26.0–34.0)
MCHC: 34.4 g/dL (ref 30.0–36.0)
MCV: 88.4 fL (ref 78.0–100.0)
Platelets: 171 10*3/uL (ref 150–400)
RBC: 5.33 MIL/uL (ref 4.22–5.81)
RDW: 13.1 % (ref 11.5–15.5)
WBC: 13.9 10*3/uL — ABNORMAL HIGH (ref 4.0–10.5)

## 2015-01-06 LAB — CREATININE, SERUM
CREATININE: 1.01 mg/dL (ref 0.50–1.35)
GFR, EST AFRICAN AMERICAN: 89 mL/min — AB (ref >90)
GFR, EST NON AFRICAN AMERICAN: 77 mL/min — AB (ref >90)

## 2015-01-06 SURGERY — GASTRECTOMY, SLEEVE, LAPAROSCOPIC, WITH HIATAL HERNIA REPAIR
Anesthesia: General

## 2015-01-06 MED ORDER — BUPIVACAINE LIPOSOME 1.3 % IJ SUSP
INTRAMUSCULAR | Status: DC | PRN
  Administered 2015-01-06: 20 mL

## 2015-01-06 MED ORDER — ONDANSETRON HCL 4 MG/2ML IJ SOLN
INTRAMUSCULAR | Status: AC
  Filled 2015-01-06: qty 2

## 2015-01-06 MED ORDER — CHLORHEXIDINE GLUCONATE CLOTH 2 % EX PADS: 6.0000 | MEDICATED_PAD | Freq: Once | CUTANEOUS | Status: DC

## 2015-01-06 MED ORDER — 0.9 % SODIUM CHLORIDE (POUR BTL) OPTIME
TOPICAL | Status: DC | PRN
  Administered 2015-01-06: 1000 mL

## 2015-01-06 MED ORDER — UNJURY CHICKEN SOUP POWDER: 2.0000 [oz_av] | Freq: Four times a day (QID) | ORAL | Status: DC

## 2015-01-06 MED ORDER — KCL IN DEXTROSE-NACL 20-5-0.45 MEQ/L-%-% IV SOLN
INTRAVENOUS | Status: DC
  Administered 2015-01-06: 17:00:00 via INTRAVENOUS
  Administered 2015-01-07 (×2): 1000 mL via INTRAVENOUS
  Filled 2015-01-06 (×5): qty 1000

## 2015-01-06 MED ORDER — DEXTROSE 5 % IV SOLN
INTRAVENOUS | Status: AC
  Filled 2015-01-06: qty 2

## 2015-01-06 MED ORDER — GLYCOPYRROLATE 0.2 MG/ML IJ SOLN
INTRAMUSCULAR | Status: DC | PRN
  Administered 2015-01-06: 0.6 mg via INTRAVENOUS

## 2015-01-06 MED ORDER — BUPIVACAINE LIPOSOME 1.3 % IJ SUSP
20.0000 mL | Freq: Once | INTRAMUSCULAR | Status: DC
  Filled 2015-01-06: qty 20

## 2015-01-06 MED ORDER — SUCCINYLCHOLINE CHLORIDE 20 MG/ML IJ SOLN
INTRAMUSCULAR | Status: DC | PRN
  Administered 2015-01-06: 150 mg via INTRAVENOUS

## 2015-01-06 MED ORDER — LACTATED RINGERS IV SOLN
INTRAVENOUS | Status: DC | PRN
  Administered 2015-01-06: 09:00:00 via INTRAVENOUS

## 2015-01-06 MED ORDER — CHLORHEXIDINE GLUCONATE 0.12 % MT SOLN
15.0000 mL | Freq: Two times a day (BID) | OROMUCOSAL | Status: DC
  Administered 2015-01-06 – 2015-01-08 (×4): 15 mL via OROMUCOSAL
  Filled 2015-01-06 (×5): qty 15

## 2015-01-06 MED ORDER — GLYCOPYRROLATE 0.2 MG/ML IJ SOLN
INTRAMUSCULAR | Status: AC
  Filled 2015-01-06: qty 3

## 2015-01-06 MED ORDER — OXYCODONE HCL 5 MG/5ML PO SOLN
5.0000 mg | Freq: Once | ORAL | Status: DC | PRN
  Filled 2015-01-06: qty 5

## 2015-01-06 MED ORDER — ACETAMINOPHEN 160 MG/5ML PO SOLN: 650.0000 mg | ORAL | Status: DC | PRN

## 2015-01-06 MED ORDER — LIDOCAINE HCL (PF) 2 % IJ SOLN
INTRAMUSCULAR | Status: DC | PRN
  Administered 2015-01-06: 40 mg via INTRADERMAL

## 2015-01-06 MED ORDER — PROPOFOL 10 MG/ML IV BOLUS
INTRAVENOUS | Status: AC
  Filled 2015-01-06: qty 20

## 2015-01-06 MED ORDER — ROCURONIUM BROMIDE 100 MG/10ML IV SOLN
INTRAVENOUS | Status: DC | PRN
  Administered 2015-01-06 (×2): 10 mg via INTRAVENOUS
  Administered 2015-01-06: 20 mg via INTRAVENOUS
  Administered 2015-01-06: 50 mg via INTRAVENOUS

## 2015-01-06 MED ORDER — CETYLPYRIDINIUM CHLORIDE 0.05 % MT LIQD
7.0000 mL | Freq: Two times a day (BID) | OROMUCOSAL | Status: DC
  Administered 2015-01-07: 7 mL via OROMUCOSAL

## 2015-01-06 MED ORDER — MIDAZOLAM HCL 2 MG/2ML IJ SOLN
INTRAMUSCULAR | Status: AC
  Filled 2015-01-06: qty 2

## 2015-01-06 MED ORDER — DEXAMETHASONE SODIUM PHOSPHATE 10 MG/ML IJ SOLN
INTRAMUSCULAR | Status: AC
  Filled 2015-01-06: qty 1

## 2015-01-06 MED ORDER — FENTANYL CITRATE 0.05 MG/ML IJ SOLN
INTRAMUSCULAR | Status: AC
  Filled 2015-01-06: qty 5

## 2015-01-06 MED ORDER — KCL IN DEXTROSE-NACL 20-5-0.45 MEQ/L-%-% IV SOLN
INTRAVENOUS | Status: AC
  Filled 2015-01-06: qty 1000

## 2015-01-06 MED ORDER — SODIUM CHLORIDE 0.9 % IJ SOLN
INTRAMUSCULAR | Status: AC
  Administered 2015-01-06: 10 mL
  Filled 2015-01-06: qty 10

## 2015-01-06 MED ORDER — PANTOPRAZOLE SODIUM 40 MG IV SOLR
40.0000 mg | Freq: Every day | INTRAVENOUS | Status: DC
  Administered 2015-01-06 – 2015-01-07 (×3): 40 mg via INTRAVENOUS
  Filled 2015-01-06 (×4): qty 40

## 2015-01-06 MED ORDER — HEPARIN SODIUM (PORCINE) 5000 UNIT/ML IJ SOLN
5000.0000 [IU] | Freq: Three times a day (TID) | INTRAMUSCULAR | Status: DC
  Administered 2015-01-06 – 2015-01-08 (×6): 5000 [IU] via SUBCUTANEOUS
  Filled 2015-01-06 (×9): qty 1

## 2015-01-06 MED ORDER — PROMETHAZINE HCL 25 MG/ML IJ SOLN
INTRAMUSCULAR | Status: AC
  Filled 2015-01-06: qty 1

## 2015-01-06 MED ORDER — ROCURONIUM BROMIDE 100 MG/10ML IV SOLN
INTRAVENOUS | Status: AC
  Filled 2015-01-06: qty 1

## 2015-01-06 MED ORDER — HYDROMORPHONE HCL 1 MG/ML IJ SOLN
0.2500 mg | INTRAMUSCULAR | Status: DC | PRN
  Administered 2015-01-06 (×4): 0.5 mg via INTRAVENOUS

## 2015-01-06 MED ORDER — ACETAMINOPHEN 160 MG/5ML PO SOLN
325.0000 mg | ORAL | Status: DC | PRN
  Administered 2015-01-07 – 2015-01-08 (×2): 650 mg via ORAL
  Filled 2015-01-06 (×2): qty 20.3

## 2015-01-06 MED ORDER — FENTANYL CITRATE 0.05 MG/ML IJ SOLN
INTRAMUSCULAR | Status: DC | PRN
  Administered 2015-01-06: 100 ug via INTRAVENOUS
  Administered 2015-01-06 (×3): 50 ug via INTRAVENOUS

## 2015-01-06 MED ORDER — PROPOFOL 10 MG/ML IV BOLUS
INTRAVENOUS | Status: DC | PRN
  Administered 2015-01-06: 200 mg via INTRAVENOUS

## 2015-01-06 MED ORDER — UNJURY VANILLA POWDER: 2.0000 [oz_av] | Freq: Four times a day (QID) | ORAL | Status: DC

## 2015-01-06 MED ORDER — DEXAMETHASONE SODIUM PHOSPHATE 10 MG/ML IJ SOLN
INTRAMUSCULAR | Status: DC | PRN
  Administered 2015-01-06: 10 mg via INTRAVENOUS

## 2015-01-06 MED ORDER — MORPHINE SULFATE 2 MG/ML IJ SOLN
2.0000 mg | INTRAMUSCULAR | Status: DC | PRN
  Administered 2015-01-06 (×2): 4 mg via INTRAVENOUS
  Administered 2015-01-06: 2 mg via INTRAVENOUS
  Administered 2015-01-06: 4 mg via INTRAVENOUS
  Administered 2015-01-06: 2 mg via INTRAVENOUS
  Administered 2015-01-07 (×2): 4 mg via INTRAVENOUS
  Administered 2015-01-07: 6 mg via INTRAVENOUS
  Administered 2015-01-07 – 2015-01-08 (×5): 4 mg via INTRAVENOUS
  Filled 2015-01-06 (×2): qty 2
  Filled 2015-01-06: qty 3
  Filled 2015-01-06 (×3): qty 2
  Filled 2015-01-06: qty 1
  Filled 2015-01-06 (×2): qty 2
  Filled 2015-01-06: qty 1
  Filled 2015-01-06 (×3): qty 2

## 2015-01-06 MED ORDER — HYDROMORPHONE HCL 1 MG/ML IJ SOLN
INTRAMUSCULAR | Status: AC
  Filled 2015-01-06: qty 2

## 2015-01-06 MED ORDER — ONDANSETRON HCL 4 MG/2ML IJ SOLN
4.0000 mg | INTRAMUSCULAR | Status: DC | PRN
  Administered 2015-01-06 – 2015-01-08 (×6): 4 mg via INTRAVENOUS
  Filled 2015-01-06 (×6): qty 2

## 2015-01-06 MED ORDER — HEPARIN SODIUM (PORCINE) 5000 UNIT/ML IJ SOLN
5000.0000 [IU] | INTRAMUSCULAR | Status: AC
  Administered 2015-01-06: 5000 [IU] via SUBCUTANEOUS
  Filled 2015-01-06: qty 1

## 2015-01-06 MED ORDER — LACTATED RINGERS IV SOLN: INTRAVENOUS | Status: DC

## 2015-01-06 MED ORDER — NEOSTIGMINE METHYLSULFATE 10 MG/10ML IV SOLN
INTRAVENOUS | Status: AC
  Filled 2015-01-06: qty 1

## 2015-01-06 MED ORDER — ONDANSETRON HCL 4 MG/2ML IJ SOLN
INTRAMUSCULAR | Status: DC | PRN
  Administered 2015-01-06: 4 mg via INTRAVENOUS

## 2015-01-06 MED ORDER — LACTATED RINGERS IR SOLN
Status: DC | PRN
  Administered 2015-01-06: 1000 mL

## 2015-01-06 MED ORDER — DEXTROSE 5 % IV SOLN
2.0000 g | INTRAVENOUS | Status: AC
  Administered 2015-01-06: 2 g via INTRAVENOUS

## 2015-01-06 MED ORDER — SODIUM CHLORIDE 0.9 % IJ SOLN
INTRAMUSCULAR | Status: AC
  Filled 2015-01-06: qty 20

## 2015-01-06 MED ORDER — OXYCODONE HCL 5 MG/5ML PO SOLN
5.0000 mg | ORAL | Status: DC | PRN
  Administered 2015-01-07 – 2015-01-08 (×5): 10 mg via ORAL
  Filled 2015-01-06 (×2): qty 10
  Filled 2015-01-06: qty 50
  Filled 2015-01-06 (×2): qty 10

## 2015-01-06 MED ORDER — OXYCODONE HCL 5 MG PO TABS: 5.0000 mg | ORAL_TABLET | Freq: Once | ORAL | Status: DC | PRN

## 2015-01-06 MED ORDER — PROMETHAZINE HCL 25 MG/ML IJ SOLN
6.2500 mg | INTRAMUSCULAR | Status: DC | PRN
  Administered 2015-01-06: 12.5 mg via INTRAVENOUS

## 2015-01-06 MED ORDER — MIDAZOLAM HCL 5 MG/5ML IJ SOLN
INTRAMUSCULAR | Status: DC | PRN
  Administered 2015-01-06: 2 mg via INTRAVENOUS

## 2015-01-06 MED ORDER — UNJURY CHOCOLATE CLASSIC POWDER
2.0000 [oz_av] | Freq: Four times a day (QID) | ORAL | Status: DC
  Administered 2015-01-08 (×2): 2 [oz_av] via ORAL

## 2015-01-06 MED ORDER — NEOSTIGMINE METHYLSULFATE 10 MG/10ML IV SOLN
INTRAVENOUS | Status: DC | PRN
Start: 2015-01-06 — End: 2015-01-06
  Administered 2015-01-06: 4 mg via INTRAVENOUS

## 2015-01-06 SURGICAL SUPPLY — 61 items
APPLICATOR COTTON TIP 6IN STRL (MISCELLANEOUS) ×4 IMPLANT
APPLIER CLIP 5 13 M/L LIGAMAX5 (MISCELLANEOUS)
APPLIER CLIP ROT 10 11.4 M/L (STAPLE)
APPLIER CLIP ROT 13.4 12 LRG (CLIP)
BLADE SURG 15 STRL LF DISP TIS (BLADE) ×2 IMPLANT
BLADE SURG 15 STRL SS (BLADE) ×2
CABLE HIGH FREQUENCY MONO STRZ (ELECTRODE) ×4 IMPLANT
CLIP APPLIE 5 13 M/L LIGAMAX5 (MISCELLANEOUS) IMPLANT
CLIP APPLIE ROT 10 11.4 M/L (STAPLE) IMPLANT
CLIP APPLIE ROT 13.4 12 LRG (CLIP) IMPLANT
DEVICE SUT QUICK LOAD TK 5 (STAPLE) ×6 IMPLANT
DEVICE SUT TI-KNOT TK 5X26 (MISCELLANEOUS) ×3 IMPLANT
DEVICE SUTURE ENDOST 10MM (ENDOMECHANICALS) ×4 IMPLANT
DEVICE TI KNOT TK5 (MISCELLANEOUS) ×1
DEVICE TROCAR PUNCTURE CLOSURE (ENDOMECHANICALS) ×4 IMPLANT
DISSECTOR BLUNT TIP ENDO 5MM (MISCELLANEOUS) ×4 IMPLANT
DRAPE CAMERA CLOSED 9X96 (DRAPES) IMPLANT
ELECT REM PT RETURN 9FT ADLT (ELECTROSURGICAL) ×4
ELECTRODE REM PT RTRN 9FT ADLT (ELECTROSURGICAL) ×2 IMPLANT
GAUZE SPONGE 4X4 12PLY STRL (GAUZE/BANDAGES/DRESSINGS) IMPLANT
GLOVE BIOGEL M 8.0 STRL (GLOVE) ×4 IMPLANT
GOWN STRL REUS W/TWL XL LVL3 (GOWN DISPOSABLE) ×16 IMPLANT
HANDLE STAPLE EGIA 4 XL (STAPLE) ×4 IMPLANT
HOVERMATT SINGLE USE (MISCELLANEOUS) ×4 IMPLANT
KIT BASIN OR (CUSTOM PROCEDURE TRAY) ×4 IMPLANT
LIQUID BAND (GAUZE/BANDAGES/DRESSINGS) ×4 IMPLANT
NEEDLE SPNL 22GX3.5 QUINCKE BK (NEEDLE) ×4 IMPLANT
PACK UNIVERSAL I (CUSTOM PROCEDURE TRAY) ×4 IMPLANT
PEN SKIN MARKING BROAD (MISCELLANEOUS) ×4 IMPLANT
QUICK LOAD TK 5 (STAPLE) ×2
RELOAD TRI 45 ART MED THCK BLK (STAPLE) ×4 IMPLANT
RELOAD TRI 45 ART MED THCK PUR (STAPLE) IMPLANT
RELOAD TRI 60 ART MED THCK BLK (STAPLE) ×12 IMPLANT
RELOAD TRI 60 ART MED THCK PUR (STAPLE) ×16 IMPLANT
SCISSORS LAP 5X45 EPIX DISP (ENDOMECHANICALS) ×4 IMPLANT
SCRUB PCMX 4 OZ (MISCELLANEOUS) ×4 IMPLANT
SEALANT SURGICAL APPL DUAL CAN (MISCELLANEOUS) IMPLANT
SET IRRIG TUBING LAPAROSCOPIC (IRRIGATION / IRRIGATOR) ×4 IMPLANT
SHEARS CURVED HARMONIC AC 45CM (MISCELLANEOUS) ×4 IMPLANT
SLEEVE ADV FIXATION 5X100MM (TROCAR) ×8 IMPLANT
SLEEVE GASTRECTOMY 36FR VISIGI (MISCELLANEOUS) ×4 IMPLANT
SOLUTION ANTI FOG 6CC (MISCELLANEOUS) ×4 IMPLANT
SPONGE LAP 18X18 X RAY DECT (DISPOSABLE) ×4 IMPLANT
STAPLER VISISTAT 35W (STAPLE) ×4 IMPLANT
SUT SURGIDAC NAB ES-9 0 48 120 (SUTURE) ×8 IMPLANT
SUT VIC AB 4-0 SH 18 (SUTURE) ×4 IMPLANT
SUT VICRYL 0 TIES 12 18 (SUTURE) ×4 IMPLANT
SYR 20CC LL (SYRINGE) ×4 IMPLANT
SYR 50ML LL SCALE MARK (SYRINGE) ×4 IMPLANT
TOWEL OR 17X26 10 PK STRL BLUE (TOWEL DISPOSABLE) ×8 IMPLANT
TOWEL OR NON WOVEN STRL DISP B (DISPOSABLE) ×4 IMPLANT
TRAY FOLEY CATH 14FRSI W/METER (CATHETERS) IMPLANT
TROCAR ADV FIXATION 12X100MM (TROCAR) ×4 IMPLANT
TROCAR ADV FIXATION 5X100MM (TROCAR) ×4 IMPLANT
TROCAR BLADELESS 15MM (ENDOMECHANICALS) ×4 IMPLANT
TROCAR BLADELESS OPT 5 100 (ENDOMECHANICALS) ×4 IMPLANT
TUBE CALIBRATION LAPBAND (TUBING) ×4 IMPLANT
TUBING CONNECTING 10 (TUBING) ×3 IMPLANT
TUBING CONNECTING 10' (TUBING) ×1
TUBING ENDO SMARTCAP (MISCELLANEOUS) ×4 IMPLANT
TUBING FILTER THERMOFLATOR (ELECTROSURGICAL) IMPLANT

## 2015-01-06 NOTE — Anesthesia Postprocedure Evaluation (Signed)
Anesthesia Post Note  Patient: Jonathan Forbes  Procedure(s) Performed: Procedure(s): LAPAROSCOPIC GASTRIC SLEEVE RESECTION WITH HIATAL HERNIA REPAIR UPPER GI ENDOSCOPY  Anesthesia type: general  Patient location: PACU  Post pain: Pain level controlled  Post assessment: Patient's Cardiovascular Status Stable  Last Vitals:  Filed Vitals:   01/06/15 1300  BP: 148/86  Pulse: 76  Temp: 36.4 C  Resp: 25    Post vital signs: Reviewed and stable  Level of consciousness: sedated  Complications: No apparent anesthesia complications

## 2015-01-06 NOTE — Anesthesia Procedure Notes (Signed)
Procedure Name: Intubation Date/Time: 01/06/2015 9:31 AM Performed by: Lajuana Carry E Pre-anesthesia Checklist: Patient identified, Emergency Drugs available, Suction available and Patient being monitored Patient Re-evaluated:Patient Re-evaluated prior to inductionOxygen Delivery Method: Circle System Utilized Preoxygenation: Pre-oxygenation with 100% oxygen Intubation Type: IV induction Ventilation: Mask ventilation without difficulty Laryngoscope Size: Miller and 3 Grade View: Grade I Tube type: Oral Tube size: 7.5 mm Number of attempts: 1 Airway Equipment and Method: Stylet and Oral airway Placement Confirmation: ETT inserted through vocal cords under direct vision,  positive ETCO2 and breath sounds checked- equal and bilateral Secured at: 21 cm Tube secured with: Tape Dental Injury: Teeth and Oropharynx as per pre-operative assessment

## 2015-01-06 NOTE — H&P (View-Only) (Signed)
Chief Complaint: BMI 47 with degenerative arthritis of the knees  For sleeve gastrectomy  History of Present Illness: Jonathan Forbes is an 64 y.o. male who has been to one of our seminars and comes in to discuss Roux-en-Y gastric bypass and sleeve. We briefly discussed lap band but quickly 100 and on the advantages of a sleeve for someone with significant arthritis and need for NSAIDS. I reviewed both procedures with the chart and he wants to think about the sleeve and may actually be leaning in that direction. I gave him permits on both.  As these become more debilitated with his knees his weight is: He is unable to successfully lose weight. His left knee was replaced 4 months ago and his right knee is scheduled to be replaced. He has never had abdominal surgery.  UGI showed small hiatus hernia.    Past Medical History  Diagnosis Date  . Hypertension   . OA (osteoarthritis) of knee     Hx: of B/L knees  . Depression   . Cancer     Hx: of skin cancer  . Glaucoma     Hx; of beginning stages  . Eczema     Hx; of  . Anxiety   . Sleep apnea   . Mitral valve regurgitation     Hx; of slight    Past Surgical History  Procedure Laterality Date  . Tumor excision      skin cancer of right side of back  . Cataract extraction w/ intraocular lens implant, bilateral      Hx; of  . Colonoscopy      Hx; of  . Tonsillectomy    . Total knee arthroplasty Left 12/05/2013    Procedure: LEFT TOTAL KNEE ARTHROPLASTY; Surgeon: Marianna Payment, MD; Location: [facility]; Service: Orthopedics; Laterality: Left;    Current Outpatient Prescriptions  Medication Sig Dispense Refill  . amLODipine (NORVASC) 10 MG tablet Take 10 mg by mouth daily.    . carvedilol (COREG) 25 MG tablet Take 25 mg by mouth 2 (two) times daily with a meal.    . furosemide (LASIX) 40 MG tablet Take 40 mg by mouth  daily.    Marland Kitchen lisinopril (PRINIVIL,ZESTRIL) 40 MG tablet Take 40 mg by mouth daily.    . traMADol (ULTRAM) 50 MG tablet Take 50 mg by mouth 4 (four) times daily.     No current facility-administered medications for this visit.   Review of patient's allergies indicates no known allergies. Family History  Problem Relation Age of Onset  . Heart disease Father   . Hypertension Father    Social History:  reports that he has never smoked. He has never used smokeless tobacco. He reports that he drinks alcohol. He reports that he does not use illicit drugs.   REVIEW OF SYSTEMS : Positive for osteoarthritis ; otherwise negative  Physical Exam:  Blood pressure 170/90, pulse 78, temperature 97.2 F (36.2 C), resp. rate 16, height 5' 11.5" (1.816 m), weight 341 lb (154.677 kg). Body mass index is 46.9 kg/(m^2).  Gen: WDWN WM NAD  Neurological: Alert and oriented to person, place, and time. Motor and sensory function is grossly intact  Head: Normocephalic and atraumatic.  Eyes: Conjunctivae are normal. Pupils are equal, round, and reactive to light. No scleral icterus. Some disconjugate gaze Neck: Normal range of motion. Neck supple. No tracheal deviation or thyromegaly present.  Cardiovascular: SR without murmurs or gallops. No carotid bruits Breast: Not palpated Respiratory: Effort normal. No  respiratory distress. No chest wall tenderness. Breath sounds normal. No wheezes, rales or rhonchi.  Abdomen: nontender and obese GU: Not examined  Musculoskeletal: Normal range of motion. Extremities are nontender. No cyanosis, edema or clubbing noted Lymphadenopathy: No cervical, preauricular, postauricular or axillary adenopathy is present Skin: Skin is warm and dry. No rash noted. No diaphoresis. No erythema. No pallor. Pscyh: Normal mood and affect. Behavior is normal. Judgment and thought content normal.   LABORATORY RESULTS:  Lab Results Last 48  Hours    No results found for this or any previous visit (from the past 48 hour(s)).     RADIOLOGY RESULTS:  Imaging Results (Last 48 hours)    No results found.    Problem List: Patient Active Problem List   Diagnosis Date Noted  . OSA (obstructive sleep apnea) 04/22/2014  . HTN (hypertension) 12/14/2013  . Meralgia paraesthetica 12/14/2013  . Postoperative anemia due to acute blood loss 12/14/2013  . OA (osteoarthritis) of knee 12/11/2013  . Osteoarthritis of left knee 12/05/2013  . Total knee replacement status 12/05/2013    Assessment & Plan: Morbid obesity with multiple comorbidities; For sleeve gastrectomy on March 14th.  May need CPAP application after surgery.  Small hiatus hernia on UGI  Matt B. Hassell Done, MD, [facility] [phone] beeper [phone]

## 2015-01-06 NOTE — Op Note (Signed)
Name:  Jonathan Forbes MRN: [id number] Date of Surgery: 01/06/2015  Preop Diagnosis:  Morbid Obesity  Postop Diagnosis:  Morbid Obesity (Weight - 341, BMI - 46.9) , S/P Gastric Sleeve  Procedure:  Upper endoscopy  (Intraoperative)  Surgeon:  Alphonsa Overall, M.D.  Anesthesia:  GET  Indications for procedure: ARAGON SCARANTINO is a 64 y.o. male whose primary care physician is Omer Jack, MD and has completed a Gastric Sleeve today by Dr. Hassell Done.  I am doing an intraoperative upper endoscopy to evaluate the gastric pouch.  Operative Note: The patient is under general anesthesia.  Dr. Hassell Done is laparoscoping the patient while I do an upper endoscopy to evaluate the stomach pouch.  With the patient intubated, I passed the Pentax upper endoscope without difficulty down the esophagus.  The esophago-gastric junction was at 40 cm.    The mucosa of the stomach looked viable and the staple line was intact without bleeding.  I advanced to the pylorus, but did not go through it.  While I insufflated the stomach pouch with air, Dr. Hassell Done flooded the upper abdomen with saline to put the gastric pouch under saline.  There was no bubbling or evidence of a leak.  Photos were taken of the gastric pouch.  There was no evidence of narrowing of the pouch and the gastric sleeve looked tubular.  The scope was then withdrawn.  The esophagus was unremarkable and the patient tolerated the endoscopy without difficulty.  Alphonsa Overall, MD, [facility] Pager: [phone] Office phone:  [phone]

## 2015-01-06 NOTE — Anesthesia Preprocedure Evaluation (Signed)
Anesthesia Evaluation  Patient identified by MRN, date of birth, ID band Patient awake    Reviewed: Allergy & Precautions, NPO status , Patient's Chart, lab work & pertinent test results  History of Anesthesia Complications (+) history of anesthetic complications  Airway Mallampati: II  TM Distance: >3 FB Neck ROM: Full    Dental  (+) Teeth Intact, Dental Advisory Given   Pulmonary sleep apnea and Continuous Positive Airway Pressure Ventilation ,          Cardiovascular hypertension,     Neuro/Psych PSYCHIATRIC DISORDERS Depression    GI/Hepatic Neg liver ROS, hiatal hernia,   Endo/Other  negative endocrine ROS  Renal/GU      Musculoskeletal  (+) Arthritis -,   Abdominal   Peds  Hematology   Anesthesia Other Findings   Reproductive/Obstetrics                             Anesthesia Physical Anesthesia Plan  ASA: III  Anesthesia Plan: General   Post-op Pain Management:    Induction: Intravenous  Airway Management Planned: Oral ETT  Additional Equipment:   Intra-op Plan:   Post-operative Plan: Extubation in OR  Informed Consent: I have reviewed the patients History and Physical, chart, labs and discussed the procedure including the risks, benefits and alternatives for the proposed anesthesia with the patient or authorized representative who has indicated his/her understanding and acceptance.   Dental advisory given  Plan Discussed with: CRNA, Anesthesiologist and Surgeon  Anesthesia Plan Comments:         Anesthesia Quick Evaluation

## 2015-01-06 NOTE — Progress Notes (Signed)
Utilization review completed.  

## 2015-01-06 NOTE — Interval H&P Note (Signed)
History and Physical Interval Note:  01/06/2015 8:57 AM  Jonathan Forbes  has presented today for surgery, with the diagnosis of MORBID OBESITY  The various methods of treatment have been discussed with the patient and family. After consideration of risks, benefits and other options for treatment, the patient has consented to  Procedure(s): LAPAROSCOPIC GASTRIC SLEEVE RESECTION WITH UPPER ENDO (N/A) as a surgical intervention .  The patient's history has been reviewed, patient examined, no change in status, stable for surgery.  I have reviewed the patient's chart and labs.  Questions were answered to the patient's satisfaction.     Lotus Santillo B

## 2015-01-06 NOTE — Op Note (Signed)
Surgeon: Kaylyn Lim, MD, FACS  Asst:  Alphonsa Overall, MD, FACS  Anes:  General endotracheal  Procedure: Laparoscopic sleeve gastrectomy; posterior hiatus hernia repair with 2 sutures and upper endoscopy  Diagnosis: Morbid obesity  Complications: none  EBL:   minimal cc  Description of Procedure:  The patient was take to OR 1 and given general anesthesia.  The abdomen was prepped with PCMX and draped sterilely.  A timeout was performed.  Access to the abdomen was achieved with a 5 mm Optiview through the right upper quadrant without difficulty.  Following insufflation, the state of the abdomen was found to be very obese with a dome like shape abdominal wall and a fatty left lateral segment. First the calibration tubing test was performed and was negative.  Patient denied reflux but the UGI which was reviewed in the OR suggested a sliding hernia.  Using first the Covidien harmonic and then the Ethicon Harmonic I performed a posterior dissection and tried to reduce fat from behind the stomach.  I then approximated the crura with two sutures of 0 surgidek with the Endostitch.   The ViSiGi 36Fr tube was inserted to deflate the stomach and was pulled back into the esophagus.    The pylorus was identified and we measured 5 cm back and marked the antrum.  At that point we began dissection to take down the greater curvature of the stomach using the Harmonic scalpel.  This dissection was taken all the way up to the left crus.  Posterior attachments of the stomach were also taken down.    The ViSiGi tube was then passed into the antrum and suction applied so that it was snug along the lessor curvature.  The "crow's foot" or incisura was identified.  The sleeve gastrectomy was begun using the [employer] stapler beginning with a 4.5 black load followed by two 6 cm black loads and then 6 cm purple loads.  A black load was used near the top where I transected the fat pad. .  When the sleeve was  complete the tube was taken off suction and insufflated briefly.  The tube was withdrawn.  Upper endoscopy was then performed by Dr. Lucia Gaskins.     The specimen was extracted through the 15 trocar site.  Wounds were infiltrated with Exparel and closed with 4-0 vicryl.  0 vicryl with Endoclose was used on the 15 mm port.  Catalina Antigua B. Hassell Done, [place], [facility], [place]

## 2015-01-06 NOTE — Transfer of Care (Signed)
Immediate Anesthesia Transfer of Care Note  Patient: Jonathan Forbes  Procedure(s) Performed: Procedure(s): LAPAROSCOPIC GASTRIC SLEEVE RESECTION WITH HIATAL HERNIA REPAIR UPPER GI ENDOSCOPY  Patient Location: PACU  Anesthesia Type: General  Level of Consciousness: sedated, patient cooperative and responds to stimulation  Airway & Oxygen Therapy: Patient Spontanous Breathing and Patient connected to face mask oxgen  Post-op Assessment: Report given to PACU RN and Post -op Vital signs reviewed and stable  Post vital signs: Reviewed and stable  Complications: No apparent anesthesia complications

## 2015-01-07 ENCOUNTER — Encounter (HOSPITAL_COMMUNITY): Payer: Self-pay | Admitting: Surgery

## 2015-01-07 ENCOUNTER — Inpatient Hospital Stay (HOSPITAL_COMMUNITY): Payer: Medicare Other

## 2015-01-07 LAB — CBC WITH DIFFERENTIAL/PLATELET
BASOS ABS: 0 10*3/uL (ref 0.0–0.1)
Basophils Relative: 0 % (ref 0–1)
Eosinophils Absolute: 0 10*3/uL (ref 0.0–0.7)
Eosinophils Relative: 0 % (ref 0–5)
HCT: 43.6 % (ref 39.0–52.0)
Hemoglobin: 14.6 g/dL (ref 13.0–17.0)
LYMPHS ABS: 1.3 10*3/uL (ref 0.7–4.0)
LYMPHS PCT: 8 % — AB (ref 12–46)
MCH: 29.4 pg (ref 26.0–34.0)
MCHC: 33.5 g/dL (ref 30.0–36.0)
MCV: 87.7 fL (ref 78.0–100.0)
Monocytes Absolute: 1.3 10*3/uL — ABNORMAL HIGH (ref 0.1–1.0)
Monocytes Relative: 8 % (ref 3–12)
NEUTROS ABS: 13 10*3/uL — AB (ref 1.7–7.7)
NEUTROS PCT: 83 % — AB (ref 43–77)
PLATELETS: 128 10*3/uL — AB (ref 150–400)
RBC: 4.97 MIL/uL (ref 4.22–5.81)
RDW: 13 % (ref 11.5–15.5)
WBC: 15.6 10*3/uL — AB (ref 4.0–10.5)

## 2015-01-07 LAB — HEMOGLOBIN AND HEMATOCRIT, BLOOD
HCT: 46.6 % (ref 39.0–52.0)
Hemoglobin: 15.5 g/dL (ref 13.0–17.0)

## 2015-01-07 MED ORDER — IOHEXOL 300 MG/ML  SOLN: 50.0000 mL | Freq: Once | INTRAMUSCULAR | Status: DC | PRN

## 2015-01-07 MED ORDER — IOHEXOL 300 MG/ML  SOLN
50.0000 mL | Freq: Once | INTRAMUSCULAR | Status: AC | PRN
  Administered 2015-01-07: 15 mL via ORAL

## 2015-01-07 MED ORDER — METOPROLOL TARTRATE 1 MG/ML IV SOLN
5.0000 mg | Freq: Four times a day (QID) | INTRAVENOUS | Status: DC
  Administered 2015-01-07 – 2015-01-08 (×5): 5 mg via INTRAVENOUS
  Filled 2015-01-07 (×9): qty 5

## 2015-01-07 NOTE — Progress Notes (Signed)
Patient alert and oriented, Post op day 1.  Provided support and encouragement.  Encouraged pulmonary toilet, ambulation and small sips of liquids when swallow study returned satisfactorily.  All questions answered.  Will continue to monitor. 

## 2015-01-07 NOTE — Plan of Care (Signed)
Problem: Food- and Nutrition-Related Knowledge Deficit (NB-1.1) Goal: Nutrition education Formal process to instruct or train a patient/client in a skill or to impart knowledge to help patients/clients voluntarily manage or modify food choices and eating behavior to maintain or improve health. Outcome: Completed/Met Date Met:  01/07/15 Nutrition Education Note  Received consult for diet education per DROP protocol.   Discussed 2 week post op diet with pt. Emphasized that liquids must be non carbonated, non caffeinated, and sugar free. Fluid goals discussed. Pt to follow up with outpatient bariatric RD for further diet progression after 2 weeks. Multivitamins and minerals also reviewed. Teach back method used, pt expressed understanding, expect good compliance.   Diet: First 2 Weeks  You will see the nutritionist about two (2) weeks after your surgery. The nutritionist will increase the types of foods you can eat if you are handling liquids well:  If you have severe vomiting or nausea and cannot handle clear liquids lasting longer than 1 day, call your surgeon  Protein Shake  Drink at least 2 ounces of shake 5-6 times per day  Each serving of protein shakes (usually 8 - 12 ounces) should have a minimum of:  15 grams of protein  And no more than 5 grams of carbohydrate  Goal for protein each day:  Men = 80 grams per day  Women = 60 grams per day  Protein powder may be added to fluids such as non-fat milk or Lactaid milk or Soy milk (limit to 35 grams added protein powder per serving)   Hydration  Slowly increase the amount of water and other clear liquids as tolerated (See Acceptable Fluids)  Slowly increase the amount of protein shake as tolerated  Sip fluids slowly and throughout the day  May use sugar substitutes in small amounts (no more than 6 - 8 packets per day; i.e. Splenda)   Fluid Goal  The first goal is to drink at least 8 ounces of protein shake/drink per day (or as directed  by the nutritionist); some examples of protein shakes are Syntrax Nectar, Adkins Advantage, EAS Edge HP, and Unjury. See handout from pre-op Bariatric Education Class:  Slowly increase the amount of protein shake you drink as tolerated  You may find it easier to slowly sip shakes throughout the day  It is important to get your proteins in first  Your fluid goal is to drink 64 - 100 ounces of fluid daily  It may take a few weeks to build up to this  32 oz (or more) should be clear liquids  And  32 oz (or more) should be full liquids (see below for examples)  Liquids should not contain sugar, caffeine, or carbonation   Clear Liquids:  Water or Sugar-free flavored water (i.e. Fruit H2O, Propel)  Decaffeinated coffee or tea (sugar-free)  Crystal Lite, Wyler's Lite, Minute Maid Lite  Sugar-free Jell-O  Bouillon or broth  Sugar-free Popsicle: *Less than 20 calories each; Limit 1 per day   Full Liquids:  Protein Shakes/Drinks + 2 choices per day of other full liquids  Full liquids must be:  No More Than 12 grams of Carbs per serving  No More Than 3 grams of Fat per serving  Strained low-fat cream soup  Non-Fat milk  Fat-free Lactaid Milk  Sugar-free yogurt (Dannon Lite & Fit, Greek yogurt)   Mayanna Garlitz MS, RD, LDN [phone]     

## 2015-01-07 NOTE — Progress Notes (Signed)
Patient ID: Jonathan Forbes, male   DOB: 1951/10/13, 64 y.o.   MRN: [id number] [facility] Progress Note:   1 Day Post-Op  Subjective: Mental status is clear Objective: Vital signs in last 24 hours: Temp:  [98 F (36.7 C)-98.8 F (37.1 C)] 98.2 F (36.8 C) (03/15 1400) Pulse Rate:  [73-102] 73 (03/15 1400) Resp:  [18-22] 20 (03/15 1400) BP: (130-179)/(86-96) 178/96 mmHg (03/15 1400) SpO2:  [90 %-96 %] 92 % (03/15 1400)  Intake/Output from previous day: 03/14 0701 - 03/15 0700 In: 2493.3 [I.V.:2493.3] Out: 2150 [Urine:2150] Intake/Output this shift: Total I/O In: 860 [P.O.:60; I.V.:800] Out: 400 [Urine:400]  Physical Exam: Work of breathing is normal.  Complaining of back pain  Lab Results:  Results for orders placed or performed during the hospital encounter of 01/06/15 (from the past 48 hour(s))  CBC     Status: Abnormal   Collection Time: 01/06/15  2:00 PM  Result Value Ref Range   WBC 13.9 (H) 4.0 - 10.5 K/uL   RBC 5.33 4.22 - 5.81 MIL/uL   Hemoglobin 16.2 13.0 - 17.0 g/dL   HCT 47.1 39.0 - 52.0 %   MCV 88.4 78.0 - 100.0 fL   MCH 30.4 26.0 - 34.0 pg   MCHC 34.4 30.0 - 36.0 g/dL   RDW 13.1 11.5 - 15.5 %   Platelets 171 150 - 400 K/uL  Creatinine, serum     Status: Abnormal   Collection Time: 01/06/15  2:00 PM  Result Value Ref Range   Creatinine, Ser 1.01 0.50 - 1.35 mg/dL   GFR calc non Af Amer 77 (L) >90 mL/min   GFR calc Af Amer 89 (L) >90 mL/min    Comment: (NOTE) The eGFR has been calculated using the CKD EPI equation. This calculation has not been validated in all clinical situations. eGFR's persistently <90 mL/min signify possible Chronic Kidney Disease.   CBC WITH DIFFERENTIAL     Status: Abnormal   Collection Time: 01/07/15  4:55 AM  Result Value Ref Range   WBC 15.6 (H) 4.0 - 10.5 K/uL   RBC 4.97 4.22 - 5.81 MIL/uL   Hemoglobin 14.6 13.0 - 17.0 g/dL   HCT 43.6 39.0 - 52.0 %   MCV 87.7 78.0 - 100.0 fL   MCH 29.4 26.0 - 34.0 pg   MCHC  33.5 30.0 - 36.0 g/dL   RDW 13.0 11.5 - 15.5 %   Platelets 128 (L) 150 - 400 K/uL    Comment: DELTA CHECK NOTED REPEATED TO VERIFY    Neutrophils Relative % 83 (H) 43 - 77 %   Neutro Abs 13.0 (H) 1.7 - 7.7 K/uL   Lymphocytes Relative 8 (L) 12 - 46 %   Lymphs Abs 1.3 0.7 - 4.0 K/uL   Monocytes Relative 8 3 - 12 %   Monocytes Absolute 1.3 (H) 0.1 - 1.0 K/uL   Eosinophils Relative 0 0 - 5 %   Eosinophils Absolute 0.0 0.0 - 0.7 K/uL   Basophils Relative 0 0 - 1 %   Basophils Absolute 0.0 0.0 - 0.1 K/uL    Radiology/Results: Dg Ugi W/water Sol Cm  01/07/2015   CLINICAL DATA:  64 year old male status post laparoscopic sleeve gastrectomy. History of hiatal hernia.  EXAM: UPPER GI SERIES WITH KUB  TECHNIQUE: After obtaining a scout radiograph a routine upper GI series was performed using 50 mL of Omnipaque 300.  FLUOROSCOPY TIME:  If the device does not provide the exposure index:  Fluoroscopy Time (in  minutes and seconds):  50 seconds  Number of Acquired Images:  19  COMPARISON:  05/14/2014.  FINDINGS: Preprocedure KUB was unremarkable. Oral contrast was administered, which rapidly passed into the stomach. Stomach had the characteristic appearance of a post sleeve gastrectomy patient, with a residual tubular gastric lumen which was widely patent. Contrast readily traversed the pylorus and proximal small bowel. No extravasation of contrast was noted at any point during the examination. Repeat gastroesophageal reflux was observed.  IMPRESSION: 1. Expected postoperative appearance following sleeve gastrectomy, without evidence of extravasation or obstruction, as above. 2. Gastroesophageal reflux observed repeatedly throughout the examination.   Electronically Signed   By: Vinnie Langton M.D.   On: 01/07/2015 11:13    Anti-infectives: Anti-infectives    Start     Dose/Rate Route Frequency Ordered Stop   01/06/15 0749  cefOXitin (MEFOXIN) 2 g in dextrose 5 % 50 mL IVPB     2 g 100 mL/hr over 30  Minutes Intravenous On call to O.R. 01/06/15 0749 01/06/15 0922      Assessment/Plan: Problem List: Patient Active Problem List   Diagnosis Date Noted  . 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 Osteoarthritis of right knee 05/15/2014  . OSA (obstructive sleep apnea) 04/22/2014  . HTN (hypertension) 12/14/2013  . Meralgia paraesthetica 12/14/2013  . Postoperative anemia due to acute blood loss 12/14/2013  . OA (osteoarthritis) of knee 12/11/2013  . Osteoarthritis of left knee 12/05/2013  . Total knee replacement status 12/05/2013    Swallow OK.  To start PD 1 diet.  1 Day Post-Op    LOS: 1 day   Matt B. Hassell Done, MD, [facility] [phone] beeper [phone]  01/07/2015 3:28 PM

## 2015-01-08 LAB — CBC WITH DIFFERENTIAL/PLATELET
BASOS ABS: 0 10*3/uL (ref 0.0–0.1)
Basophils Relative: 0 % (ref 0–1)
Eosinophils Absolute: 0 10*3/uL (ref 0.0–0.7)
Eosinophils Relative: 0 % (ref 0–5)
HCT: 43.8 % (ref 39.0–52.0)
Hemoglobin: 14.5 g/dL (ref 13.0–17.0)
LYMPHS ABS: 1.6 10*3/uL (ref 0.7–4.0)
Lymphocytes Relative: 12 % (ref 12–46)
MCH: 29.4 pg (ref 26.0–34.0)
MCHC: 33.1 g/dL (ref 30.0–36.0)
MCV: 88.7 fL (ref 78.0–100.0)
MONO ABS: 1.3 10*3/uL — AB (ref 0.1–1.0)
Monocytes Relative: 10 % (ref 3–12)
Neutro Abs: 10.1 10*3/uL — ABNORMAL HIGH (ref 1.7–7.7)
Neutrophils Relative %: 78 % — ABNORMAL HIGH (ref 43–77)
PLATELETS: 108 10*3/uL — AB (ref 150–400)
RBC: 4.94 MIL/uL (ref 4.22–5.81)
RDW: 13.2 % (ref 11.5–15.5)
WBC: 13 10*3/uL — ABNORMAL HIGH (ref 4.0–10.5)

## 2015-01-08 MED ORDER — PANTOPRAZOLE SODIUM 40 MG IV SOLR
40.0000 mg | Freq: Once | INTRAVENOUS | Status: AC
  Administered 2015-01-08: 40 mg via INTRAVENOUS
  Filled 2015-01-08: qty 40

## 2015-01-08 MED ORDER — ALUM & MAG HYDROXIDE-SIMETH 200-200-20 MG/5ML PO SUSP
30.0000 mL | Freq: Once | ORAL | Status: AC
  Administered 2015-01-08: 30 mL via ORAL
  Filled 2015-01-08: qty 30

## 2015-01-08 NOTE — Progress Notes (Signed)
Nurse reviewed discharge instructions with pt.  Pt verbalized understanding of discharge instructions and follow up appointments.  No concerns at time of discharge.   

## 2015-01-08 NOTE — Discharge Summary (Signed)
Physician Discharge Summary  Patient ID: Jonathan Forbes MRN: [id number] DOB/AGE: [birth year] 64 y.o.  Admit date: 01/06/2015 Discharge date: 01/08/2015  Admission Diagnoses:  Morbid obesity  Discharge Diagnoses:  same  Active Problems:   S/P laparoscopic sleeve gastrectomy   Surgery:  Sleeve gastrectomy and repair of hiatus hernia  Discharged Condition: improved  Hospital Course:   Had surgery.  UGI on PD 1 looked OK.  Sore but ready for discharge on PD 2.   Consults: none  Significant Diagnostic Studies: UGI    Discharge Exam: Blood pressure 150/91, pulse 79, temperature 98 F (36.7 C), temperature source Oral, resp. rate 20, height 5\' 11"  (1.803 m), weight 148.2 kg (326 lb 11.6 oz), SpO2 92 %. Incisions ok  Disposition: 06-Home-Health Care Svc  Discharge Instructions    Ambulate hourly while awake    Complete by:  As directed      Call MD for:  difficulty breathing, headache or visual disturbances    Complete by:  As directed      Call MD for:  persistant dizziness or light-headedness    Complete by:  As directed      Call MD for:  persistant nausea and vomiting    Complete by:  As directed      Call MD for:  redness, tenderness, or signs of infection (pain, swelling, redness, odor or green/yellow discharge around incision site)    Complete by:  As directed      Call MD for:  severe uncontrolled pain    Complete by:  As directed      Call MD for:  temperature >101 F    Complete by:  As directed      Diet bariatric full liquid    Complete by:  As directed      Incentive spirometry    Complete by:  As directed   Perform hourly while awake            Medication List    TAKE these medications        amLODipine 10 MG tablet  Commonly known as:  NORVASC  Take 10 mg by mouth every morning.     aspirin EC 325 MG tablet  Take 1 tablet (325 mg total) by mouth 2 (two) times daily. With meals for a month     carvedilol 25 MG tablet  Commonly known as:  COREG   Take 25 mg by mouth 2 (two) times daily with a meal.     celecoxib 200 MG capsule  Commonly known as:  CELEBREX  Take 1 capsule (200 mg total) by mouth daily.     GRX ANALGESIC BALM Oint  Apply 2 g topically 2 (two) times daily.     HYDROcodone-acetaminophen 5-325 MG per tablet  Commonly known as:  NORCO/VICODIN  Take 1 tablet by mouth 3 (three) times daily as needed for moderate pain.     lisinopril 40 MG tablet  Commonly known as:  PRINIVIL,ZESTRIL  Take 40 mg by mouth every morning.     methocarbamol 500 MG tablet  Commonly known as:  ROBAXIN  Take 1 tablet (500 mg total) by mouth every 6 (six) hours as needed for muscle spasms.     Oxycodone HCl 10 MG Tabs  Take 0.5-1 tablets (5-10 mg total) by mouth every 4 (four) hours as needed.     polyethylene glycol packet  Commonly known as:  MIRALAX / GLYCOLAX  Take 17 g by mouth 2 (two) times daily.  senna-docusate 8.6-50 MG per tablet  Commonly known as:  SENOKOT S  Take 2 tablets by mouth 2 (two) times daily.     temazepam 15 MG capsule  Commonly known as:  RESTORIL  Take 1 capsule (15 mg total) by mouth at bedtime as needed for sleep.     traMADol 50 MG tablet  Commonly known as:  ULTRAM  Take 1 tablet (50 mg total) by mouth every 6 (six) hours as needed for moderate pain.     VIIBRYD 40 MG Tabs  Generic drug:  Vilazodone HCl  Take 40 mg by mouth daily.         SignedPedro Earls 01/08/2015, 2:13 PM

## 2015-01-08 NOTE — Progress Notes (Signed)
Patient ID: Jonathan Forbes, male   DOB: Jul 23, 1951, 65 y.o.   MRN: [id number] [facility] Progress Note:   2 Days Post-Op  Subjective: Mental status is clear.  Less sore but moving slowly.   Objective: Vital signs in last 24 hours: Temp:  [98.1 F (36.7 C)-98.8 F (37.1 C)] 98.1 F (36.7 C) (03/16 0549) Pulse Rate:  [65-86] 65 (03/16 0549) Resp:  [20] 20 (03/16 0549) BP: (152-179)/(81-105) 152/81 mmHg (03/16 0549) SpO2:  [90 %-94 %] 90 % (03/16 0549) Weight:  [326 lb 11.6 oz (148.2 kg)] 326 lb 11.6 oz (148.2 kg) (03/16 0145)  Intake/Output from previous day: 03/15 0701 - 03/16 0700 In: 2580 [P.O.:180; I.V.:2400] Out: 1351 [Urine:1350; Stool:1] Intake/Output this shift:    Physical Exam: Work of breathing is normal.  Taking liquids  Lab Results:  Results for orders placed or performed during the hospital encounter of 01/06/15 (from the past 48 hour(s))  CBC     Status: Abnormal   Collection Time: 01/06/15  2:00 PM  Result Value Ref Range   WBC 13.9 (H) 4.0 - 10.5 K/uL   RBC 5.33 4.22 - 5.81 MIL/uL   Hemoglobin 16.2 13.0 - 17.0 g/dL   HCT 47.1 39.0 - 52.0 %   MCV 88.4 78.0 - 100.0 fL   MCH 30.4 26.0 - 34.0 pg   MCHC 34.4 30.0 - 36.0 g/dL   RDW 13.1 11.5 - 15.5 %   Platelets 171 150 - 400 K/uL  Creatinine, serum     Status: Abnormal   Collection Time: 01/06/15  2:00 PM  Result Value Ref Range   Creatinine, Ser 1.01 0.50 - 1.35 mg/dL   GFR calc non Af Amer 77 (L) >90 mL/min   GFR calc Af Amer 89 (L) >90 mL/min    Comment: (NOTE) The eGFR has been calculated using the CKD EPI equation. This calculation has not been validated in all clinical situations. eGFR's persistently <90 mL/min signify possible Chronic Kidney Disease.   CBC WITH DIFFERENTIAL     Status: Abnormal   Collection Time: 01/07/15  4:55 AM  Result Value Ref Range   WBC 15.6 (H) 4.0 - 10.5 K/uL   RBC 4.97 4.22 - 5.81 MIL/uL   Hemoglobin 14.6 13.0 - 17.0 g/dL   HCT 43.6 39.0 - 52.0 %   MCV  87.7 78.0 - 100.0 fL   MCH 29.4 26.0 - 34.0 pg   MCHC 33.5 30.0 - 36.0 g/dL   RDW 13.0 11.5 - 15.5 %   Platelets 128 (L) 150 - 400 K/uL    Comment: DELTA CHECK NOTED REPEATED TO VERIFY    Neutrophils Relative % 83 (H) 43 - 77 %   Neutro Abs 13.0 (H) 1.7 - 7.7 K/uL   Lymphocytes Relative 8 (L) 12 - 46 %   Lymphs Abs 1.3 0.7 - 4.0 K/uL   Monocytes Relative 8 3 - 12 %   Monocytes Absolute 1.3 (H) 0.1 - 1.0 K/uL   Eosinophils Relative 0 0 - 5 %   Eosinophils Absolute 0.0 0.0 - 0.7 K/uL   Basophils Relative 0 0 - 1 %   Basophils Absolute 0.0 0.0 - 0.1 K/uL  Hemoglobin and hematocrit, blood     Status: None   Collection Time: 01/07/15  4:00 PM  Result Value Ref Range   Hemoglobin 15.5 13.0 - 17.0 g/dL   HCT 46.6 39.0 - 52.0 %  CBC with Differential     Status: Abnormal (Preliminary result)  Collection Time: 01/08/15  5:18 AM  Result Value Ref Range   WBC 13.0 (H) 4.0 - 10.5 K/uL   RBC 4.94 4.22 - 5.81 MIL/uL   Hemoglobin 14.5 13.0 - 17.0 g/dL   HCT 43.8 39.0 - 52.0 %   MCV 88.7 78.0 - 100.0 fL   MCH 29.4 26.0 - 34.0 pg   MCHC 33.1 30.0 - 36.0 g/dL   RDW 13.2 11.5 - 15.5 %   Platelets 108 (L) 150 - 400 K/uL    Comment: SPECIMEN CHECKED FOR CLOTS REPEATED TO VERIFY PLATELET COUNT CONFIRMED BY SMEAR    Neutrophils Relative % PENDING 43 - 77 %   Neutro Abs PENDING 1.7 - 7.7 K/uL   Band Neutrophils PENDING 0 - 10 %   Lymphocytes Relative PENDING 12 - 46 %   Lymphs Abs PENDING 0.7 - 4.0 K/uL   Monocytes Relative PENDING 3 - 12 %   Monocytes Absolute PENDING 0.1 - 1.0 K/uL   Eosinophils Relative PENDING 0 - 5 %   Eosinophils Absolute PENDING 0.0 - 0.7 K/uL   Basophils Relative PENDING 0 - 1 %   Basophils Absolute PENDING 0.0 - 0.1 K/uL   WBC Morphology PENDING    RBC Morphology PENDING    Smear Review PENDING    nRBC PENDING 0 /100 WBC   Metamyelocytes Relative PENDING %   Myelocytes PENDING %   Promyelocytes Absolute PENDING %   Blasts PENDING %     Radiology/Results: Dg Ugi W/water Sol Cm  01/07/2015   CLINICAL DATA:  63 year old male status post laparoscopic sleeve gastrectomy. History of hiatal hernia.  EXAM: UPPER GI SERIES WITH KUB  TECHNIQUE: After obtaining a scout radiograph a routine upper GI series was performed using 50 mL of Omnipaque 300.  FLUOROSCOPY TIME:  If the device does not provide the exposure index:  Fluoroscopy Time (in minutes and seconds):  50 seconds  Number of Acquired Images:  19  COMPARISON:  05/14/2014.  FINDINGS: Preprocedure KUB was unremarkable. Oral contrast was administered, which rapidly passed into the stomach. Stomach had the characteristic appearance of a post sleeve gastrectomy patient, with a residual tubular gastric lumen which was widely patent. Contrast readily traversed the pylorus and proximal small bowel. No extravasation of contrast was noted at any point during the examination. Repeat gastroesophageal reflux was observed.  IMPRESSION: 1. Expected postoperative appearance following sleeve gastrectomy, without evidence of extravasation or obstruction, as above. 2. Gastroesophageal reflux observed repeatedly throughout the examination.   Electronically Signed   By: Vinnie Langton M.D.   On: 01/07/2015 11:13    Anti-infectives: Anti-infectives    Start     Dose/Rate Route Frequency Ordered Stop   01/06/15 0749  cefOXitin (MEFOXIN) 2 g in dextrose 5 % 50 mL IVPB     2 g 100 mL/hr over 30 Minutes Intravenous On call to O.R. 01/06/15 0749 01/06/15 0922      Assessment/Plan: Problem List: Patient Active Problem List   Diagnosis Date Noted  . 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 Osteoarthritis of right knee 05/15/2014  . OSA (obstructive sleep apnea) 04/22/2014  . HTN (hypertension) 12/14/2013  . Meralgia paraesthetica 12/14/2013  . Postoperative anemia due to acute blood loss 12/14/2013  . OA (osteoarthritis) of knee 12/11/2013  . Osteoarthritis  of left knee 12/05/2013  . Total knee replacement status 12/05/2013    Pain limiting movement.  Will observe for now.  May be ready for discharge later  today or in AM.   2 Days Post-Op    LOS: 2 days   Matt B. Hassell Done, MD, [facility] [phone] beeper [phone]  01/08/2015 8:21 AM

## 2015-01-08 NOTE — Progress Notes (Signed)
Patient alert and oriented, pain is controlled. Patient is tolerating fluids,  advanced to protein shake today, patient tolerated well.  Reviewed Gastric sleeve discharge instructions with patient and patient is able to articulate understanding.  Provided information on BELT program, Support Group and [facility] outpatient pharmacy. All questions answered, will continue to monitor.  

## 2015-01-08 NOTE — Discharge Instructions (Signed)

## 2015-01-09 ENCOUNTER — Telehealth (HOSPITAL_COMMUNITY): Payer: Self-pay

## 2015-01-09 NOTE — Telephone Encounter (Signed)
Made discharge phone call to patient per DROP protocol. Asking the following questions.    1. Do you have someone to care for you now that you are home?  no 2. Are you having pain now that is not relieved by your pain medication?  no 3. Are you able to drink the recommended daily amount of fluids (48 ounces minimum/day) and protein (60-80 grams/day) as prescribed by the dietitian or nutritional counselor?  Yes, working on it 4. Are you taking the vitamins and minerals as prescribed?  yes 5. Do you have the "on call" number to contact your surgeon if you have a problem or question?  yes 6. Are your incisions free of redness, swelling or drainage? (If steri strips, address that these can fall off, shower as tolerated) yes 7. Have your bowels moved since your surgery?  If not, are you passing gas?  No/yes 8. Are you up and walking 3-4 times per day?  yes    1. Do you have an appointment made to see your surgeon in the next month?  yes 2. Were you provided your discharge medications before your surgery or before you were discharged from the hospital and are you taking them without problem?  yes 3. Were you provided phone numbers to the clinic/surgeon's office?  yes 4. Did you watch the patient education video module in the (clinic, surgeon's office, etc.) before your surgery? yes 5. Do you have a discharge checklist that was provided to you in the hospital to reference with instructions on how to take care of yourself after surgery?  yes 6. Did you see a dietitian or nutritional counselor while you were in the hospital?  yes 7. Do you have an appointment to see a dietitian or nutritional counselor in the next month? yes

## 2015-01-16 ENCOUNTER — Telehealth (HOSPITAL_COMMUNITY): Payer: Self-pay

## 2015-01-16 NOTE — Telephone Encounter (Signed)
Patient called in wanting to eat more solid foods, provided information below in reference to soft proteins.  Patient acknowledged understanding   Protein Continue to INCREASE THE AMOUNT OF PROTEIN SHAKE YOU DRINK AS TOLERATED, working toward your daily protein goal (60-80 grams) Hydration Continue to increase the amount of water and other liquids as tolerated to meet your fluid goals Fluid Goals:  AT LEAST 64 oz. or more of fluid DAILY Sip fluids slowly- DO NOT USE A STRAW Soft Proteins If you feel ready to start incorporating soft solids into your diet!   REMEMBER that your stomach is different, add food very slowly and one at a time! Take bites NO LARGER than a dime, and CHEW CHEW CHEW  **ALL FLUIDS NEED TO BE SUGAR-FREE, CAFFEINE-FREE, AND NON-CARBONATED** IF YOU DO NOT TOLERATED THE SOFT SOLID FOODS, REVERT BACK TO LIQUIDS UNTIL YOU ARE READY  CLEAR LIQUIDS    Water, sugar-free flavored water (i.e. Propel Zero), Decaf coffee or tea , Crystal Light, Wyler's Light, Sugar-free drink mixes, Sugar-free Jell-O , Broth or bullion, Sugar-free popsicle (less than 20 calories,*limit 1 per day)       FULL LIQUIDS   Protein shakes PLUS 2 other choices below:     Limit 2 choices per day No more than 3 g fat or 15 g carbs/serving   Strained, low-fat cream soups, Non-fat/skim milk, unsweetened almond milk,  Low-Fat [place] yogurt (blended) SOFT PROTEINS Eggs (boiled or cooked with cooking spray) Low-Fat Cheese Cottage Cheese Deli Meat Fish or Shellfish (baked, broiled or grilled) REMEMBER:  1 OUNCE OF MEAT = 6-7 GRAMS OF PROTEIN!  (YOUR THUMB IS ABOUT THE SIZE OF AN OUNCE)

## 2015-01-21 ENCOUNTER — Encounter: Payer: Medicare Other | Attending: Surgery

## 2015-01-21 DIAGNOSIS — Z6841 Body Mass Index (BMI) 40.0 and over, adult: Secondary | ICD-10-CM | POA: Insufficient documentation

## 2015-01-21 DIAGNOSIS — Z01818 Encounter for other preprocedural examination: Secondary | ICD-10-CM | POA: Diagnosis not present

## 2015-01-21 DIAGNOSIS — Z713 Dietary counseling and surveillance: Secondary | ICD-10-CM | POA: Insufficient documentation

## 2015-01-21 NOTE — Progress Notes (Signed)
Bariatric Class:  Appt start time: 1530 end time:  1630.  2 Week Post-Operative Nutrition Class  Patient was seen on 01/21/15 for Post-Operative Nutrition education at [facility].   Surgery date: 01/06/15 Surgery type: Gastric sleeve Start weight at [facility]: 332 lbs on 06/10/14 Weight today: 306.5 lbs  Weight change: 23.5 lbs  TANITA  BODY COMP RESULTS  12/16/14 01/21/15   BMI (kg/m^2) 45.4 42.2   Fat Mass (lbs) 142 126.5   Fat Free Mass (lbs) 188 180.0   Total Body Water (lbs) 137.5 132.0    The following the learning objectives were met by the patient during this course:  Identifies Phase 3A (Soft, High Proteins) Dietary Goals and will begin from 2 weeks post-operatively to 2 months post-operatively  Identifies appropriate sources of fluids and proteins   States protein recommendations and appropriate sources post-operatively  Identifies the need for appropriate texture modifications, mastication, and bite sizes when consuming solids  Identifies appropriate multivitamin and calcium sources post-operatively  Describes the need for physical activity post-operatively and will follow MD recommendations  States when to call healthcare provider regarding medication questions or post-operative complications  Handouts given during class include:  Phase 3A: Soft, High Protein Diet Handout  Follow-Up Plan: Patient will follow-up at [facility] in 6 weeks for 2 month post-op nutrition visit for diet advancement per MD.

## 2015-01-28 ENCOUNTER — Other Ambulatory Visit (INDEPENDENT_AMBULATORY_CARE_PROVIDER_SITE_OTHER): Payer: Self-pay

## 2015-03-04 ENCOUNTER — Encounter: Payer: Medicare Other | Attending: Surgery | Admitting: Dietician

## 2015-03-04 DIAGNOSIS — Z6841 Body Mass Index (BMI) 40.0 and over, adult: Secondary | ICD-10-CM | POA: Diagnosis not present

## 2015-03-04 DIAGNOSIS — Z01818 Encounter for other preprocedural examination: Secondary | ICD-10-CM | POA: Diagnosis not present

## 2015-03-04 DIAGNOSIS — Z713 Dietary counseling and surveillance: Secondary | ICD-10-CM | POA: Diagnosis not present

## 2015-03-04 NOTE — Patient Instructions (Addendum)
Goals:  Follow Phase 3B: High Protein + Non-Starchy Vegetables  Eat 3-6 small meals/snacks, every 3-5 hrs  Increase lean protein foods to meet 80g goal  Increase fluid intake to 64oz +  Avoid drinking 15 minutes before, during and 30 minutes after eating  Aim for >30 min of physical activity daily  Surgery date: 01/06/15 Surgery type: Gastric sleeve Start weight at [facility]: 332 lbs on 06/10/14 Weight today: 277 lbs Weight change: 29.5 lbs Total weight lost: 55 lbs Weight loss goal: 230 lbs  TANITA  BODY COMP RESULTS  12/16/14 01/21/15 03/04/15   BMI (kg/m^2) 45.4 42.2 38.1   Fat Mass (lbs) 142 126.5 103   Fat Free Mass (lbs) 188 180.0 174   Total Body Water (lbs) 137.5 132.0 127.5

## 2015-03-04 NOTE — Progress Notes (Signed)
  Follow-up visit:  8 Weeks Post-Operative Gastric Sleeve Surgery  Medical Nutrition Therapy:  Appt start time: 4656 end time:  39  Primary concerns today: Post-operative Bariatric Surgery Nutrition Management.  Jonty returns today having lost another 30 pounds. He is happy with weight loss. He reports he has been having a lot of indigestion and struggling to tolerate meat. Having spinach and kale salads and tolerating.  Surgery date: 01/06/15 Surgery type: Gastric sleeve Start weight at [facility] 332 lbs on 06/10/14 Weight today: 277 lbs Weight change: 29.5 lbs Total weight lost: 55 lbs Weight loss goal: 230 lbs  TANITA  BODY COMP RESULTS  12/16/14 01/21/15 03/04/15   BMI (kg/m^2) 45.4 42.2 38.1   Fat Mass (lbs) 142 126.5 103   Fat Free Mass (lbs) 188 180.0 174   Total Body Water (lbs) 137.5 132.0 127.5    Preferred Learning Style:   No preference indicated   Learning Readiness:  Ready  Dietary Intake:  Patient reports he eats 5x a day and is meeting 80 gm protein goal [place] yogurt, jello, Premier protein shake, shrimp, salads   Fluid intake: insufficient per patient Estimated total protein intake: 80+g per patient  Medications: protonix added Supplementation: taking  Using straws: no Drinking while eating: no Hair loss: yes Carbonated beverages: occasionally diet soda "as a reward" about every 10 days N/V/D/C: some regurgitation and a little constipation Dumping syndrome: none  Recent physical activity:  Walking at least an hour a day every day  Progress Towards Goal(s):  In progress.  Handouts given during visit include:  Phase 3B lean protein + non starchy vegetables  Vegetarian proteins   Nutritional Diagnosis:  [place]-3.3 Overweight/obesity related to past poor dietary habits and physical inactivity as evidenced by patient w/ recent gastric sleeve surgery following dietary guidelines for continued weight loss.     Intervention:  Nutrition counseling  provided.  Teaching Method Utilized:  Visual Auditory  Barriers to learning/adherence to lifestyle change: none  Demonstrated degree of understanding via:  Teach Back   Monitoring/Evaluation:  Dietary intake, exercise, and body weight. Follow up in 2 months for 4 month post-op visit.

## 2015-05-06 ENCOUNTER — Encounter: Payer: Self-pay | Admitting: Dietician

## 2015-05-06 ENCOUNTER — Encounter: Payer: Medicare Other | Attending: Surgery | Admitting: Dietician

## 2015-05-06 DIAGNOSIS — Z713 Dietary counseling and surveillance: Secondary | ICD-10-CM | POA: Insufficient documentation

## 2015-05-06 DIAGNOSIS — Z01818 Encounter for other preprocedural examination: Secondary | ICD-10-CM | POA: Insufficient documentation

## 2015-05-06 DIAGNOSIS — Z6841 Body Mass Index (BMI) 40.0 and over, adult: Secondary | ICD-10-CM | POA: Diagnosis not present

## 2015-05-06 NOTE — Patient Instructions (Addendum)
Goals:  Continue to eat every 3-5 hrs  Increase lean protein foods to meet 80g goal  Increase fluid intake to 64oz +  Avoid drinking 15 minutes before, during and 30 minutes after eating  Aim for >30 min of physical activity daily  Alcohol is not recommended  Surgery date: 01/06/15 Surgery type: Gastric sleeve Start weight at [facility]: 332 lbs on 06/10/14 Weight today: 248.5 lbs Weight change: 28.5 lbs Total weight lost: 83.5 lbs Weight loss goal: 220 lbs  TANITA  BODY COMP RESULTS  12/16/14 01/21/15 03/04/15 05/06/15   BMI (kg/m^2) 45.4 42.2 38.1 34.2   Fat Mass (lbs) 142 126.5 103 83   Fat Free Mass (lbs) 188 180.0 174 165.5   Total Body Water (lbs) 137.5 132.0 127.5 121

## 2015-05-06 NOTE — Progress Notes (Signed)
  Follow-up visit:  4 months Post-Operative Gastric Sleeve Surgery  Medical Nutrition Therapy:  Appt start time: 4818 end time:  1110  Primary concerns today: Post-operative Bariatric Surgery Nutrition Management.  Jonathan Forbes returns today having lost another 28 pounds. Indigestion and upset stomach have improved. Tolerating only small amounts of meat. Having bourbon about 1x a week. "I don't feel like I'm even on a diet."  Surgery date: 01/06/15 Surgery type: Gastric sleeve Start weight at [facility]: 332 lbs on 06/10/14 Weight today: 248.5 lbs Weight change: 28.5 lbs Total weight lost: 83.5 lbs Weight loss goal: 220 lbs  TANITA  BODY COMP RESULTS  12/16/14 01/21/15 03/04/15 05/06/15   BMI (kg/m^2) 45.4 42.2 38.1 34.2   Fat Mass (lbs) 142 126.5 103 83   Fat Free Mass (lbs) 188 180.0 174 165.5   Total Body Water (lbs) 137.5 132.0 127.5 121    Preferred Learning Style:   No preference indicated   Learning Readiness:  Ready  Dietary Intake:   Having "low carb soups" and Smart Ones frozen meals. Eating a lot of pistachios and salad. Has not had any bread or fruit.  Drinks one protein shake per day.  B: soup or Smart Ones meal L: soup or Smart Ones meal S: pistachios, Greek yogurt,  D: soup, Smart Ones meal, or salad   Fluid intake: insufficient per patient Estimated total protein intake: 80+g per patient (needs to drink a shake per day to meet needs)  Medications: protonix added Supplementation: taking  Using straws: no Drinking while eating: no Hair loss: yes Carbonated beverages: occasionally diet soda "as a reward" about every 10 days N/V/D/C: occasional regurgitation with meat Dumping syndrome: none  Recent physical activity:  Elliptical for 30 minutes and light weight lifting circuit daily  Progress Towards Goal(s):  In progress.    Nutritional Diagnosis:  [place]-3.3 Overweight/obesity related to past poor dietary habits and physical inactivity as evidenced by patient w/  recent gastric sleeve surgery following dietary guidelines for continued weight loss.     Intervention:  Nutrition counseling provided.  Teaching Method Utilized:  Visual Auditory  Barriers to learning/adherence to lifestyle change: none  Demonstrated degree of understanding via:  Teach Back   Monitoring/Evaluation:  Dietary intake, exercise, and body weight. Follow up in 3 months for 7 month post-op visit.

## 2015-06-04 IMAGING — RF DG UGI W/ GASTROGRAFIN
14 of 20 series · 14 of 20 positions shown · IV contrast (omnipaque)
Comparison: 05/14/2014.

CLINICAL DATA: 64-year-old male status post laparoscopic sleeve
gastrectomy. History of hiatal hernia.

EXAM:
UPPER GI SERIES WITH KUB
TECHNIQUE: After obtaining a scout radiograph a routine upper GI series was
performed using 50 mL of Omnipaque 300.
FLUOROSCOPY TIME:  If the device does not provide the exposure
index:
Fluoroscopy Time (in minutes and seconds):  50 seconds
Number of Acquired Images:  19

[Series 1: run · 1 of 1 slices shown (1 of 13)]
[im 1/1]
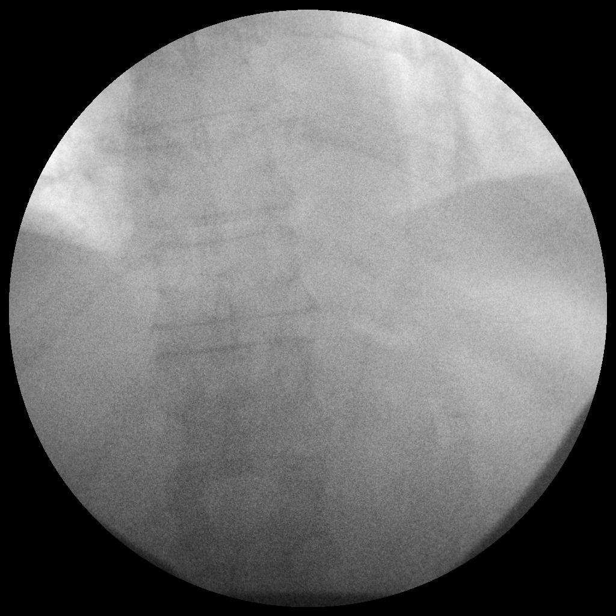

[Series 3: run · 1 of 1 slices shown (2 of 13)]
[im 1/1]
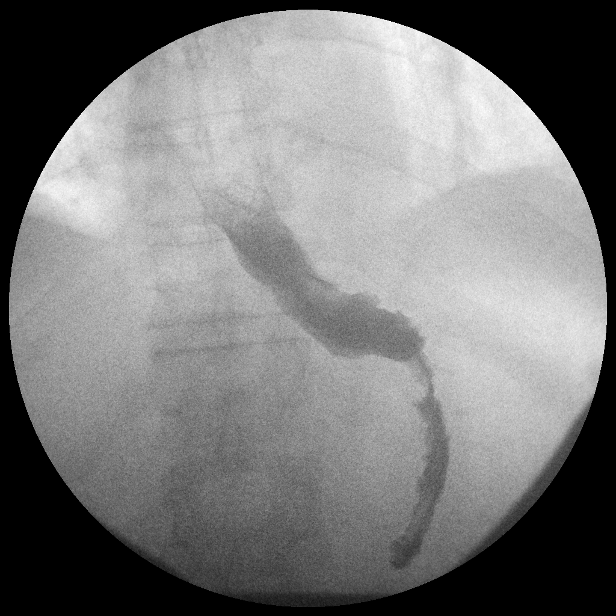

[Series 4: run · 1 of 1 slices shown (3 of 13)]
[im 1/1]
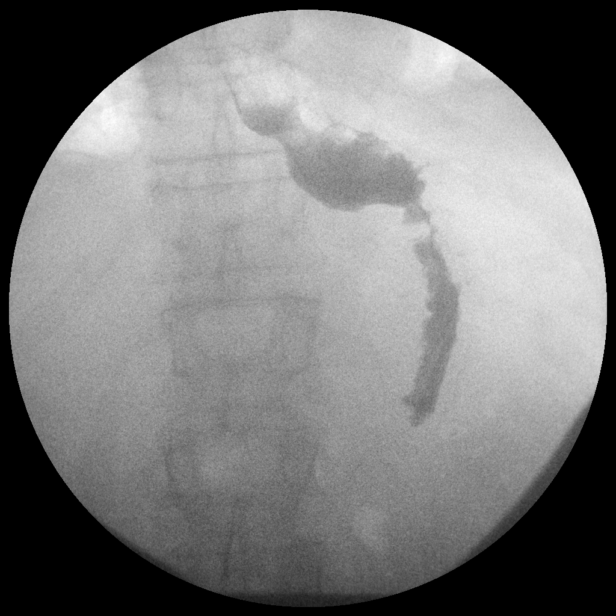

[Series 6: run · 1 of 1 slices shown (4 of 13)]
[im 1/1]
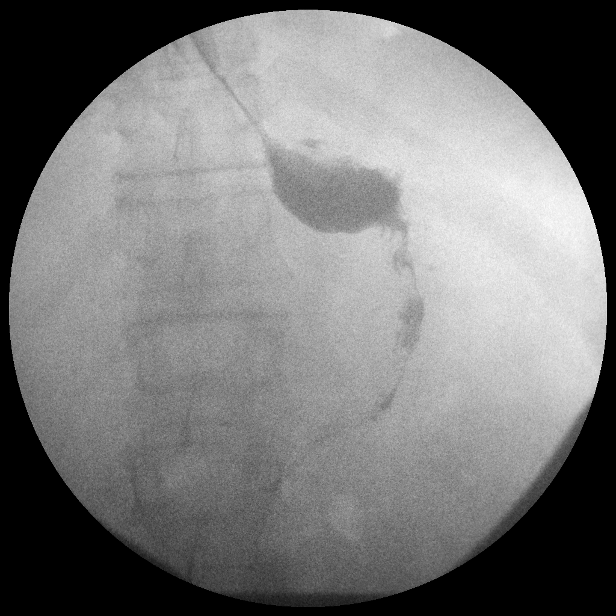

[Series 7: run · 1 of 1 slices shown (5 of 13)]
[im 1/1]
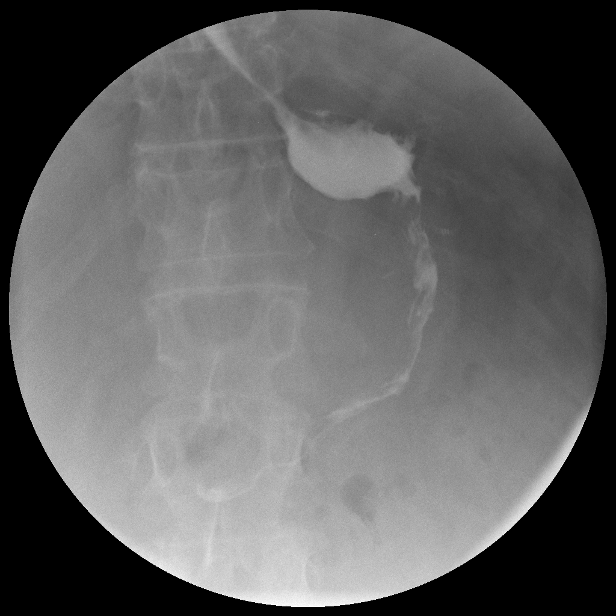

[Series 8: run · 1 of 1 slices shown (6 of 13)]
[im 1/1]
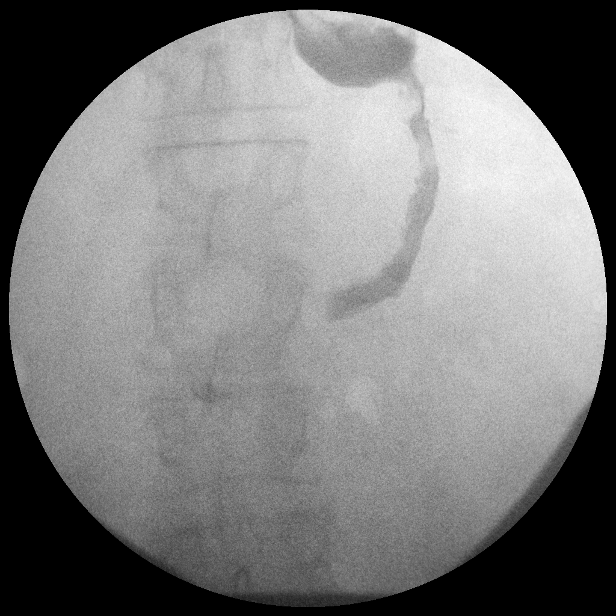

[Series 10: run · 1 of 1 slices shown (7 of 13)]
[im 1/1]
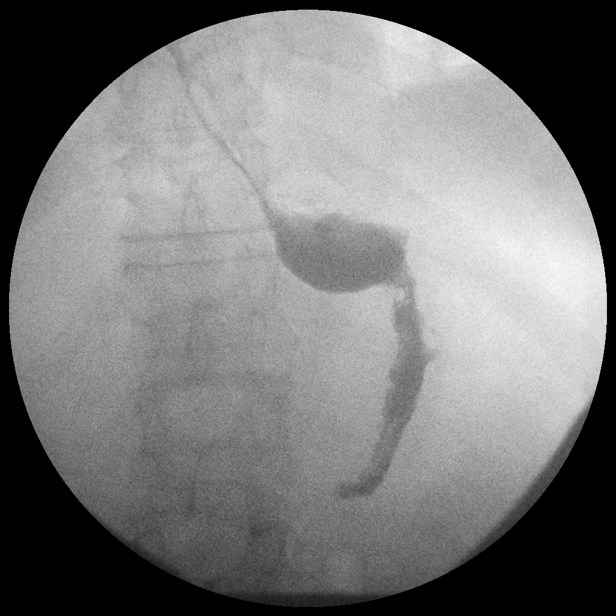

[Series 11: run · 1 of 1 slices shown (8 of 13)]
[im 1/1]
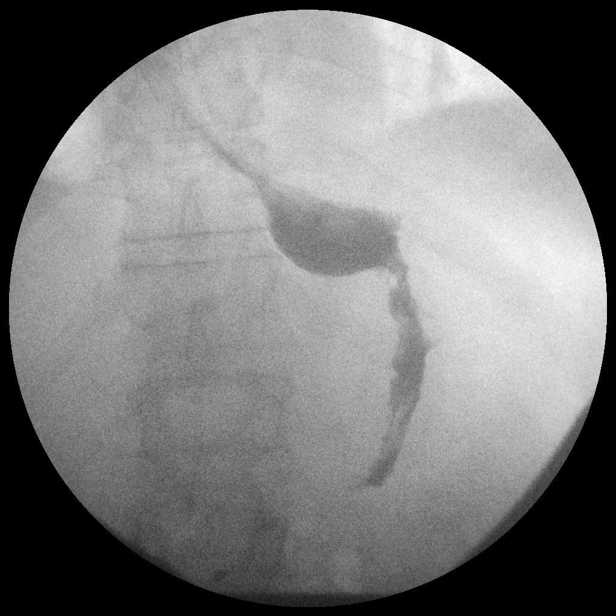

[Series 13: run · 1 of 1 slices shown (9 of 13)]
[im 1/1]
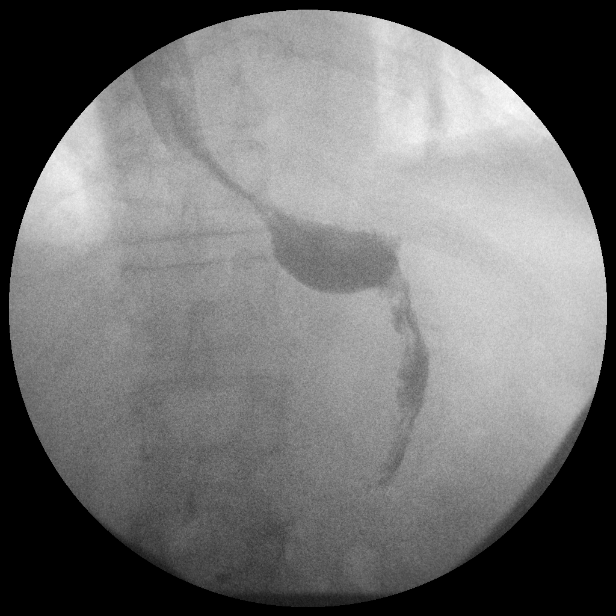

[Series 14: run · 1 of 1 slices shown (10 of 13)]
[im 1/1]
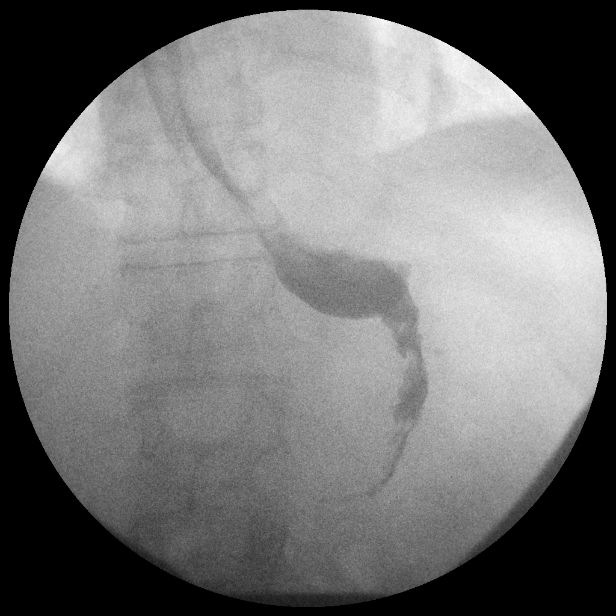

[Series 16: run · 1 of 1 slices shown (11 of 13)]
[im 1/1]
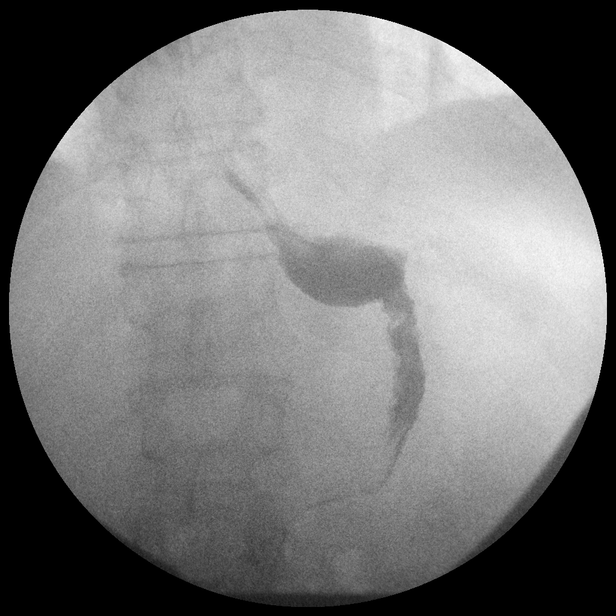

[Series 17: run · 1 of 1 slices shown (12 of 13)]
[im 1/1]
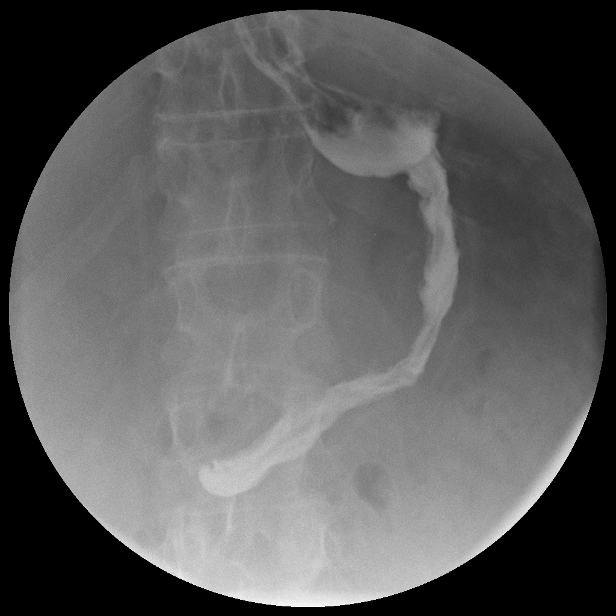

[Series 18: run · 1 of 1 slices shown (13 of 13)]
[im 1/1]
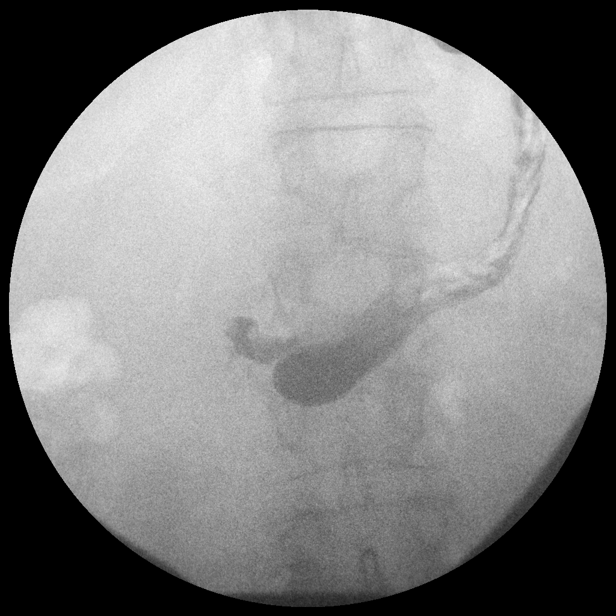

[Series 1002: view not recorded · 0.20mm/px · 1 of 1 slices shown]
[im 1/1]
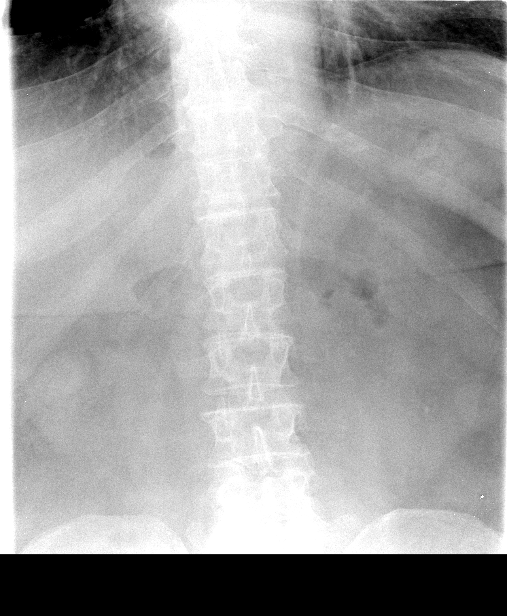

[14 of 20 positions shown; findings below may reference images not displayed]

FINDINGS: Preprocedure KUB was unremarkable. Oral contrast was administered,
which rapidly passed into the stomach. Stomach had the
characteristic appearance of a post sleeve gastrectomy patient, with
a residual tubular gastric lumen which was widely patent. Contrast
readily traversed the pylorus and proximal small bowel. No
extravasation of contrast was noted at any point during the
examination. Repeat gastroesophageal reflux was observed.
IMPRESSION: 1. Expected postoperative appearance following sleeve gastrectomy,
without evidence of extravasation or obstruction, as above.
2. Gastroesophageal reflux observed repeatedly throughout the
examination.

## 2015-07-25 ENCOUNTER — Other Ambulatory Visit (HOSPITAL_COMMUNITY): Payer: Self-pay | Admitting: Orthopaedic Surgery

## 2015-07-25 DIAGNOSIS — M25561 Pain in right knee: Secondary | ICD-10-CM

## 2015-07-25 DIAGNOSIS — M25562 Pain in left knee: Principal | ICD-10-CM

## 2015-08-05 ENCOUNTER — Ambulatory Visit: Payer: Medicare Other | Admitting: Dietician

## 2015-08-08 ENCOUNTER — Encounter (HOSPITAL_COMMUNITY): Payer: Medicare Other

## 2015-08-18 ENCOUNTER — Encounter (HOSPITAL_COMMUNITY)
Admission: RE | Admit: 2015-08-18 | Discharge: 2015-08-18 | Disposition: A | Discharge status: Home / Self Care | Payer: Medicare Other | Source: Ambulatory Visit | Attending: Orthopaedic Surgery | Admitting: Orthopaedic Surgery

## 2015-08-18 DIAGNOSIS — M25561 Pain in right knee: Secondary | ICD-10-CM | POA: Insufficient documentation

## 2015-08-18 DIAGNOSIS — M25562 Pain in left knee: Secondary | ICD-10-CM | POA: Diagnosis not present

## 2015-08-18 DIAGNOSIS — Z96653 Presence of artificial knee joint, bilateral: Secondary | ICD-10-CM | POA: Insufficient documentation

## 2015-08-18 DIAGNOSIS — R6889 Other general symptoms and signs: Secondary | ICD-10-CM | POA: Diagnosis not present

## 2015-08-18 MED ORDER — TECHNETIUM TC 99M MEDRONATE IV KIT
25.0000 | PACK | Freq: Once | INTRAVENOUS | Status: AC | PRN
  Administered 2015-08-18: 25.7 via INTRAVENOUS

## 2015-08-22 ENCOUNTER — Other Ambulatory Visit (HOSPITAL_BASED_OUTPATIENT_CLINIC_OR_DEPARTMENT_OTHER): Payer: Self-pay | Admitting: Orthopaedic Surgery

## 2015-08-25 ENCOUNTER — Encounter (HOSPITAL_BASED_OUTPATIENT_CLINIC_OR_DEPARTMENT_OTHER): Payer: Self-pay | Admitting: *Deleted

## 2015-08-25 ENCOUNTER — Encounter (HOSPITAL_BASED_OUTPATIENT_CLINIC_OR_DEPARTMENT_OTHER)
Admission: RE | Admit: 2015-08-25 | Discharge: 2015-08-25 | Disposition: A | Discharge status: Home / Self Care | Payer: Medicare Other | Source: Ambulatory Visit | Attending: Orthopaedic Surgery | Admitting: Orthopaedic Surgery

## 2015-08-25 DIAGNOSIS — Z85828 Personal history of other malignant neoplasm of skin: Secondary | ICD-10-CM | POA: Diagnosis not present

## 2015-08-25 DIAGNOSIS — Z96653 Presence of artificial knee joint, bilateral: Secondary | ICD-10-CM | POA: Insufficient documentation

## 2015-08-25 DIAGNOSIS — G4733 Obstructive sleep apnea (adult) (pediatric): Secondary | ICD-10-CM | POA: Diagnosis not present

## 2015-08-25 DIAGNOSIS — M1990 Unspecified osteoarthritis, unspecified site: Secondary | ICD-10-CM | POA: Insufficient documentation

## 2015-08-25 DIAGNOSIS — Z9842 Cataract extraction status, left eye: Secondary | ICD-10-CM | POA: Diagnosis not present

## 2015-08-25 DIAGNOSIS — Z79899 Other long term (current) drug therapy: Secondary | ICD-10-CM | POA: Diagnosis not present

## 2015-08-25 DIAGNOSIS — K449 Diaphragmatic hernia without obstruction or gangrene: Secondary | ICD-10-CM | POA: Diagnosis not present

## 2015-08-25 DIAGNOSIS — M25562 Pain in left knee: Secondary | ICD-10-CM | POA: Insufficient documentation

## 2015-08-25 DIAGNOSIS — M65862 Other synovitis and tenosynovitis, left lower leg: Secondary | ICD-10-CM | POA: Diagnosis not present

## 2015-08-25 DIAGNOSIS — L905 Scar conditions and fibrosis of skin: Secondary | ICD-10-CM | POA: Insufficient documentation

## 2015-08-25 DIAGNOSIS — Z9884 Bariatric surgery status: Secondary | ICD-10-CM | POA: Insufficient documentation

## 2015-08-25 DIAGNOSIS — F329 Major depressive disorder, single episode, unspecified: Secondary | ICD-10-CM | POA: Diagnosis not present

## 2015-08-25 DIAGNOSIS — T8489XA Other specified complication of internal orthopedic prosthetic devices, implants and grafts, initial encounter: Secondary | ICD-10-CM | POA: Diagnosis not present

## 2015-08-25 DIAGNOSIS — Z6827 Body mass index (BMI) 27.0-27.9, adult: Secondary | ICD-10-CM | POA: Insufficient documentation

## 2015-08-25 DIAGNOSIS — R634 Abnormal weight loss: Secondary | ICD-10-CM | POA: Diagnosis not present

## 2015-08-25 DIAGNOSIS — Z9841 Cataract extraction status, right eye: Secondary | ICD-10-CM | POA: Diagnosis not present

## 2015-08-25 DIAGNOSIS — I340 Nonrheumatic mitral (valve) insufficiency: Secondary | ICD-10-CM | POA: Diagnosis not present

## 2015-08-25 DIAGNOSIS — I10 Essential (primary) hypertension: Secondary | ICD-10-CM | POA: Insufficient documentation

## 2015-08-25 LAB — BASIC METABOLIC PANEL
Anion gap: 7 (ref 5–15)
BUN: 12 mg/dL (ref 6–20)
CHLORIDE: 109 mmol/L (ref 101–111)
CO2: 28 mmol/L (ref 22–32)
Calcium: 9.3 mg/dL (ref 8.9–10.3)
Creatinine, Ser: 0.84 mg/dL (ref 0.61–1.24)
GFR calc non Af Amer: >60 mL/min (ref >60)
Glucose, Bld: 141 mg/dL — ABNORMAL HIGH (ref 65–99)
POTASSIUM: 4.3 mmol/L (ref 3.5–5.1)
SODIUM: 144 mmol/L (ref 135–145)

## 2015-08-26 NOTE — Anesthesia Preprocedure Evaluation (Addendum)
Anesthesia Evaluation  Patient identified by MRN, date of birth, ID band Patient awake    Reviewed: Allergy & Precautions, NPO status , Patient's Chart, lab work & pertinent test results  Airway Mallampati: I  TM Distance: >3 FB Neck ROM: Full    Dental  (+) Teeth Intact, Dental Advisory Given   Pulmonary neg sleep apnea,    breath sounds clear to auscultation       Cardiovascular hypertension, Pt. on medications  Rhythm:Regular Rate:Normal     Neuro/Psych PSYCHIATRIC DISORDERS Depression  Neuromuscular disease    GI/Hepatic Neg liver ROS, hiatal hernia, S/p Gastric Sleeve Resection   Endo/Other  negative endocrine ROS  Renal/GU negative Renal ROS  negative genitourinary   Musculoskeletal  (+) Arthritis ,   Abdominal   Peds negative pediatric ROS (+)  Hematology negative hematology ROS (+)   Anesthesia Other Findings   Reproductive/Obstetrics negative OB ROS                           Lab Results  Component Value Date   WBC 13.0* 01/08/2015   HGB 14.5 01/08/2015   HCT 43.8 01/08/2015   MCV 88.7 01/08/2015   PLT 108* 01/08/2015   Lab Results  Component Value Date   CREATININE 0.84 08/25/2015   BUN 12 08/25/2015   NA 144 08/25/2015   K 4.3 08/25/2015   CL 109 08/25/2015   CO2 28 08/25/2015   Lab Results  Component Value Date   INR 1.07 05/09/2014   INR 0.98 11/29/2013   EKG: normal sinus rhythm.    Anesthesia Physical Anesthesia Plan  ASA: II  Anesthesia Plan: General   Post-op Pain Management:    Induction: Intravenous  Airway Management Planned: LMA  Additional Equipment:   Intra-op Plan:   Post-operative Plan: Extubation in OR  Informed Consent: I have reviewed the patients History and Physical, chart, labs and discussed the procedure including the risks, benefits and alternatives for the proposed anesthesia with the patient or authorized representative  who has indicated his/her understanding and acceptance.     Plan Discussed with: CRNA, Anesthesiologist and Surgeon  Anesthesia Plan Comments: (Possibel ETT if patient has complaint of GERD after gastric resection surgery. )       Anesthesia Quick Evaluation

## 2015-08-27 ENCOUNTER — Encounter (HOSPITAL_BASED_OUTPATIENT_CLINIC_OR_DEPARTMENT_OTHER)
Admission: RE | Disposition: A | Discharge status: Home / Self Care | Payer: Self-pay | Source: Ambulatory Visit | Attending: Orthopaedic Surgery

## 2015-08-27 ENCOUNTER — Ambulatory Visit (HOSPITAL_BASED_OUTPATIENT_CLINIC_OR_DEPARTMENT_OTHER): Payer: Medicare Other | Admitting: Anesthesiology

## 2015-08-27 ENCOUNTER — Ambulatory Visit (HOSPITAL_BASED_OUTPATIENT_CLINIC_OR_DEPARTMENT_OTHER)
Admission: RE | Admit: 2015-08-27 | Discharge: 2015-08-27 | Disposition: A | Discharge status: Home / Self Care | Payer: Medicare Other | Source: Ambulatory Visit | Attending: Orthopaedic Surgery | Admitting: Orthopaedic Surgery

## 2015-08-27 ENCOUNTER — Encounter (HOSPITAL_BASED_OUTPATIENT_CLINIC_OR_DEPARTMENT_OTHER): Payer: Self-pay | Admitting: *Deleted

## 2015-08-27 DIAGNOSIS — L905 Scar conditions and fibrosis of skin: Secondary | ICD-10-CM | POA: Diagnosis not present

## 2015-08-27 DIAGNOSIS — T8489XA Other specified complication of internal orthopedic prosthetic devices, implants and grafts, initial encounter: Secondary | ICD-10-CM | POA: Diagnosis not present

## 2015-08-27 DIAGNOSIS — M65862 Other synovitis and tenosynovitis, left lower leg: Secondary | ICD-10-CM | POA: Diagnosis not present

## 2015-08-27 DIAGNOSIS — Z96653 Presence of artificial knee joint, bilateral: Secondary | ICD-10-CM | POA: Diagnosis not present

## 2015-08-27 HISTORY — PX: KNEE ARTHROSCOPY: SHX127

## 2015-08-27 SURGERY — ARTHROSCOPY, KNEE
Anesthesia: General | Site: Knee | Laterality: Left

## 2015-08-27 MED ORDER — PROPOFOL 500 MG/50ML IV EMUL
INTRAVENOUS | Status: AC
  Filled 2015-08-27: qty 50

## 2015-08-27 MED ORDER — BUPIVACAINE HCL (PF) 0.5 % IJ SOLN
INTRAMUSCULAR | Status: AC
  Filled 2015-08-27: qty 30

## 2015-08-27 MED ORDER — GLYCOPYRROLATE 0.2 MG/ML IJ SOLN: 0.2000 mg | Freq: Once | INTRAMUSCULAR | Status: DC | PRN

## 2015-08-27 MED ORDER — FENTANYL CITRATE (PF) 100 MCG/2ML IJ SOLN
INTRAMUSCULAR | Status: AC
  Filled 2015-08-27: qty 4

## 2015-08-27 MED ORDER — DEXAMETHASONE SODIUM PHOSPHATE 10 MG/ML IJ SOLN
INTRAMUSCULAR | Status: DC | PRN
  Administered 2015-08-27: 10 mg via INTRAVENOUS

## 2015-08-27 MED ORDER — SODIUM CHLORIDE 0.9 % IR SOLN
Status: DC | PRN
  Administered 2015-08-27: 6000 mL

## 2015-08-27 MED ORDER — CEFAZOLIN SODIUM-DEXTROSE 2-3 GM-% IV SOLR
INTRAVENOUS | Status: AC
  Filled 2015-08-27: qty 50

## 2015-08-27 MED ORDER — HYDROMORPHONE HCL 1 MG/ML IJ SOLN
INTRAMUSCULAR | Status: AC
  Filled 2015-08-27: qty 1

## 2015-08-27 MED ORDER — MEPERIDINE HCL 25 MG/ML IJ SOLN: 6.2500 mg | INTRAMUSCULAR | Status: DC | PRN

## 2015-08-27 MED ORDER — EPINEPHRINE HCL 1 MG/ML IJ SOLN
INTRAMUSCULAR | Status: AC
  Filled 2015-08-27: qty 1

## 2015-08-27 MED ORDER — HYDROMORPHONE HCL 1 MG/ML IJ SOLN
0.2500 mg | INTRAMUSCULAR | Status: DC | PRN
  Administered 2015-08-27 (×2): 0.5 mg via INTRAVENOUS

## 2015-08-27 MED ORDER — LIDOCAINE HCL (PF) 1 % IJ SOLN
INTRAMUSCULAR | Status: AC
  Filled 2015-08-27: qty 30

## 2015-08-27 MED ORDER — MIDAZOLAM HCL 2 MG/2ML IJ SOLN
1.0000 mg | INTRAMUSCULAR | Status: DC | PRN
  Administered 2015-08-27: 2 mg via INTRAVENOUS

## 2015-08-27 MED ORDER — HYDROCODONE-ACETAMINOPHEN 5-325 MG PO TABS: 1.0000 | ORAL_TABLET | Freq: Four times a day (QID) | ORAL | Status: DC | PRN

## 2015-08-27 MED ORDER — FENTANYL CITRATE (PF) 100 MCG/2ML IJ SOLN
50.0000 ug | INTRAMUSCULAR | Status: AC | PRN
  Administered 2015-08-27: 50 ug via INTRAVENOUS
  Administered 2015-08-27: 25 ug via INTRAVENOUS
  Administered 2015-08-27: 50 ug via INTRAVENOUS

## 2015-08-27 MED ORDER — BUPIVACAINE HCL (PF) 0.5 % IJ SOLN
INTRAMUSCULAR | Status: DC | PRN
  Administered 2015-08-27: 15 mL

## 2015-08-27 MED ORDER — ONDANSETRON HCL 4 MG/2ML IJ SOLN
INTRAMUSCULAR | Status: AC
  Filled 2015-08-27: qty 2

## 2015-08-27 MED ORDER — CEFAZOLIN SODIUM-DEXTROSE 2-3 GM-% IV SOLR
2.0000 g | INTRAVENOUS | Status: AC
  Administered 2015-08-27: 2 g via INTRAVENOUS

## 2015-08-27 MED ORDER — LACTATED RINGERS IV SOLN
INTRAVENOUS | Status: DC
  Administered 2015-08-27 (×2): via INTRAVENOUS

## 2015-08-27 MED ORDER — PROPOFOL 10 MG/ML IV BOLUS
INTRAVENOUS | Status: DC | PRN
  Administered 2015-08-27: 200 mg via INTRAVENOUS

## 2015-08-27 MED ORDER — SCOPOLAMINE 1 MG/3DAYS TD PT72: 1.0000 | MEDICATED_PATCH | Freq: Once | TRANSDERMAL | Status: DC | PRN

## 2015-08-27 MED ORDER — DEXAMETHASONE SODIUM PHOSPHATE 10 MG/ML IJ SOLN
INTRAMUSCULAR | Status: AC
  Filled 2015-08-27: qty 1

## 2015-08-27 MED ORDER — LIDOCAINE HCL (CARDIAC) 20 MG/ML IV SOLN
INTRAVENOUS | Status: AC
  Filled 2015-08-27: qty 5

## 2015-08-27 MED ORDER — MIDAZOLAM HCL 2 MG/2ML IJ SOLN
INTRAMUSCULAR | Status: AC
  Filled 2015-08-27: qty 4

## 2015-08-27 MED ORDER — EPHEDRINE SULFATE 50 MG/ML IJ SOLN
INTRAMUSCULAR | Status: DC | PRN
  Administered 2015-08-27: 10 mg via INTRAVENOUS

## 2015-08-27 MED ORDER — ONDANSETRON HCL 4 MG/2ML IJ SOLN
INTRAMUSCULAR | Status: DC | PRN
  Administered 2015-08-27: 4 mg via INTRAVENOUS

## 2015-08-27 MED ORDER — LIDOCAINE HCL 2 % IJ SOLN
INTRAMUSCULAR | Status: AC
  Filled 2015-08-27: qty 20

## 2015-08-27 MED ORDER — LACTATED RINGERS IV SOLN: INTRAVENOUS | Status: DC

## 2015-08-27 MED ORDER — BUPIVACAINE-EPINEPHRINE (PF) 0.5% -1:200000 IJ SOLN
INTRAMUSCULAR | Status: AC
  Filled 2015-08-27: qty 30

## 2015-08-27 MED ORDER — LIDOCAINE HCL (CARDIAC) 20 MG/ML IV SOLN
INTRAVENOUS | Status: DC | PRN
  Administered 2015-08-27: 50 mg via INTRAVENOUS

## 2015-08-27 MED ORDER — PROMETHAZINE HCL 25 MG/ML IJ SOLN: 6.2500 mg | INTRAMUSCULAR | Status: DC | PRN

## 2015-08-27 MED ORDER — EPHEDRINE SULFATE 50 MG/ML IJ SOLN
INTRAMUSCULAR | Status: AC
  Filled 2015-08-27: qty 1

## 2015-08-27 SURGICAL SUPPLY — 32 items
BANDAGE ELASTIC 6 VELCRO ST LF (GAUZE/BANDAGES/DRESSINGS) ×8 IMPLANT
BLADE 4.2CUDA (BLADE) ×4 IMPLANT
BLADE GREAT WHITE 4.2 (BLADE) ×3 IMPLANT
BLADE GREAT WHITE 4.2MM (BLADE) ×1
BLADE SURG 11 STRL SS (BLADE) ×4 IMPLANT
CUFF TOURNIQUET SINGLE 34IN LL (TOURNIQUET CUFF) ×4 IMPLANT
DRAPE ARTHROSCOPY W/POUCH 90 (DRAPES) ×4 IMPLANT
DRAPE SURG 17X23 STRL (DRAPES) ×8 IMPLANT
DURAPREP 26ML APPLICATOR (WOUND CARE) ×4 IMPLANT
GAUZE SPONGE 4X4 12PLY STRL (GAUZE/BANDAGES/DRESSINGS) ×4 IMPLANT
GAUZE XEROFORM 1X8 LF (GAUZE/BANDAGES/DRESSINGS) ×4 IMPLANT
GLOVE BIOGEL PI IND STRL 7.0 (GLOVE) ×4 IMPLANT
GLOVE BIOGEL PI INDICATOR 7.0 (GLOVE) ×4
GLOVE ECLIPSE 6.5 STRL STRAW (GLOVE) ×4 IMPLANT
GLOVE NEODERM STRL 7.5 LF PF (GLOVE) ×2 IMPLANT
GLOVE SURG NEODERM 7.5  LF PF (GLOVE) ×2
GLOVE SURG SYN 7.5  E (GLOVE) ×2
GLOVE SURG SYN 7.5 E (GLOVE) ×2 IMPLANT
GOWN STRL REIN XL XLG (GOWN DISPOSABLE) ×4 IMPLANT
GOWN STRL REUS W/ TWL LRG LVL3 (GOWN DISPOSABLE) ×2 IMPLANT
GOWN STRL REUS W/TWL LRG LVL3 (GOWN DISPOSABLE) ×2
KNEE WRAP E Z 3 GEL PACK (MISCELLANEOUS) ×4 IMPLANT
MANIFOLD NEPTUNE II (INSTRUMENTS) ×4 IMPLANT
PACK ARTHROSCOPY DSU (CUSTOM PROCEDURE TRAY) ×4 IMPLANT
PACK BASIN DAY SURGERY FS (CUSTOM PROCEDURE TRAY) ×4 IMPLANT
RESECTOR FULL RADIUS 4.2MM (BLADE) IMPLANT
SET ARTHROSCOPY TUBING (MISCELLANEOUS) ×2
SET ARTHROSCOPY TUBING LN (MISCELLANEOUS) ×2 IMPLANT
SLEEVE SCD COMPRESS KNEE MED (MISCELLANEOUS) ×4 IMPLANT
SUT ETHILON 3 0 PS 1 (SUTURE) ×4 IMPLANT
TOWEL OR 17X24 6PK STRL BLUE (TOWEL DISPOSABLE) ×4 IMPLANT
WATER STERILE IRR 1000ML POUR (IV SOLUTION) ×4 IMPLANT

## 2015-08-27 NOTE — Op Note (Signed)
   Date of surgery: 08/27/2015  Preoperative diagnoses: Left painful knee status post total knee arthroplasty  Postoperative diagnosis: Same  Procedure: Left knee arthroscopy with extensive synovectomy  Surgeon: Eduard Roux, M.D.  Anesthesia: Gen.  Estimated blood loss: Minimal  Tourniquet time: Less than 20 minutes  Complications: None  Operative findings: Extensive synovectomy with focal scar tissue and synovitis of the superior lateral patellar region and retinaculum  Indications for procedure: The patient is a 64 year old gentleman status post left total knee arthroplasty approximately a year ago. He subsequently underwent gastric bypass surgery and lost a tremendous amount of weight. He also developed patellofemoral issues due to his quadriceps wasting and deconditioning. He also had a focal area of crepitus and pain was felt to be due to scar tissue at the superior lateral region of the patella.  Workup of the left knee pain was negative for infection or loosening. He was indicated for diagnostic arthroscopy and debridement as indicated. He is aware the risks benefits alternatives to surgery and he wished to proceed. Consent was obtained.  Description of procedure: Patient was identified in the preoperative holding area. The operative site was marked by the surgeon confirmed with the patient. He is brought back to the operating room. His placed supine on table. Also tourniquet was placed. General anesthesia was induced. Preoperative antibiotics were given.Timeout was performed. Leg was elevated for exsanguination and tourniquet was inflated to 300 mmHg. The standard anterolateral anteromedial arthroscopy portals were established.There was extensive amount of scar tissue and synovitis. This was debrided in order to obtain visualization.  The knee replacement did appear to be without any problems. We then turned our attention to the patellofemoral joint. In particular we visualized the  superior lateral region of the patella and this demonstrated a large amount of scar tissue in that region that was felt to be responsible for the patient's symptoms. We then created a third portal in the superomedial region in order to debride the scar tissue.This was done with an oscillating shaver. We then assessed the patella femoral tracking which appeared normal. Final arthroscopy pictures were taken. Local anesthesia was infiltrated in the portal sites. Incisions were closed with 3-0 nylon. Sterile dressings were applied. Tourniquet was inflated. Patient tolerated the procedure well was extubated and transferred to PACU in stable condition.  Disposition: Patient will be weight bear as tolerated to the left lower extremity. We'll see him back in the office in 2 weeks for suture removal.  N. Eduard Roux, MD [place] 9:34 AM

## 2015-08-27 NOTE — H&P (Signed)
PREOPERATIVE H&P  Chief Complaint: Left knee pain, scar tissue post total knee arthroplasty  HPI: Jonathan Forbes is a 64 y.o. male who presents for surgical treatment of Left knee pain, scar tissue post total knee arthroplasty.  He denies any changes in medical history.  Past Medical History  Diagnosis Date  . Depression   . Glaucoma     Hx; of beginning stages  . Eczema     Hx; of  . Mitral valve regurgitation     Hx; of slight  . OA (osteoarthritis) of knee     "both" (05/16/2014)  . Skin cancer 08/2013    "back"  . History of hiatal hernia   . Hypertension     off all BP meds after bariatric surgery  . OSA (obstructive sleep apnea)     no CPAP   Past Surgical History  Procedure Laterality Date  . Tumor excision      skin cancer of right side of back-wound vac  . Cataract extraction w/ intraocular lens  implant, bilateral Bilateral   . Colonoscopy      Hx; of  . Total knee arthroplasty Left 12/05/2013    Procedure: LEFT TOTAL KNEE ARTHROPLASTY;  Surgeon: Marianna Payment, MD;  Location: [facility];  Service: Orthopedics;  Laterality: Left;  . Breath tek h pylori N/A 05/14/2014    Procedure: BREATH TEK H PYLORI;  Surgeon: Pedro Earls, MD;  Location: [facility];  Service: General;  Laterality: N/A;  . Total knee arthroplasty Right 05/15/2014    Procedure: RIGHT TOTAL KNEE ARTHROPLASTY;  Surgeon: Marianna Payment, MD;  Location: [facility];  Service: Orthopedics;  Laterality: Right;  . Skin cancer excision  08/2013    "off my back; not melanoma"  . Tonsillectomy  as child  . Laparoscopic gastric sleeve resection with hiatal hernia repair  01/06/2015    Procedure: LAPAROSCOPIC GASTRIC SLEEVE RESECTION WITH HIATAL HERNIA REPAIR;  Surgeon: Johnathan Hausen, MD;  Location: [facility];  Service: General;;  . Upper gi endoscopy  01/06/2015    Procedure: UPPER GI ENDOSCOPY;  Surgeon: Johnathan Hausen, MD;  Location: [facility];  Service: General;;  . Joint replacement Bilateral   .  Hernia repair     Social History   Social History  . Marital Status: Divorced    Spouse Name: N/A  . Number of Children: N/A  . Years of Education: N/A   Social History Main Topics  . Smoking status: Never Smoker   . Smokeless tobacco: Never Used  . Alcohol Use: Yes     Comment: occasional burbon  . Drug Use: No  . Sexual Activity: Not Currently   Other Topics Concern  . None   Social History Narrative   Family History  Problem Relation Age of Onset  . Heart disease Father   . Hypertension Father    No Known Allergies Prior to Admission medications   Medication Sig Start Date End Date Taking? Authorizing Provider  calcium-vitamin D (OSCAL WITH D) 500-200 MG-UNIT tablet Take 1 tablet by mouth.   Yes Historical Provider, MD  cholecalciferol (VITAMIN D) 400 UNITS TABS tablet Take 400 Units by mouth.   Yes Historical Provider, MD  cyanocobalamin 500 MCG tablet Take 500 mcg by mouth daily.   Yes Historical Provider, MD  Melatonin 1 MG TABS Take by mouth.   Yes Historical Provider, MD  Multiple Vitamin (MULTIVITAMIN WITH MINERALS) TABS tablet Take 1 tablet by mouth daily.   Yes Historical Provider,  MD  traZODone (DESYREL) 50 MG tablet Take 50 mg by mouth at bedtime.   Yes Historical Provider, MD  Vilazodone HCl (VIIBRYD) 40 MG TABS Take 40 mg by mouth daily.   Yes Historical Provider, MD  amLODipine (NORVASC) 10 MG tablet Take 10 mg by mouth every morning.     Historical Provider, MD  carvedilol (COREG) 25 MG tablet Take 25 mg by mouth 2 (two) times daily with a meal. Pt not taking    Historical Provider, MD  lisinopril (PRINIVIL,ZESTRIL) 40 MG tablet Take 40 mg by mouth every morning.     Historical Provider, MD     Positive ROS: All other systems have been reviewed and were otherwise negative with the exception of those mentioned in the HPI and as above.  Physical Exam: General: Alert, no acute distress Cardiovascular: No pedal edema Respiratory: No cyanosis, no use of  accessory musculature GI: abdomen soft Skin: No lesions in the area of chief complaint Neurologic: Sensation intact distally Psychiatric: Patient is competent for consent with normal mood and affect Lymphatic: no lymphedema  MUSCULOSKELETAL: exam stable  Assessment: Left knee pain, scar tissue post total knee arthroplasty  Plan: Plan for Procedure(s): LEFT KNEE ARTHROSCOPY WITH SYNOVECTOMY  The risks benefits and alternatives were discussed with the patient including but not limited to the risks of nonoperative treatment, versus surgical intervention including infection, bleeding, nerve injury,  blood clots, cardiopulmonary complications, morbidity, mortality, among others, and they were willing to proceed.   Marianna Payment, MD   08/27/2015 8:19 AM

## 2015-08-27 NOTE — Anesthesia Procedure Notes (Signed)
Procedure Name: LMA Insertion Date/Time: 08/27/2015 8:32 AM Performed by: Lieutenant Diego Pre-anesthesia Checklist: Patient identified, Emergency Drugs available, Suction available and Patient being monitored Patient Re-evaluated:Patient Re-evaluated prior to inductionOxygen Delivery Method: Circle System Utilized Preoxygenation: Pre-oxygenation with 100% oxygen Intubation Type: IV induction Ventilation: Mask ventilation without difficulty LMA: LMA inserted LMA Size: 5.0 Number of attempts: 1 Airway Equipment and Method: Bite block Placement Confirmation: positive ETCO2 and breath sounds checked- equal and bilateral Tube secured with: Tape Dental Injury: Teeth and Oropharynx as per pre-operative assessment

## 2015-08-27 NOTE — Anesthesia Postprocedure Evaluation (Signed)
  Anesthesia Post-op Note  Patient: Jonathan Forbes  Procedure(s) Performed: Procedure(s): LEFT KNEE ARTHROSCOPY WITH SYNOVECTOMY (Left)  Patient Location: PACU  Anesthesia Type: General   Level of Consciousness: awake, alert  and oriented  Airway and Oxygen Therapy: Patient Spontanous Breathing  Post-op Pain: none  Post-op Assessment: Post-op Vital signs reviewed  Post-op Vital Signs: Reviewed  Last Vitals:  Filed Vitals:   08/27/15 1016  BP: 155/86  Pulse: 70  Temp: 36.7 C  Resp: 16    Complications: No apparent anesthesia complications

## 2015-08-27 NOTE — Discharge Instructions (Signed)
Post-operative patient instructions  Knee Arthroscopy    Ice:  Place intermittent ice or cooler pack over your knee, 30 minutes on and 30 minutes off.  Continue this for the first 72 hours after surgery, then save ice for use after therapy sessions or on more active days.    Weight:  You may bear weight on your leg as your symptoms allow.  Crutches:  Use crutches (or walker) to assist in walking until told to discontinue by your physical therapist or physician. This will help to reduce pain.  Strengthening:  Perform simple thigh squeezes (isometric quad contractions) and straight leg lifts as you are able (3 sets of 5 to 10 repetitions, 3 times a day).  For the leg lifts, have someone support under your ankle in the beginning until you have increased strength enough to do this on your own.  To help get started on thigh squeezes, place a pillow under your knee and push down on the pillow with back of knee (sometimes easier to do than with your leg fully straight).  Motion:  Perform gentle knee motion as tolerated - this is gentle bending and straightening of the knee. Seated heel slides: you can start by sitting in a chair, remove your brace, and gently slide your heel back on the floor - allowing your knee to bend. Have someone help you straighten your knee (or use your other leg/foot hooked under your ankle.   Dressing:  Perform 1st dressing change at 2 days postoperative. A moderate amount of blood tinged drainage is to be expected.  So if you bleed through the dressing on the first or second day or if you have fevers, it is fine to change the dressing/check the wounds early and redress wound. Elevate your leg.  If it bleeds through again, or if the incisions are leaking frank blood, please call the office. May change dressing every 1-2 days thereafter to help watch wounds. Can purchase Tegderm (or 63M Nexcare) water resistant dressings at local pharmacy / [employer].  Shower:  Light shower is ok after  2 days.  Please take shower, NO bath. Recover with gauze and ace wrap to help keep wounds protected.    Pain medication:  A narcotic pain medication has been prescribed.  Take as directed.  Typically you need narcotic pain medication more regularly during the first 3 to 5 days after surgery.  Decrease your use of the medication as the pain improves.  Narcotics can sometimes cause constipation, even after a few doses.  If you have problems with constipation, you can take an over the counter stool softener or light laxative.  If you have persistent problems, please notify your physicians office.  Physical therapy: Additional activity guidelines to be provided by your physician or physical therapist at follow-up visits.   Driving: Do not recommend driving x 2 weeks post surgical, especially if surgery performed on right side. Should not drive while taking narcotic pain medications. It typically takes at least 2 weeks to restore sufficient neuromuscular function for normal reaction times for driving safety.   Call [phone] for questions or problems. Evenings you will be forwarded to the hospital operator.  Ask for the orthopaedic physician on call. Please call if you experience:    o Redness, foul smelling, or persistent drainage from the surgical site  o worsening knee pain and swelling not responsive to medication  o any calf pain and or swelling of the lower leg  o temperatures greater than 100.5 F  o other questions or concerns   Thank you for allowing [place] to be a part of your care.     Post Anesthesia Home Care Instructions  Activity: Get plenty of rest for the remainder of the day. A responsible adult should stay with you for 24 hours following the procedure.  For the next 24 hours, DO NOT: -Drive a car -Paediatric nurse -Drink alcoholic beverages -Take any medication unless instructed by your physician -Make any legal decisions or sign important papers.  Meals: Start with  liquid foods such as gelatin or soup. Progress to regular foods as tolerated. Avoid greasy, spicy, heavy foods. If nausea and/or vomiting occur, drink only clear liquids until the nausea and/or vomiting subsides. Call your physician if vomiting continues.  Special Instructions/Symptoms: Your throat may feel dry or sore from the anesthesia or the breathing tube placed in your throat during surgery. If this causes discomfort, gargle with warm salt water. The discomfort should disappear within 24 hours.  If you had a scopolamine patch placed behind your ear for the management of post- operative nausea and/or vomiting:  1. The medication in the patch is effective for 72 hours, after which it should be removed.  Wrap patch in a tissue and discard in the trash. Wash hands thoroughly with soap and water. 2. You may remove the patch earlier than 72 hours if you experience unpleasant side effects which may include dry mouth, dizziness or visual disturbances. 3. Avoid touching the patch. Wash your hands with soap and water after contact with the patch.

## 2015-08-27 NOTE — Transfer of Care (Signed)
Immediate Anesthesia Transfer of Care Note  Patient: Jonathan Forbes  Procedure(s) Performed: Procedure(s): LEFT KNEE ARTHROSCOPY WITH SYNOVECTOMY (Left)  Patient Location: PACU  Anesthesia Type:General  Level of Consciousness: awake, alert  and oriented  Airway & Oxygen Therapy: Patient Spontanous Breathing and Patient connected to face mask oxygen  Post-op Assessment: Report given to RN and Post -op Vital signs reviewed and stable  Post vital signs: Reviewed and stable  Last Vitals:  Filed Vitals:   08/27/15 0746  BP: 165/101  Pulse: 66  Temp: 36.7 C  Resp: 20    Complications: No apparent anesthesia complications

## 2015-08-28 ENCOUNTER — Encounter (HOSPITAL_BASED_OUTPATIENT_CLINIC_OR_DEPARTMENT_OTHER): Payer: Self-pay | Admitting: Orthopaedic Surgery

## 2015-09-02 ENCOUNTER — Encounter (HOSPITAL_BASED_OUTPATIENT_CLINIC_OR_DEPARTMENT_OTHER): Payer: Self-pay | Admitting: Orthopaedic Surgery

## 2015-11-06 ENCOUNTER — Other Ambulatory Visit (HOSPITAL_BASED_OUTPATIENT_CLINIC_OR_DEPARTMENT_OTHER): Payer: Self-pay | Admitting: Orthopaedic Surgery

## 2015-11-06 ENCOUNTER — Encounter (HOSPITAL_BASED_OUTPATIENT_CLINIC_OR_DEPARTMENT_OTHER): Payer: Self-pay | Admitting: *Deleted

## 2015-11-12 ENCOUNTER — Ambulatory Visit (HOSPITAL_BASED_OUTPATIENT_CLINIC_OR_DEPARTMENT_OTHER): Payer: Medicare Other | Admitting: Anesthesiology

## 2015-11-12 ENCOUNTER — Encounter (HOSPITAL_BASED_OUTPATIENT_CLINIC_OR_DEPARTMENT_OTHER)
Admission: RE | Disposition: A | Discharge status: Home / Self Care | Payer: Self-pay | Source: Ambulatory Visit | Attending: Orthopaedic Surgery

## 2015-11-12 ENCOUNTER — Encounter (HOSPITAL_BASED_OUTPATIENT_CLINIC_OR_DEPARTMENT_OTHER): Payer: Self-pay | Admitting: Anesthesiology

## 2015-11-12 ENCOUNTER — Ambulatory Visit (HOSPITAL_BASED_OUTPATIENT_CLINIC_OR_DEPARTMENT_OTHER)
Admission: RE | Admit: 2015-11-12 | Discharge: 2015-11-12 | Disposition: A | Discharge status: Home / Self Care | Payer: Medicare Other | Source: Ambulatory Visit | Attending: Orthopaedic Surgery | Admitting: Orthopaedic Surgery

## 2015-11-12 DIAGNOSIS — I10 Essential (primary) hypertension: Secondary | ICD-10-CM | POA: Diagnosis not present

## 2015-11-12 DIAGNOSIS — Z96653 Presence of artificial knee joint, bilateral: Secondary | ICD-10-CM | POA: Insufficient documentation

## 2015-11-12 DIAGNOSIS — M25561 Pain in right knee: Secondary | ICD-10-CM | POA: Diagnosis not present

## 2015-11-12 DIAGNOSIS — Z85828 Personal history of other malignant neoplasm of skin: Secondary | ICD-10-CM | POA: Diagnosis not present

## 2015-11-12 DIAGNOSIS — N2881 Hypertrophy of kidney: Secondary | ICD-10-CM | POA: Diagnosis present

## 2015-11-12 HISTORY — PX: KNEE ARTHROSCOPY: SHX127

## 2015-11-12 SURGERY — ARTHROSCOPY, KNEE
Anesthesia: General | Site: Knee | Laterality: Right

## 2015-11-12 MED ORDER — DEXAMETHASONE SODIUM PHOSPHATE 10 MG/ML IJ SOLN
INTRAMUSCULAR | Status: AC
  Filled 2015-11-12: qty 1

## 2015-11-12 MED ORDER — ATROPINE SULFATE 0.4 MG/ML IJ SOLN
INTRAMUSCULAR | Status: AC
  Filled 2015-11-12: qty 1

## 2015-11-12 MED ORDER — ONDANSETRON HCL 4 MG/2ML IJ SOLN
INTRAMUSCULAR | Status: AC
  Filled 2015-11-12: qty 2

## 2015-11-12 MED ORDER — CEFAZOLIN SODIUM-DEXTROSE 2-3 GM-% IV SOLR
INTRAVENOUS | Status: AC
  Filled 2015-11-12: qty 50

## 2015-11-12 MED ORDER — SUCCINYLCHOLINE CHLORIDE 20 MG/ML IJ SOLN
INTRAMUSCULAR | Status: AC
  Filled 2015-11-12: qty 1

## 2015-11-12 MED ORDER — DEXAMETHASONE SODIUM PHOSPHATE 4 MG/ML IJ SOLN
INTRAMUSCULAR | Status: DC | PRN
  Administered 2015-11-12: 10 mg via INTRAVENOUS

## 2015-11-12 MED ORDER — FENTANYL CITRATE (PF) 100 MCG/2ML IJ SOLN
INTRAMUSCULAR | Status: AC
  Filled 2015-11-12: qty 2

## 2015-11-12 MED ORDER — PROMETHAZINE HCL 25 MG/ML IJ SOLN: 6.2500 mg | INTRAMUSCULAR | Status: DC | PRN

## 2015-11-12 MED ORDER — MIDAZOLAM HCL 2 MG/2ML IJ SOLN
INTRAMUSCULAR | Status: AC
  Filled 2015-11-12: qty 2

## 2015-11-12 MED ORDER — EPHEDRINE SULFATE 50 MG/ML IJ SOLN
INTRAMUSCULAR | Status: DC | PRN
  Administered 2015-11-12: 10 mg via INTRAVENOUS

## 2015-11-12 MED ORDER — EPHEDRINE SULFATE 50 MG/ML IJ SOLN
INTRAMUSCULAR | Status: AC
  Filled 2015-11-12: qty 1

## 2015-11-12 MED ORDER — GLYCOPYRROLATE 0.2 MG/ML IJ SOLN: 0.2000 mg | Freq: Once | INTRAMUSCULAR | Status: DC | PRN

## 2015-11-12 MED ORDER — BUPIVACAINE HCL (PF) 0.25 % IJ SOLN
INTRAMUSCULAR | Status: AC
  Filled 2015-11-12: qty 30

## 2015-11-12 MED ORDER — SODIUM CHLORIDE 0.9 % IR SOLN
Status: DC | PRN
  Administered 2015-11-12: 7000 mL

## 2015-11-12 MED ORDER — PROPOFOL 10 MG/ML IV BOLUS
INTRAVENOUS | Status: AC
  Filled 2015-11-12: qty 40

## 2015-11-12 MED ORDER — LIDOCAINE HCL (CARDIAC) 20 MG/ML IV SOLN
INTRAVENOUS | Status: AC
  Filled 2015-11-12: qty 5

## 2015-11-12 MED ORDER — BUPIVACAINE HCL (PF) 0.5 % IJ SOLN
INTRAMUSCULAR | Status: AC
  Filled 2015-11-12: qty 30

## 2015-11-12 MED ORDER — PHENYLEPHRINE HCL 10 MG/ML IJ SOLN
INTRAMUSCULAR | Status: AC
  Filled 2015-11-12: qty 1

## 2015-11-12 MED ORDER — HYDROCODONE-ACETAMINOPHEN 7.5-325 MG PO TABS: 1.0000 | ORAL_TABLET | Freq: Four times a day (QID) | ORAL | Status: DC | PRN

## 2015-11-12 MED ORDER — KETOROLAC TROMETHAMINE 30 MG/ML IJ SOLN
INTRAMUSCULAR | Status: DC | PRN
  Administered 2015-11-12: 30 mg via INTRAVENOUS

## 2015-11-12 MED ORDER — CEFAZOLIN SODIUM-DEXTROSE 2-3 GM-% IV SOLR
2.0000 g | INTRAVENOUS | Status: AC
  Administered 2015-11-12: 2 g via INTRAVENOUS

## 2015-11-12 MED ORDER — ONDANSETRON HCL 4 MG/2ML IJ SOLN
INTRAMUSCULAR | Status: DC | PRN
  Administered 2015-11-12: 4 mg via INTRAVENOUS

## 2015-11-12 MED ORDER — MIDAZOLAM HCL 5 MG/5ML IJ SOLN
INTRAMUSCULAR | Status: DC | PRN
  Administered 2015-11-12: 2 mg via INTRAVENOUS

## 2015-11-12 MED ORDER — GLYCOPYRROLATE 0.2 MG/ML IJ SOLN
INTRAMUSCULAR | Status: AC
  Filled 2015-11-12: qty 1

## 2015-11-12 MED ORDER — LIDOCAINE HCL (CARDIAC) 20 MG/ML IV SOLN
INTRAVENOUS | Status: DC | PRN
  Administered 2015-11-12: 50 mg via INTRAVENOUS

## 2015-11-12 MED ORDER — FENTANYL CITRATE (PF) 100 MCG/2ML IJ SOLN: 50.0000 ug | INTRAMUSCULAR | Status: DC | PRN

## 2015-11-12 MED ORDER — FENTANYL CITRATE (PF) 100 MCG/2ML IJ SOLN: 25.0000 ug | INTRAMUSCULAR | Status: DC | PRN

## 2015-11-12 MED ORDER — FENTANYL CITRATE (PF) 100 MCG/2ML IJ SOLN
INTRAMUSCULAR | Status: DC | PRN
  Administered 2015-11-12: 50 ug via INTRAVENOUS
  Administered 2015-11-12: 100 ug via INTRAVENOUS
  Administered 2015-11-12: 50 ug via INTRAVENOUS

## 2015-11-12 MED ORDER — PROPOFOL 10 MG/ML IV BOLUS
INTRAVENOUS | Status: DC | PRN
  Administered 2015-11-12: 200 mg via INTRAVENOUS

## 2015-11-12 MED ORDER — MIDAZOLAM HCL 2 MG/2ML IJ SOLN: 1.0000 mg | INTRAMUSCULAR | Status: DC | PRN

## 2015-11-12 MED ORDER — LACTATED RINGERS IV SOLN
INTRAVENOUS | Status: DC
  Administered 2015-11-12 (×2): via INTRAVENOUS

## 2015-11-12 MED ORDER — BUPIVACAINE HCL (PF) 0.25 % IJ SOLN
INTRAMUSCULAR | Status: DC | PRN
  Administered 2015-11-12: 20 mL

## 2015-11-12 MED ORDER — PHENYLEPHRINE HCL 10 MG/ML IJ SOLN
INTRAMUSCULAR | Status: DC | PRN
  Administered 2015-11-12: 40 ug via INTRAVENOUS

## 2015-11-12 MED ORDER — SCOPOLAMINE 1 MG/3DAYS TD PT72: 1.0000 | MEDICATED_PATCH | Freq: Once | TRANSDERMAL | Status: DC

## 2015-11-12 SURGICAL SUPPLY — 41 items
BANDAGE ACE 6X5 VEL STRL LF (GAUZE/BANDAGES/DRESSINGS) ×8 IMPLANT
BANDAGE ESMARK 6X9 LF (GAUZE/BANDAGES/DRESSINGS) IMPLANT
BLADE 4.2CUDA (BLADE) IMPLANT
BLADE CUDA GRT WHITE 3.5 (BLADE) IMPLANT
BLADE CUDA SHAVER 3.5 (BLADE) IMPLANT
BLADE CUTTER GATOR 3.5 (BLADE) IMPLANT
BLADE GREAT WHITE 4.2 (BLADE) ×3 IMPLANT
BLADE GREAT WHITE 4.2MM (BLADE) ×1
BNDG ESMARK 6X9 LF (GAUZE/BANDAGES/DRESSINGS)
CUFF TOURNIQUET SINGLE 34IN LL (TOURNIQUET CUFF) ×4 IMPLANT
DRAPE ARTHROSCOPY W/POUCH 90 (DRAPES) ×4 IMPLANT
DRAPE SURG 17X23 STRL (DRAPES) ×8 IMPLANT
DURAPREP 26ML APPLICATOR (WOUND CARE) ×4 IMPLANT
ELECT MENISCUS 165MM 90D (ELECTRODE) IMPLANT
ELECT REM PT RETURN 9FT ADLT (ELECTROSURGICAL)
ELECTRODE REM PT RTRN 9FT ADLT (ELECTROSURGICAL) IMPLANT
GAUZE SPONGE 4X4 12PLY STRL (GAUZE/BANDAGES/DRESSINGS) ×4 IMPLANT
GAUZE XEROFORM 1X8 LF (GAUZE/BANDAGES/DRESSINGS) ×4 IMPLANT
GLOVE BIOGEL PI IND STRL 7.0 (GLOVE) ×4 IMPLANT
GLOVE BIOGEL PI INDICATOR 7.0 (GLOVE) ×4
GLOVE ECLIPSE 6.5 STRL STRAW (GLOVE) ×4 IMPLANT
GLOVE SKINSENSE NS SZ7.5 (GLOVE) ×2
GLOVE SKINSENSE STRL SZ7.5 (GLOVE) ×2 IMPLANT
GLOVE SURG SYN 7.5  E (GLOVE) ×2
GLOVE SURG SYN 7.5 E (GLOVE) ×2 IMPLANT
GOWN STRL REIN XL XLG (GOWN DISPOSABLE) ×4 IMPLANT
GOWN STRL REUS W/ TWL LRG LVL3 (GOWN DISPOSABLE) ×2 IMPLANT
GOWN STRL REUS W/TWL LRG LVL3 (GOWN DISPOSABLE) ×2
KNEE WRAP E Z 3 GEL PACK (MISCELLANEOUS) ×4 IMPLANT
MANIFOLD NEPTUNE II (INSTRUMENTS) ×4 IMPLANT
PACK ARTHROSCOPY DSU (CUSTOM PROCEDURE TRAY) ×4 IMPLANT
PACK BASIN DAY SURGERY FS (CUSTOM PROCEDURE TRAY) ×4 IMPLANT
PENCIL BUTTON HOLSTER BLD 10FT (ELECTRODE) IMPLANT
RESECTOR FULL RADIUS 4.2MM (BLADE) IMPLANT
SET ARTHROSCOPY TUBING (MISCELLANEOUS) ×2
SET ARTHROSCOPY TUBING LN (MISCELLANEOUS) ×2 IMPLANT
SLEEVE SCD COMPRESS KNEE MED (MISCELLANEOUS) ×4 IMPLANT
SUT ETHILON 3 0 PS 1 (SUTURE) ×4 IMPLANT
TOWEL OR 17X24 6PK STRL BLUE (TOWEL DISPOSABLE) ×4 IMPLANT
TOWEL OR NON WOVEN STRL DISP B (DISPOSABLE) IMPLANT
WATER STERILE IRR 1000ML POUR (IV SOLUTION) ×4 IMPLANT

## 2015-11-12 NOTE — Transfer of Care (Signed)
Immediate Anesthesia Transfer of Care Note  Patient: Jonathan Forbes  Procedure(s) Performed: Procedure(s): RIGHT KNEE ARTHROSCOPY WITH DEBRIDEMENT (Right)  Patient Location: PACU  Anesthesia Type:General  Level of Consciousness: awake and patient cooperative  Airway & Oxygen Therapy: Patient Spontanous Breathing and Patient connected to face mask oxygen  Post-op Assessment: Report given to RN and Post -op Vital signs reviewed and stable  Post vital signs: Reviewed and stable  Last Vitals:  Filed Vitals:   11/12/15 1047 11/12/15 1048  BP: 142/90   Pulse:  95  Temp:    Resp:  25    Complications: No apparent anesthesia complications

## 2015-11-12 NOTE — Discharge Instructions (Signed)
° ° °Post-operative patient instructions  °Knee Arthroscopy  ° °• Ice:  Place intermittent ice or cooler pack over your knee, 30 minutes on and 30 minutes off.  Continue this for the first 72 hours after surgery, then save ice for use after therapy sessions or on more active days.   °• Weight:  You may bear weight on your leg as your symptoms allow. °• Crutches:  Use crutches (or walker) to assist in walking until told to discontinue by your physical therapist or physician. This will help to reduce pain. °• Strengthening:  Perform simple thigh squeezes (isometric quad contractions) and straight leg lifts as you are able (3 sets of 5 to 10 repetitions, 3 times a day).  For the leg lifts, have someone support under your ankle in the beginning until you have increased strength enough to do this on your own.  To help get started on thigh squeezes, place a pillow under your knee and push down on the pillow with back of knee (sometimes easier to do than with your leg fully straight). °• Motion:  Perform gentle knee motion as tolerated - this is gentle bending and straightening of the knee. Seated heel slides: you can start by sitting in a chair, remove your brace, and gently slide your heel back on the floor - allowing your knee to bend. Have someone help you straighten your knee (or use your other leg/foot hooked under your ankle.  °• Dressing:  Perform 1st dressing change at 2 days postoperative. A moderate amount of blood tinged drainage is to be expected.  So if you bleed through the dressing on the first or second day or if you have fevers, it is fine to change the dressing/check the wounds early and redress wound. Elevate your leg.  If it bleeds through again, or if the incisions are leaking frank blood, please call the office. May change dressing every 1-2 days thereafter to help watch wounds. Can purchase Tegderm (or 3M Nexcare) water resistant dressings at local pharmacy / [employer]. °• Shower:  Light shower is ok  after 2 days.  Please take shower, NO bath. Recover with gauze and ace wrap to help keep wounds protected.   °• Pain medication:  A narcotic pain medication has been prescribed.  Take as directed.  Typically you need narcotic pain medication more regularly during the first 3 to 5 days after surgery.  Decrease your use of the medication as the pain improves.  Narcotics can sometimes cause constipation, even after a few doses.  If you have problems with constipation, you can take an over the counter stool softener or light laxative.  If you have persistent problems, please notify your physician’s office. °• Physical therapy: Additional activity guidelines to be provided by your physician or physical therapist at follow-up visits.  °• Driving: Do not recommend driving x 2 weeks post surgical, especially if surgery performed on right side. Should not drive while taking narcotic pain medications. It typically takes at least 2 weeks to restore sufficient neuromuscular function for normal reaction times for driving safety.  °• Call [phone] for questions or problems. Evenings you will be forwarded to the hospital operator.  Ask for the orthopaedic physician on call. Please call if you experience:  °  °o Redness, foul smelling, or persistent drainage from the surgical site  °o worsening knee pain and swelling not responsive to medication  °o any calf pain and or swelling of the lower leg  °o temperatures greater than   100.5 F o other questions or concerns   Thank you for allowing [place] to be a part of your care.  Post Anesthesia Home Care Instructions  Activity: Get plenty of rest for the remainder of the day. A responsible adult should stay with you for 24 hours following the procedure.  For the next 24 hours, DO NOT: -Drive a car -Paediatric nurse -Drink alcoholic beverages -Take any medication unless instructed by your physician -Make any legal decisions or sign important papers.  Meals: Start with  liquid foods such as gelatin or soup. Progress to regular foods as tolerated. Avoid greasy, spicy, heavy foods. If nausea and/or vomiting occur, drink only clear liquids until the nausea and/or vomiting subsides. Call your physician if vomiting continues.  Special Instructions/Symptoms: Your throat may feel dry or sore from the anesthesia or the breathing tube placed in your throat during surgery. If this causes discomfort, gargle with warm salt water. The discomfort should disappear within 24 hours.  If you had a scopolamine patch placed behind your ear for the management of post- operative nausea and/or vomiting:  1. The medication in the patch is effective for 72 hours, after which it should be removed.  Wrap patch in a tissue and discard in the trash. Wash hands thoroughly with soap and water. 2. You may remove the patch earlier than 72 hours if you experience unpleasant side effects which may include dry mouth, dizziness or visual disturbances. 3. Avoid touching the patch. Wash your hands with soap and water after contact with the patch.     Post Anesthesia Home Care Instructions  Activity: Get plenty of rest for the remainder of the day. A responsible adult should stay with you for 24 hours following the procedure.  For the next 24 hours, DO NOT: -Drive a car -Paediatric nurse -Drink alcoholic beverages -Take any medication unless instructed by your physician -Make any legal decisions or sign important papers.  Meals: Start with liquid foods such as gelatin or soup. Progress to regular foods as tolerated. Avoid greasy, spicy, heavy foods. If nausea and/or vomiting occur, drink only clear liquids until the nausea and/or vomiting subsides. Call your physician if vomiting continues.  Special Instructions/Symptoms: Your throat may feel dry or sore from the anesthesia or the breathing tube placed in your throat during surgery. If this causes discomfort, gargle with warm salt water. The  discomfort should disappear within 24 hours.  If you had a scopolamine patch placed behind your ear for the management of post- operative nausea and/or vomiting:  1. The medication in the patch is effective for 72 hours, after which it should be removed.  Wrap patch in a tissue and discard in the trash. Wash hands thoroughly with soap and water. 2. You may remove the patch earlier than 72 hours if you experience unpleasant side effects which may include dry mouth, dizziness or visual disturbances. 3. Avoid touching the patch. Wash your hands with soap and water after contact with the patch.

## 2015-11-12 NOTE — Anesthesia Postprocedure Evaluation (Signed)
Anesthesia Post Note  Patient: [name]  Procedure(s) Performed: Procedure(s) (LRB): RIGHT KNEE ARTHROSCOPY WITH DEBRIDEMENT (Right)  Patient location during evaluation: PACU Anesthesia Type: General Level of consciousness: awake and alert Pain management: pain level controlled Vital Signs Assessment: post-procedure vital signs reviewed and stable Respiratory status: spontaneous breathing, nonlabored ventilation, respiratory function stable and patient connected to nasal cannula oxygen Cardiovascular status: blood pressure returned to baseline and stable Postop Assessment: no signs of nausea or vomiting Anesthetic complications: no    Last Vitals:  Filed Vitals:   11/12/15 1115 11/12/15 1139  BP: 140/80 160/90  Pulse: 75 70  Temp:  36.8 C  Resp: 24 20    Last Pain:  Filed Vitals:   11/12/15 1141  PainSc: 0-No pain        RLE Motor Response: Purposeful movement (11/12/15 1139) RLE Sensation: Full sensation (11/12/15 1139)      Khloe Hunkele DAVID

## 2015-11-12 NOTE — Op Note (Signed)
   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  PREOPERATIVE DIAGNOSIS: Right knee pain, scar hypertrophy  POSTOPERATIVE DIAGNOSIS: Same.  PROCEDURE: Right knee arthroscopy with extensive debridement and synovectomy  SURGEON: N. Eduard Roux, M.D.  ASSIST: none.  ANESTHESIA:  general  IV FLUIDS AND URINE: See anesthesia.  ESTIMATED BLOOD LOSS: minimal mL.  IMPLANTS: none  DRAINS: none  COMPLICATIONS: None.  DESCRIPTION OF PROCEDURE: The patient was brought to the operating room and placed supine on the operating table.  The patient had been signed prior to the procedure and this was documented. The patient had the anesthesia placed by the anesthesiologist.  A time-out was performed to confirm that this was the correct patient, site, side and location. The patient did receive antibiotics prior to the incision and was re-dosed during the procedure as needed at indicated intervals.  A tourniquet placed.  The patient had the operative extremity prepped and draped in the standard surgical fashion.    The standard anterior lateral and anterior medial arthroscopy portals were established. There was abundant scar tissue within the patellofemoral joint and in the medial gutter. This was all excised using oscillating shaver. We also performed extensive debridement and synovectomy of scar tissue in the medial and lateral compartments and in the femoral notch. The implant itself appeared to be intact and without complication. Once this was done we closed the arthroscopy portals with 3-0 nylon sutures. Sterile dressings were applied. Patient tolerated the procedure well.  POSTOPERATIVE PLAN: Patient will be weight bear as tolerated and increase activity as tolerated.  Azucena Cecil, MD [place] 10:43 AM

## 2015-11-12 NOTE — Anesthesia Procedure Notes (Addendum)
Performed by: Marrianne Mood   Procedure Name: LMA Insertion Date/Time: 11/12/2015 9:58 AM Performed by: Marrianne Mood Pre-anesthesia Checklist: Patient identified, Emergency Drugs available, Suction available, Patient being monitored and Timeout performed Patient Re-evaluated:Patient Re-evaluated prior to inductionOxygen Delivery Method: Circle System Utilized Preoxygenation: Pre-oxygenation with 100% oxygen Intubation Type: IV induction Ventilation: Mask ventilation without difficulty LMA: LMA inserted LMA Size: 5.0 Number of attempts: 1 Airway Equipment and Method: Bite block Placement Confirmation: positive ETCO2 Tube secured with: Tape Dental Injury: Teeth and Oropharynx as per pre-operative assessment

## 2015-11-12 NOTE — H&P (Signed)
PREOPERATIVE H&P  Chief Complaint: right knee pain, scar tissue s/p total knee arthroplasty  HPI: Jonathan Forbes is a 65 y.o. male who presents for surgical treatment of right knee pain, scar tissue s/p total knee arthroplasty.  He denies any changes in medical history.  Past Medical History  Diagnosis Date  . Depression   . Glaucoma     Hx; of beginning stages  . Eczema     Hx; of  . Mitral valve regurgitation     Hx; of slight  . OA (osteoarthritis) of knee     "both" (05/16/2014)  . Skin cancer 08/2013    "back"  . Hypertension     off all BP meds after bariatric surgery  . OSA (obstructive sleep apnea)     no CPAP   Past Surgical History  Procedure Laterality Date  . Tumor excision      skin cancer of right side of back-wound vac  . Cataract extraction w/ intraocular lens  implant, bilateral Bilateral   . Colonoscopy      Hx; of  . Total knee arthroplasty Left 12/05/2013    Procedure: LEFT TOTAL KNEE ARTHROPLASTY;  Surgeon: Marianna Payment, MD;  Location: [facility];  Service: Orthopedics;  Laterality: Left;  . Breath tek h pylori N/A 05/14/2014    Procedure: BREATH TEK H PYLORI;  Surgeon: Pedro Earls, MD;  Location: [facility];  Service: General;  Laterality: N/A;  . Total knee arthroplasty Right 05/15/2014    Procedure: RIGHT TOTAL KNEE ARTHROPLASTY;  Surgeon: Marianna Payment, MD;  Location: [facility];  Service: Orthopedics;  Laterality: Right;  . Skin cancer excision  08/2013    "off my back; not melanoma"  . Tonsillectomy  as child  . Laparoscopic gastric sleeve resection with hiatal hernia repair  01/06/2015    Procedure: LAPAROSCOPIC GASTRIC SLEEVE RESECTION WITH HIATAL HERNIA REPAIR;  Surgeon: Johnathan Hausen, MD;  Location: [facility];  Service: General;;  . Upper gi endoscopy  01/06/2015    Procedure: UPPER GI ENDOSCOPY;  Surgeon: Johnathan Hausen, MD;  Location: [facility];  Service: General;;  . Joint replacement Bilateral   . Hernia repair    . Knee  arthroscopy Left 08/27/2015    Procedure: LEFT KNEE ARTHROSCOPY WITH SYNOVECTOMY;  Surgeon: Leandrew Koyanagi, MD;  Location: [facility];  Service: Orthopedics;  Laterality: Left;   Social History   Social History  . Marital Status: Divorced    Spouse Name: N/A  . Number of Children: N/A  . Years of Education: N/A   Social History Main Topics  . Smoking status: Never Smoker   . Smokeless tobacco: Never Used  . Alcohol Use: Yes     Comment: occasional burbon  . Drug Use: No  . Sexual Activity: Not Currently   Other Topics Concern  . None   Social History Narrative   Family History  Problem Relation Age of Onset  . Heart disease Father   . Hypertension Father    No Known Allergies Prior to Admission medications   Medication Sig Start Date End Date Taking? Authorizing Provider  calcium-vitamin D (OSCAL WITH D) 500-200 MG-UNIT tablet Take 1 tablet by mouth.   Yes Historical Provider, MD  cholecalciferol (VITAMIN D) 400 UNITS TABS tablet Take 400 Units by mouth.   Yes Historical Provider, MD  cyanocobalamin 500 MCG tablet Take 500 mcg by mouth daily.   Yes Historical Provider, MD  Melatonin 1 MG TABS Take by  mouth.   Yes Historical Provider, MD  Multiple Vitamin (MULTIVITAMIN WITH MINERALS) TABS tablet Take 1 tablet by mouth daily.   Yes Historical Provider, MD  traZODone (DESYREL) 50 MG tablet Take 50 mg by mouth at bedtime.   Yes Historical Provider, MD     Positive ROS: All other systems have been reviewed and were otherwise negative with the exception of those mentioned in the HPI and as above.  Physical Exam: General: Alert, no acute distress Cardiovascular: No pedal edema Respiratory: No cyanosis, no use of accessory musculature GI: abdomen soft Skin: No lesions in the area of chief complaint Neurologic: Sensation intact distally Psychiatric: Patient is competent for consent with normal mood and affect Lymphatic: no lymphedema  MUSCULOSKELETAL: exam  stable  Assessment: right knee pain, scar tissue s/p total knee arthroplasty  Plan: Plan for Procedure(s): RIGHT KNEE ARTHROSCOPY WITH DEBRIDEMENT  The risks benefits and alternatives were discussed with the patient including but not limited to the risks of nonoperative treatment, versus surgical intervention including infection, bleeding, nerve injury,  blood clots, cardiopulmonary complications, morbidity, mortality, among others, and they were willing to proceed.   Marianna Payment, MD   11/12/2015 8:01 AM

## 2015-11-12 NOTE — Anesthesia Preprocedure Evaluation (Addendum)
Anesthesia Evaluation  Patient identified by MRN, date of birth, ID band Patient awake    Reviewed: Allergy & Precautions, NPO status , Patient's Chart, lab work & pertinent test results  Airway Mallampati: I  TM Distance: >3 FB Neck ROM: Full    Dental no notable dental hx. (+) Teeth Intact, Dental Advisory Given, Partial Upper Upper partial, permanent:   Pulmonary neg pulmonary ROS, neg sleep apnea (improved after weight loss),    Pulmonary exam normal breath sounds clear to auscultation       Cardiovascular hypertension (no meds after weight loss with bariatric surgery), negative cardio ROS Normal cardiovascular exam+ Valvular Problems/Murmurs MR  Rhythm:Regular Rate:Normal     Neuro/Psych PSYCHIATRIC DISORDERS Depression  Neuromuscular disease    GI/Hepatic Neg liver ROS, hiatal hernia, S/p Gastric Sleeve Resection   Endo/Other  negative endocrine ROS  Renal/GU negative Renal ROS  negative genitourinary   Musculoskeletal negative musculoskeletal ROS (+) Arthritis ,   Abdominal   Peds negative pediatric ROS (+)  Hematology negative hematology ROS (+)   Anesthesia Other Findings Day of surgery medications reviewed with the patient.  Patient with rash on skin after taking unknown antibiotic for UTI.  Stopped medication 1 week ago.  Reproductive/Obstetrics negative OB ROS                           Anesthesia Physical Anesthesia Plan  ASA: II  Anesthesia Plan: General   Post-op Pain Management:    Induction: Intravenous  Airway Management Planned: LMA  Additional Equipment:   Intra-op Plan:   Post-operative Plan: Extubation in OR  Informed Consent: I have reviewed the patients History and Physical, chart, labs and discussed the procedure including the risks, benefits and alternatives for the proposed anesthesia with the patient or authorized representative who has indicated  his/her understanding and acceptance.     Plan Discussed with: CRNA, Anesthesiologist and Surgeon  Anesthesia Plan Comments:         Anesthesia Quick Evaluation

## 2015-11-19 ENCOUNTER — Encounter (HOSPITAL_BASED_OUTPATIENT_CLINIC_OR_DEPARTMENT_OTHER): Payer: Self-pay | Admitting: Orthopaedic Surgery

## 2015-11-28 ENCOUNTER — Other Ambulatory Visit: Payer: Self-pay | Admitting: Orthopaedic Surgery

## 2015-11-28 DIAGNOSIS — M79604 Pain in right leg: Secondary | ICD-10-CM

## 2015-12-02 ENCOUNTER — Other Ambulatory Visit: Payer: Medicare Other

## 2016-02-29 ENCOUNTER — Encounter (HOSPITAL_COMMUNITY): Payer: Self-pay | Admitting: Emergency Medicine

## 2016-02-29 ENCOUNTER — Emergency Department (HOSPITAL_COMMUNITY)
Admission: EM | Admit: 2016-02-29 | Discharge: 2016-02-29 | Disposition: A | Discharge status: Home / Self Care | Payer: Medicare Other | Attending: Emergency Medicine | Admitting: Emergency Medicine

## 2016-02-29 DIAGNOSIS — M170 Bilateral primary osteoarthritis of knee: Secondary | ICD-10-CM | POA: Insufficient documentation

## 2016-02-29 DIAGNOSIS — Z79899 Other long term (current) drug therapy: Secondary | ICD-10-CM | POA: Insufficient documentation

## 2016-02-29 DIAGNOSIS — Z8546 Personal history of malignant neoplasm of prostate: Secondary | ICD-10-CM | POA: Diagnosis not present

## 2016-02-29 DIAGNOSIS — I10 Essential (primary) hypertension: Secondary | ICD-10-CM | POA: Insufficient documentation

## 2016-02-29 DIAGNOSIS — Z8669 Personal history of other diseases of the nervous system and sense organs: Secondary | ICD-10-CM | POA: Diagnosis not present

## 2016-02-29 DIAGNOSIS — N342 Other urethritis: Secondary | ICD-10-CM | POA: Insufficient documentation

## 2016-02-29 DIAGNOSIS — Z85828 Personal history of other malignant neoplasm of skin: Secondary | ICD-10-CM | POA: Insufficient documentation

## 2016-02-29 DIAGNOSIS — R300 Dysuria: Secondary | ICD-10-CM | POA: Diagnosis present

## 2016-02-29 DIAGNOSIS — Z872 Personal history of diseases of the skin and subcutaneous tissue: Secondary | ICD-10-CM | POA: Diagnosis not present

## 2016-02-29 DIAGNOSIS — F419 Anxiety disorder, unspecified: Secondary | ICD-10-CM | POA: Diagnosis not present

## 2016-02-29 DIAGNOSIS — F329 Major depressive disorder, single episode, unspecified: Secondary | ICD-10-CM | POA: Insufficient documentation

## 2016-02-29 DIAGNOSIS — R61 Generalized hyperhidrosis: Secondary | ICD-10-CM | POA: Insufficient documentation

## 2016-02-29 DIAGNOSIS — Z87442 Personal history of urinary calculi: Secondary | ICD-10-CM | POA: Insufficient documentation

## 2016-02-29 DIAGNOSIS — N341 Nonspecific urethritis: Secondary | ICD-10-CM

## 2016-02-29 LAB — URINALYSIS, ROUTINE W REFLEX MICROSCOPIC
Bilirubin Urine: NEGATIVE
Glucose, UA: NEGATIVE mg/dL
Hgb urine dipstick: NEGATIVE
Ketones, ur: NEGATIVE mg/dL
Leukocytes, UA: NEGATIVE
Nitrite: NEGATIVE
Protein, ur: 30 mg/dL — AB
Specific Gravity, Urine: 1.024 (ref 1.005–1.030)
pH: 7.5 (ref 5.0–8.0)

## 2016-02-29 LAB — CBC WITH DIFFERENTIAL/PLATELET
BASOS ABS: 0 10*3/uL (ref 0.0–0.1)
Basophils Relative: 1 %
EOS ABS: 0.1 10*3/uL (ref 0.0–0.7)
Eosinophils Relative: 2 %
HCT: 40.1 % (ref 39.0–52.0)
HEMOGLOBIN: 13.5 g/dL (ref 13.0–17.0)
LYMPHS ABS: 1.9 10*3/uL (ref 0.7–4.0)
LYMPHS PCT: 29 %
MCH: 31.5 pg (ref 26.0–34.0)
MCHC: 33.7 g/dL (ref 30.0–36.0)
MCV: 93.5 fL (ref 78.0–100.0)
Monocytes Absolute: 0.6 10*3/uL (ref 0.1–1.0)
Monocytes Relative: 9 %
NEUTROS PCT: 60 %
Neutro Abs: 3.9 10*3/uL (ref 1.7–7.7)
PLATELETS: 219 10*3/uL (ref 150–400)
RBC: 4.29 MIL/uL (ref 4.22–5.81)
RDW: 14.2 % (ref 11.5–15.5)
WBC: 6.5 10*3/uL (ref 4.0–10.5)

## 2016-02-29 LAB — BASIC METABOLIC PANEL
Anion gap: 10 (ref 5–15)
BUN: 24 mg/dL — ABNORMAL HIGH (ref 6–20)
CHLORIDE: 112 mmol/L — AB (ref 101–111)
CO2: 26 mmol/L (ref 22–32)
CREATININE: 0.82 mg/dL (ref 0.61–1.24)
Calcium: 9.8 mg/dL (ref 8.9–10.3)
Glucose, Bld: 84 mg/dL (ref 65–99)
POTASSIUM: 3.7 mmol/L (ref 3.5–5.1)
SODIUM: 148 mmol/L — AB (ref 135–145)

## 2016-02-29 LAB — URINE MICROSCOPIC-ADD ON
Bacteria, UA: NONE SEEN
RBC / HPF: NONE SEEN RBC/hpf (ref 0–5)
WBC, UA: NONE SEEN WBC/hpf (ref 0–5)

## 2016-02-29 MED ORDER — SODIUM CHLORIDE 0.9 % IV BOLUS (SEPSIS)
1000.0000 mL | Freq: Once | INTRAVENOUS | Status: AC
  Administered 2016-02-29: 1000 mL via INTRAVENOUS

## 2016-02-29 MED ORDER — PHENAZOPYRIDINE HCL 200 MG PO TABS: 200.0000 mg | ORAL_TABLET | Freq: Three times a day (TID) | ORAL | Status: DC

## 2016-02-29 MED ORDER — PHENAZOPYRIDINE HCL 200 MG PO TABS
200.0000 mg | ORAL_TABLET | Freq: Three times a day (TID) | ORAL | Status: DC
  Administered 2016-02-29: 200 mg via ORAL
  Filled 2016-02-29: qty 1

## 2016-02-29 MED ORDER — METOCLOPRAMIDE HCL 5 MG/ML IJ SOLN
10.0000 mg | Freq: Once | INTRAMUSCULAR | Status: AC
  Administered 2016-02-29: 10 mg via INTRAVENOUS
  Filled 2016-02-29: qty 2

## 2016-02-29 MED ORDER — DIPHENHYDRAMINE HCL 50 MG/ML IJ SOLN
25.0000 mg | Freq: Once | INTRAMUSCULAR | Status: AC
  Administered 2016-02-29: 25 mg via INTRAVENOUS
  Filled 2016-02-29: qty 1

## 2016-02-29 MED ORDER — KETOROLAC TROMETHAMINE 15 MG/ML IJ SOLN
15.0000 mg | Freq: Once | INTRAMUSCULAR | Status: AC
  Administered 2016-02-29: 15 mg via INTRAVENOUS
  Filled 2016-02-29: qty 1

## 2016-02-29 NOTE — Discharge Instructions (Signed)
Follow-up with your primary care provider and your urologist tomorrow regarding your visit to the emergency department today.  Return to the emergency department if you experience bloody urine, worsening abdominal pain, nausea, vomiting, fever, chills.  Urethritis, Adult Urethritis is an inflammation of the tube through which urine exits your bladder (urethra).  CAUSES Urethritis is often caused by an infection in your urethra. The infection can be viral, like herpes. The infection can also be bacterial, like gonorrhea. RISK FACTORS Risk factors of urethritis include:  Having sex without using a condom.  Having multiple sexual partners.  Having poor hygiene. SIGNS AND SYMPTOMS Symptoms of urethritis are less noticeable in women than in men. These symptoms include:  Burning feeling when you urinate (dysuria).  Discharge from your urethra.  Blood in your urine (hematuria).  Urinating more than usual. DIAGNOSIS  To confirm a diagnosis of urethritis, your health care provider will do the following:  Ask about your sexual history.  Perform a physical exam.  Have you provide a sample of your urine for lab testing.  Use a cotton swab to gently collect a sample from your urethra for lab testing. TREATMENT  It is important to treat urethritis. Depending on the cause, untreated urethritis may lead to serious genital infections and possibly infertility. Urethritis caused by a bacterial infection is treated with antibiotic medicine. All sexual partners must be treated.  HOME CARE INSTRUCTIONS  Do not have sex until the test results are known and treatment is completed, even if your symptoms go away before you finish treatment.  If you were prescribed an antibiotic, finish it all even if you start to feel better. SEEK MEDICAL CARE IF:   Your symptoms are not improved in 3 days.  Your symptoms are getting worse.  You develop abdominal pain or pelvic pain (in women).  You develop  joint pain.  You have a fever. SEEK IMMEDIATE MEDICAL CARE IF:   You have severe pain in the belly, back, or side.  You have repeated vomiting. MAKE SURE YOU:  Understand these instructions.  Will watch your condition.  Will get help right away if you are not doing well or get worse.   This information is not intended to replace advice given to you by your health care provider. Make sure you discuss any questions you have with your health care provider.   Document Released: 04/06/2001 Document Revised: 02/25/2015 Document Reviewed: 06/11/2013 Elsevier Interactive Patient Education [employer].

## 2016-02-29 NOTE — ED Notes (Signed)
Hx of prostate ca, radioactive seeds in prostate, kidney stones. Had a lithotripsy done two weeks ago to crush stones, since then has continued to have pain with voiding. "Feels like glass when I pee." Has not seen blood in urine, says it hurts so much to void that he tries to delay it. Denies abdominal and flank pain, does have nausea but no vomiting.

## 2016-02-29 NOTE — ED Provider Notes (Signed)
CSN: [id number]     Arrival date & time 02/29/16  1748 History   First MD Initiated Contact with Patient 02/29/16 1818     Chief Complaint  Patient presents with  . Dysuria  . Nephrolithiasis     (Consider location/radiation/quality/duration/timing/severity/associated sxs/prior Treatment) HPI   Patient is a 65 year old male with a past medical history of anxiety, hypertension, prostate cancer, kidney stones who presents to the ED with worsening pain during urination. Patient states late Wednesday night he felt flank pain and supra pubic pain and believes he may have passed a kidney stone that night. He states the pain went away until Friday at around 5 PM when he started having dysuria, urgency, difficulty urinating. He states the pain is burning in nature, 9/10, constant during the stream, non-radiating, he has not taken anything for the pain, nothing makes it better. Patient states associated chills, night sweats, mild suprapubic pressure but denies fever. He states one month ago he had stones removed and a stent placed. The stent was removed 3 weeks ago. Patient also had radioactive pills place in his prostate roughly 2 weeks ago. He states he is not sexually active. He denies hematuria, nausea, vomiting, fever, chills. penile discharge, testicular pain or swelling. Pt endorses a mild bilateral temporal headache. He states he thinks the headache is cause he is stressed out from the pain.   Past Medical History  Diagnosis Date  . Depression   . Glaucoma     Hx; of beginning stages  . Eczema     Hx; of  . Mitral valve regurgitation     Hx; of slight  . OA (osteoarthritis) of knee     "both" (05/16/2014)  . Skin cancer 08/2013    "back"  . Hypertension     off all BP meds after bariatric surgery  . OSA (obstructive sleep apnea)     no CPAP   Past Surgical History  Procedure Laterality Date  . Tumor excision      skin cancer of right side of back-wound vac  . Cataract extraction w/  intraocular lens  implant, bilateral Bilateral   . Colonoscopy      Hx; of  . Total knee arthroplasty Left 12/05/2013    Procedure: LEFT TOTAL KNEE ARTHROPLASTY;  Surgeon: Marianna Payment, MD;  Location: [place];  Service: Orthopedics;  Laterality: Left;  . Breath tek h pylori N/A 05/14/2014    Procedure: BREATH TEK H PYLORI;  Surgeon: Pedro Earls, MD;  Location: [facility];  Service: General;  Laterality: N/A;  . Total knee arthroplasty Right 05/15/2014    Procedure: RIGHT TOTAL KNEE ARTHROPLASTY;  Surgeon: Marianna Payment, MD;  Location: [place];  Service: Orthopedics;  Laterality: Right;  . Skin cancer excision  08/2013    "off my back; not melanoma"  . Tonsillectomy  as child  . Laparoscopic gastric sleeve resection with hiatal hernia repair  01/06/2015    Procedure: LAPAROSCOPIC GASTRIC SLEEVE RESECTION WITH HIATAL HERNIA REPAIR;  Surgeon: Johnathan Hausen, MD;  Location: [facility];  Service: General;;  . Upper gi endoscopy  01/06/2015    Procedure: UPPER GI ENDOSCOPY;  Surgeon: Johnathan Hausen, MD;  Location: [facility];  Service: General;;  . Joint replacement Bilateral   . Hernia repair    . Knee arthroscopy Left 08/27/2015    Procedure: LEFT KNEE ARTHROSCOPY WITH SYNOVECTOMY;  Surgeon: Leandrew Koyanagi, MD;  Location: [place];  Service: Orthopedics;  Laterality: Left;  . Knee  arthroscopy Right 11/12/2015    Procedure: RIGHT KNEE ARTHROSCOPY WITH DEBRIDEMENT;  Surgeon: Leandrew Koyanagi, MD;  Location: [place];  Service: Orthopedics;  Laterality: Right;  . Lithotripsy     Family History  Problem Relation Age of Onset  . Heart disease Father   . Hypertension Father    Social History  Substance Use Topics  . Smoking status: Never Smoker   . Smokeless tobacco: Never Used  . Alcohol Use: Yes     Comment: occasional burbon    Review of Systems  Constitutional: Positive for chills and diaphoresis. Negative for fever.  HENT: Negative for trouble swallowing.    Eyes: Negative for visual disturbance.  Respiratory: Negative for cough, chest tightness and shortness of breath.   Cardiovascular: Negative for chest pain.  Gastrointestinal: Negative for nausea, vomiting, abdominal pain, blood in stool and abdominal distention.  Endocrine: Negative for polyphagia.  Genitourinary: Positive for dysuria, urgency, frequency, decreased urine volume and difficulty urinating. Negative for hematuria, flank pain, discharge, penile swelling, penile pain and testicular pain.  Musculoskeletal: Negative for back pain and neck pain.  Skin: Negative for rash.  Neurological: Negative for dizziness, syncope and weakness.  Psychiatric/Behavioral: Negative for confusion and agitation. The patient is nervous/anxious.       Allergies  Ciprofloxacin hcl  Home Medications   Prior to Admission medications   Medication Sig Start Date End Date Taking? Authorizing Provider  amLODipine (NORVASC) 10 MG tablet Take 10 mg by mouth daily.   Yes Historical Provider, MD  amphetamine-dextroamphetamine (ADDERALL XR) 20 MG 24 hr capsule Take 20 mg by mouth 3 (three) times daily.   Yes Historical Provider, MD  calcium-vitamin D (OSCAL WITH D) 500-200 MG-UNIT tablet Take 1 tablet by mouth.   Yes Historical Provider, MD  cholecalciferol (VITAMIN D) 400 UNITS TABS tablet Take 400 Units by mouth.   Yes Historical Provider, MD  clonazePAM (KLONOPIN) 2 MG tablet Take 2 mg by mouth.   Yes Historical Provider, MD  cloNIDine (CATAPRES) 0.1 MG tablet Take 0.1 mg by mouth daily.   Yes Historical Provider, MD  cyanocobalamin 500 MCG tablet Take 500 mcg by mouth daily.   Yes Historical Provider, MD  HYDROcodone-acetaminophen (NORCO) 7.5-325 MG tablet Take 1-2 tablets by mouth every 6 (six) hours as needed for moderate pain. 11/12/15  Yes Naiping Ephriam Jenkins, MD  Multiple Vitamin (MULTIVITAMIN WITH MINERALS) TABS tablet Take 1 tablet by mouth daily.   Yes Historical Provider, MD  Multiple Vitamin  (MULTIVITAMIN) capsule Take 1 capsule by mouth daily.   Yes Historical Provider, MD  traZODone (DESYREL) 100 MG tablet Take 100 mg by mouth daily.   Yes Historical Provider, MD  Vilazodone HCl (VIIBRYD) 40 MG TABS Take 1 tablet by mouth daily.   Yes Historical Provider, MD  phenazopyridine (PYRIDIUM) 200 MG tablet Take 1 tablet (200 mg total) by mouth 3 (three) times daily with meals. 03/01/16   Earnestine Shipp L Lesleigh Hughson, PA   BP 161/87 mmHg  Pulse 65  Temp(Src) 98.4 F (36.9 C) (Oral)  Resp 20  SpO2 99% Physical Exam  Constitutional: He appears well-developed and well-nourished. No distress.  HENT:  Head: Normocephalic and atraumatic.  Eyes: Conjunctivae are normal.  Neck: Normal range of motion.  Cardiovascular: Normal rate and normal heart sounds.   Pulmonary/Chest: Effort normal and breath sounds normal.  Abdominal: Soft. Bowel sounds are normal. He exhibits no distension. There is no rebound and no guarding.  Mild TTP to the suprapubic region  Genitourinary: Testes normal and penis normal. Right testis shows no swelling and no tenderness. Left testis shows no swelling and no tenderness. Circumcised. No penile erythema or penile tenderness. No discharge found.  Musculoskeletal: Normal range of motion. He exhibits no edema.  Neurological: He is alert. Coordination normal.  Skin: Skin is warm and dry. No rash noted. He is not diaphoretic.  Psychiatric: He has a normal mood and affect.  Nursing note and vitals reviewed.   ED Course  Procedures (including critical care time)  2100: States his headache is improving. He stated he wanted me to insert numbing medication into his urethra and I stated that I was not going to because we cannot do that and I did not want to cause any damage. I informed him he needs to follow up with Urology tomorrow. He stated during his previous urology procedures they placed a gel on the tip of his penis that numbed everything. I stated it was likely a gel and it was  applied also to the equipment they used for the procedures.    Labs Review Labs Reviewed  URINALYSIS, ROUTINE W REFLEX MICROSCOPIC (NOT AT [facility]) - Abnormal; Notable for the following:    APPearance TURBID (*)    Protein, ur 30 (*)    All other components within normal limits  URINE MICROSCOPIC-ADD ON - Abnormal; Notable for the following:    Squamous Epithelial / LPF 0-5 (*)    All other components within normal limits  BASIC METABOLIC PANEL - Abnormal; Notable for the following:    Sodium 148 (*)    Chloride 112 (*)    BUN 24 (*)    All other components within normal limits  GONOCOCCUS CULTURE  CHLAMYDIA CULTURE  CBC WITH DIFFERENTIAL/PLATELET    Imaging Review No results found. I have personally reviewed and evaluated these images and lab results as part of my medical decision-making.   EKG Interpretation None      MDM   Final diagnoses:  Nonspecific urethritis    Pt with urethritis and a headache. UA did not reveal signs of an infection. This is likely urethritis based on presentation and hx of passing a kidney stone a few days prior. Pt denies being sexually active so less concerning for STD related urethritis. Have ordered gonorrhea and chlamydia cultures anyways. BMP showed high BUN, NA & CLH and slightly dehydrated he stated he hadn't been drinking much water. Gave pt fluids and migraine cocktail. Patient's headache improved while in the ED. He states pain with urination improved as well.  Discharged pt with a prescription for Pyridium and he states he will f/u with his urologist tomorrow. Discussed strict return precautions. Pt expressed understanding to the discharge instructions.    Kalman Drape, PA 03/01/16 0009  Virgel Manifold, MD 03/04/16 1255

## 2016-05-23 ENCOUNTER — Emergency Department (HOSPITAL_COMMUNITY)
Admission: EM | Admit: 2016-05-23 | Discharge: 2016-05-24 | Disposition: A | Discharge status: Other Acute Inpatient Hospital | Payer: Medicare Other | Attending: Emergency Medicine | Admitting: Emergency Medicine

## 2016-05-23 ENCOUNTER — Encounter (HOSPITAL_COMMUNITY): Payer: Self-pay | Admitting: Emergency Medicine

## 2016-05-23 DIAGNOSIS — Z79899 Other long term (current) drug therapy: Secondary | ICD-10-CM | POA: Diagnosis not present

## 2016-05-23 DIAGNOSIS — F329 Major depressive disorder, single episode, unspecified: Secondary | ICD-10-CM | POA: Insufficient documentation

## 2016-05-23 DIAGNOSIS — R45851 Suicidal ideations: Secondary | ICD-10-CM

## 2016-05-23 DIAGNOSIS — I10 Essential (primary) hypertension: Secondary | ICD-10-CM | POA: Insufficient documentation

## 2016-05-23 DIAGNOSIS — Z85828 Personal history of other malignant neoplasm of skin: Secondary | ICD-10-CM | POA: Insufficient documentation

## 2016-05-23 DIAGNOSIS — Z96653 Presence of artificial knee joint, bilateral: Secondary | ICD-10-CM | POA: Diagnosis not present

## 2016-05-23 LAB — CBC
HCT: 42.5 % (ref 39.0–52.0)
HEMOGLOBIN: 14.1 g/dL (ref 13.0–17.0)
MCH: 31.5 pg (ref 26.0–34.0)
MCHC: 33.2 g/dL (ref 30.0–36.0)
MCV: 94.9 fL (ref 78.0–100.0)
PLATELETS: 145 10*3/uL — AB (ref 150–400)
RBC: 4.48 MIL/uL (ref 4.22–5.81)
RDW: 13.1 % (ref 11.5–15.5)
WBC: 5.5 10*3/uL (ref 4.0–10.5)

## 2016-05-23 LAB — RAPID URINE DRUG SCREEN, HOSP PERFORMED
AMPHETAMINES: NOT DETECTED
BENZODIAZEPINES: POSITIVE — AB
Barbiturates: NOT DETECTED
COCAINE: NOT DETECTED
OPIATES: POSITIVE — AB
TETRAHYDROCANNABINOL: POSITIVE — AB

## 2016-05-23 LAB — COMPREHENSIVE METABOLIC PANEL
ALT: 23 U/L (ref 17–63)
AST: 18 U/L (ref 15–41)
Albumin: 4.1 g/dL (ref 3.5–5.0)
Alkaline Phosphatase: 56 U/L (ref 38–126)
Anion gap: 5 (ref 5–15)
BUN: 12 mg/dL (ref 6–20)
CHLORIDE: 110 mmol/L (ref 101–111)
CO2: 26 mmol/L (ref 22–32)
CREATININE: 0.78 mg/dL (ref 0.61–1.24)
Calcium: 9.3 mg/dL (ref 8.9–10.3)
Glucose, Bld: 117 mg/dL — ABNORMAL HIGH (ref 65–99)
POTASSIUM: 3.2 mmol/L — AB (ref 3.5–5.1)
SODIUM: 141 mmol/L (ref 135–145)
Total Bilirubin: 0.9 mg/dL (ref 0.3–1.2)
Total Protein: 6.1 g/dL — ABNORMAL LOW (ref 6.5–8.1)

## 2016-05-23 LAB — ACETAMINOPHEN LEVEL

## 2016-05-23 LAB — ETHANOL

## 2016-05-23 LAB — SALICYLATE LEVEL

## 2016-05-23 MED ORDER — ACETAMINOPHEN 325 MG PO TABS: 650.0000 mg | ORAL_TABLET | ORAL | Status: DC | PRN

## 2016-05-23 NOTE — ED Notes (Signed)
Staffing office and charge nurse notified for pt.'s sitter , purple scrubs given to pt. , security notified to wand pt.  

## 2016-05-23 NOTE — ED Provider Notes (Signed)
[facility] [facility] DEPT Provider Note   CSN: [id number] Arrival date & time: 05/23/16  2120  First Provider Contact:  None       History   Chief Complaint Chief Complaint  Patient presents with  . Suicidal    HPI Jonathan Forbes is a 65 y.o. male.  Patient with a history of depression, HTN presents with complaint of depression, he feels, caused by worsening memory issues. He reports memory loss is progressive, not sudden. Symptoms of despair and depression heightened today when he remembers putting his wallet down but cannot remember where. He reports he feels suicidal with plan to overdose on medications. He denies self harm prior to arrival.   The history is provided by the patient. No language interpreter was used.    Past Medical History:  Diagnosis Date  . Depression   . Eczema    Hx; of  . Glaucoma    Hx; of beginning stages  . Hypertension    off all BP meds after bariatric surgery  . Mitral valve regurgitation    Hx; of slight  . OA (osteoarthritis) of knee    "both" (05/16/2014)  . OSA (obstructive sleep apnea)    no CPAP  . Skin cancer 08/2013   "back"    Patient Active Problem List   Diagnosis Date Noted  . 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 Osteoarthritis of right knee 05/15/2014  . OSA (obstructive sleep apnea) 04/22/2014  . HTN (hypertension) 12/14/2013  . Meralgia paraesthetica 12/14/2013  . Postoperative anemia due to acute blood loss 12/14/2013  . OA (osteoarthritis) of knee 12/11/2013  . Osteoarthritis of left knee 12/05/2013  . Total knee replacement status 12/05/2013    Past Surgical History:  Procedure Laterality Date  . BREATH TEK H PYLORI N/A 05/14/2014   Procedure: BREATH TEK H PYLORI;  Surgeon: Pedro Earls, MD;  Location: [facility];  Service: General;  Laterality: N/A;  . CATARACT EXTRACTION W/ INTRAOCULAR LENS  IMPLANT, BILATERAL Bilateral   . COLONOSCOPY     Hx; of  . HERNIA  REPAIR    . JOINT REPLACEMENT Bilateral   . KNEE ARTHROSCOPY Left 08/27/2015   Procedure: LEFT KNEE ARTHROSCOPY WITH SYNOVECTOMY;  Surgeon: Leandrew Koyanagi, MD;  Location: [place];  Service: Orthopedics;  Laterality: Left;  . KNEE ARTHROSCOPY Right 11/12/2015   Procedure: RIGHT KNEE ARTHROSCOPY WITH DEBRIDEMENT;  Surgeon: Leandrew Koyanagi, MD;  Location: [place];  Service: Orthopedics;  Laterality: Right;  . LAPAROSCOPIC GASTRIC SLEEVE RESECTION WITH HIATAL HERNIA REPAIR  01/06/2015   Procedure: LAPAROSCOPIC GASTRIC SLEEVE RESECTION WITH HIATAL HERNIA REPAIR;  Surgeon: Johnathan Hausen, MD;  Location: [facility];  Service: General;;  . LITHOTRIPSY    . SKIN CANCER EXCISION  08/2013   "off my back; not melanoma"  . TONSILLECTOMY  as child  . TOTAL KNEE ARTHROPLASTY Left 12/05/2013   Procedure: LEFT TOTAL KNEE ARTHROPLASTY;  Surgeon: Marianna Payment, MD;  Location: [place];  Service: Orthopedics;  Laterality: Left;  . TOTAL KNEE ARTHROPLASTY Right 05/15/2014   Procedure: RIGHT TOTAL KNEE ARTHROPLASTY;  Surgeon: Marianna Payment, MD;  Location: [place];  Service: Orthopedics;  Laterality: Right;  . TUMOR EXCISION     skin cancer of right side of back-wound vac  . UPPER GI ENDOSCOPY  01/06/2015   Procedure: UPPER GI ENDOSCOPY;  Surgeon: Johnathan Hausen, MD;  Location: [facility];  Service: General;;  Home Medications    Prior to Admission medications   Medication Sig Start Date End Date Taking? Authorizing Provider  amLODipine (NORVASC) 10 MG tablet Take 10 mg by mouth daily.   Yes Historical Provider, MD  amphetamine-dextroamphetamine (ADDERALL XR) 20 MG 24 hr capsule Take 20 mg by mouth 3 (three) times daily.   Yes Historical Provider, MD  calcium-vitamin D (OSCAL WITH D) 500-200 MG-UNIT tablet Take 1 tablet by mouth daily with breakfast.    Yes Historical Provider, MD  cholecalciferol (VITAMIN D) 400 UNITS TABS tablet Take 400 Units by mouth.   Yes Historical  Provider, MD  clonazePAM (KLONOPIN) 2 MG tablet Take 2 mg by mouth at bedtime.    Yes Historical Provider, MD  cloNIDine (CATAPRES) 0.1 MG tablet Take 0.1 mg by mouth daily.   Yes Historical Provider, MD  cyanocobalamin 500 MCG tablet Take 500 mcg by mouth daily.   Yes Historical Provider, MD  HYDROcodone-acetaminophen (NORCO) 10-325 MG tablet Take 1 tablet by mouth 3 (three) times daily. 05/11/16  Yes Historical Provider, MD  Multiple Vitamin (MULTIVITAMIN WITH MINERALS) TABS tablet Take 1 tablet by mouth daily.   Yes Historical Provider, MD  omeprazole (PRILOSEC) 40 MG capsule Take 40 mg by mouth daily. 04/13/16  Yes Historical Provider, MD  phenazopyridine (PYRIDIUM) 200 MG tablet Take 1 tablet (200 mg total) by mouth 3 (three) times daily with meals. 03/01/16  Yes Kalman Drape, PA  tamsulosin (FLOMAX) 0.4 MG CAPS capsule Take 0.4 mg by mouth daily. 04/05/16  Yes Historical Provider, MD  traZODone (DESYREL) 100 MG tablet Take 100-200 mg by mouth at bedtime.    Yes Historical Provider, MD  Vilazodone HCl (VIIBRYD) 40 MG TABS Take 40 mg by mouth daily.    Yes Historical Provider, MD    Family History Family History  Problem Relation Age of Onset  . Heart disease Father   . Hypertension Father     Social History Social History  Substance Use Topics  . Smoking status: Never Smoker  . Smokeless tobacco: Never Used  . Alcohol use Yes     Comment: occasional burbon     Allergies   Ciprofloxacin hcl   Review of Systems Review of Systems  Constitutional: Negative for chills and fever.  HENT: Negative.   Respiratory: Negative.   Cardiovascular: Negative.   Gastrointestinal: Negative.   Musculoskeletal: Negative.   Skin: Negative.   Neurological: Negative.   Psychiatric/Behavioral: Positive for dysphoric mood and suicidal ideas.     Physical Exam Updated Vital Signs BP 160/90 (BP Location: Left Arm)   Pulse 60   Temp 98 F (36.7 C) (Oral)   Resp 16   Ht 6' (1.829 m)   Wt  86.2 kg   SpO2 94%   BMI 25.77 kg/m   Physical Exam  Constitutional: He appears well-developed and well-nourished.  HENT:  Head: Normocephalic.  Neck: Normal range of motion. Neck supple.  Cardiovascular: Normal rate and regular rhythm.   Pulmonary/Chest: Effort normal and breath sounds normal.  Abdominal: Soft. Bowel sounds are normal. There is no tenderness. There is no rebound and no guarding.  Musculoskeletal: Normal range of motion.  Neurological: He is alert. No cranial nerve deficit.  Skin: Skin is warm and dry. No rash noted.  Psychiatric: His speech is normal. He is slowed. He exhibits a depressed mood. He expresses suicidal ideation. He expresses suicidal plans.     ED Treatments / Results  Labs (all labs ordered are listed, but only  abnormal results are displayed) Labs Reviewed  COMPREHENSIVE METABOLIC PANEL - Abnormal; Notable for the following:       Result Value   Potassium 3.2 (*)    Glucose, Bld 117 (*)    Total Protein 6.1 (*)    All other components within normal limits  ACETAMINOPHEN LEVEL - Abnormal; Notable for the following:    Acetaminophen (Tylenol), Serum <10 (*)    All other components within normal limits  CBC - Abnormal; Notable for the following:    Platelets 145 (*)    All other components within normal limits  URINE RAPID DRUG SCREEN, HOSP PERFORMED - Abnormal; Notable for the following:    Opiates POSITIVE (*)    Benzodiazepines POSITIVE (*)    Tetrahydrocannabinol POSITIVE (*)    All other components within normal limits  ETHANOL  SALICYLATE LEVEL    EKG  EKG Interpretation  Date/Time:  Sunday May 23 2016 23:17:32 EDT Ventricular Rate:  47 PR Interval:  184 QRS Duration: 92 QT Interval:  448 QTC Calculation: 396 R Axis:   30 Text Interpretation:  Sinus bradycardia with sinus arrhythmia Septal infarct , age undetermined Abnormal ECG No STEMI.  Confirmed by LONG MD, JOSHUA [phone]) on 05/23/2016 11:27:27 PM       Radiology No  results found.  Procedures Procedures (including critical care time)  Medications Ordered in ED Medications  acetaminophen (TYLENOL) tablet 650 mg (not administered)     Initial Impression / Assessment and Plan / ED Course  I have reviewed the triage vital signs and the nursing notes.  Pertinent labs & imaging results that were available during my care of the patient were reviewed by me and considered in my medical decision making (see chart for details).  Clinical Course    Patient evaluated by TTS and found to meet inpatient criteria. He is voluntary currently and admits to SI. IVC recommended if patient tries to sign out of the hospital. TTS is working on placement.   Final Clinical Impressions(s) / ED Diagnoses   Final diagnoses:  None   1. Depression 2. Suicidal ideation  New Prescriptions New Prescriptions   No medications on file     Charlann Lange, PA-C 05/24/16 0636    Margette Fast, MD 05/24/16 1004

## 2016-05-23 NOTE — BH Assessment (Signed)
Contacted cart for tele-assessment and staff said not to start because Pt needed a procedure. ED staff will contact TTS when Pt is ready for tele-assessment.   Orpah Greek Anson Fret, LPC, [facility], [facility] Triage Specialist [phone]

## 2016-05-23 NOTE — ED Triage Notes (Signed)
Pt. reports depression / suicidal ideation plans to overdose on medications , denies hallucinations .

## 2016-05-24 ENCOUNTER — Inpatient Hospital Stay (HOSPITAL_COMMUNITY)
Admission: AD | Admit: 2016-05-24 | Discharge: 2016-05-27 | DRG: 885 | Disposition: A | Discharge status: Home / Self Care | Payer: Medicare Other | Source: Intra-hospital | Attending: Psychiatry | Admitting: Psychiatry

## 2016-05-24 ENCOUNTER — Encounter (HOSPITAL_COMMUNITY): Payer: Self-pay | Admitting: *Deleted

## 2016-05-24 DIAGNOSIS — C61 Malignant neoplasm of prostate: Secondary | ICD-10-CM | POA: Diagnosis present

## 2016-05-24 DIAGNOSIS — F329 Major depressive disorder, single episode, unspecified: Secondary | ICD-10-CM | POA: Diagnosis present

## 2016-05-24 DIAGNOSIS — G4700 Insomnia, unspecified: Secondary | ICD-10-CM | POA: Diagnosis present

## 2016-05-24 DIAGNOSIS — F339 Major depressive disorder, recurrent, unspecified: Secondary | ICD-10-CM | POA: Diagnosis present

## 2016-05-24 DIAGNOSIS — R45851 Suicidal ideations: Secondary | ICD-10-CM | POA: Diagnosis present

## 2016-05-24 DIAGNOSIS — F332 Major depressive disorder, recurrent severe without psychotic features: Secondary | ICD-10-CM

## 2016-05-24 HISTORY — DX: Malignant neoplasm of prostate: C61

## 2016-05-24 MED ORDER — CYANOCOBALAMIN 500 MCG PO TABS
500.0000 ug | ORAL_TABLET | Freq: Every day | ORAL | Status: DC
Start: 2016-05-25 — End: 2016-05-27
  Administered 2016-05-25 – 2016-05-27 (×3): 500 ug via ORAL
  Filled 2016-05-24 (×5): qty 1

## 2016-05-24 MED ORDER — PANTOPRAZOLE SODIUM 40 MG PO TBEC
40.0000 mg | DELAYED_RELEASE_TABLET | Freq: Every day | ORAL | Status: DC
  Administered 2016-05-25 – 2016-05-27 (×3): 40 mg via ORAL
  Filled 2016-05-24 (×5): qty 1

## 2016-05-24 MED ORDER — VILAZODONE HCL 40 MG PO TABS
40.0000 mg | ORAL_TABLET | Freq: Every day | ORAL | Status: DC
  Administered 2016-05-25 – 2016-05-27 (×3): 40 mg via ORAL
  Filled 2016-05-24 (×5): qty 1

## 2016-05-24 MED ORDER — TRAZODONE HCL 100 MG PO TABS
100.0000 mg | ORAL_TABLET | Freq: Every evening | ORAL | Status: DC | PRN
  Administered 2016-05-25: 100 mg via ORAL
  Filled 2016-05-24 (×9): qty 1

## 2016-05-24 MED ORDER — AMPHETAMINE-DEXTROAMPHET ER 5 MG PO CP24
20.0000 mg | ORAL_CAPSULE | Freq: Three times a day (TID) | ORAL | Status: DC
  Administered 2016-05-24: 20 mg via ORAL
  Filled 2016-05-24: qty 4

## 2016-05-24 MED ORDER — PANTOPRAZOLE SODIUM 40 MG PO TBEC
40.0000 mg | DELAYED_RELEASE_TABLET | Freq: Every day | ORAL | Status: DC
  Administered 2016-05-24: 40 mg via ORAL
  Filled 2016-05-24: qty 1

## 2016-05-24 MED ORDER — HYDROCODONE-ACETAMINOPHEN 10-325 MG PO TABS
1.0000 | ORAL_TABLET | Freq: Three times a day (TID) | ORAL | Status: DC
  Administered 2016-05-25 – 2016-05-26 (×4): 1 via ORAL
  Filled 2016-05-24 (×4): qty 1

## 2016-05-24 MED ORDER — CLONAZEPAM 0.5 MG PO TABS
2.0000 mg | ORAL_TABLET | Freq: Every day | ORAL | Status: DC
  Administered 2016-05-24: 2 mg via ORAL
  Filled 2016-05-24: qty 4

## 2016-05-24 MED ORDER — HYDROCODONE-ACETAMINOPHEN 5-325 MG PO TABS
2.0000 | ORAL_TABLET | Freq: Once | ORAL | Status: AC
  Administered 2016-05-24: 2 via ORAL
  Filled 2016-05-24: qty 2

## 2016-05-24 MED ORDER — MAGNESIUM HYDROXIDE 400 MG/5ML PO SUSP: 30.0000 mL | Freq: Every day | ORAL | Status: DC | PRN

## 2016-05-24 MED ORDER — CLONIDINE HCL 0.1 MG PO TABS
0.1000 mg | ORAL_TABLET | Freq: Every day | ORAL | Status: DC
  Administered 2016-05-25 – 2016-05-27 (×3): 0.1 mg via ORAL
  Filled 2016-05-24 (×5): qty 1

## 2016-05-24 MED ORDER — TAMSULOSIN HCL 0.4 MG PO CAPS
0.4000 mg | ORAL_CAPSULE | Freq: Every day | ORAL | Status: DC
  Administered 2016-05-24: 0.4 mg via ORAL
  Filled 2016-05-24: qty 1

## 2016-05-24 MED ORDER — ALUM & MAG HYDROXIDE-SIMETH 200-200-20 MG/5ML PO SUSP: 30.0000 mL | ORAL | Status: DC | PRN

## 2016-05-24 MED ORDER — ACETAMINOPHEN 325 MG PO TABS
650.0000 mg | ORAL_TABLET | Freq: Four times a day (QID) | ORAL | Status: DC | PRN
Start: 2016-05-24 — End: 2016-05-27

## 2016-05-24 MED ORDER — ADULT MULTIVITAMIN W/MINERALS CH
1.0000 | ORAL_TABLET | Freq: Every day | ORAL | Status: DC
  Administered 2016-05-25 – 2016-05-27 (×3): 1 via ORAL
  Filled 2016-05-24 (×5): qty 1

## 2016-05-24 MED ORDER — TRAZODONE HCL 100 MG PO TABS
100.0000 mg | ORAL_TABLET | Freq: Every day | ORAL | Status: DC
  Administered 2016-05-24: 200 mg via ORAL
  Filled 2016-05-24: qty 2

## 2016-05-24 MED ORDER — AMLODIPINE BESYLATE 10 MG PO TABS
10.0000 mg | ORAL_TABLET | Freq: Every day | ORAL | Status: DC
  Administered 2016-05-25 – 2016-05-27 (×3): 10 mg via ORAL
  Filled 2016-05-24 (×5): qty 1

## 2016-05-24 MED ORDER — AMLODIPINE BESYLATE 5 MG PO TABS
10.0000 mg | ORAL_TABLET | Freq: Every day | ORAL | Status: DC
  Administered 2016-05-24: 10 mg via ORAL
  Filled 2016-05-24: qty 2

## 2016-05-24 MED ORDER — CLONIDINE HCL 0.1 MG PO TABS
0.1000 mg | ORAL_TABLET | Freq: Every day | ORAL | Status: DC
  Administered 2016-05-24: 0.1 mg via ORAL
  Filled 2016-05-24: qty 1

## 2016-05-24 MED ORDER — CLONAZEPAM 1 MG PO TABS
2.0000 mg | ORAL_TABLET | Freq: Every day | ORAL | Status: DC
  Administered 2016-05-25: 2 mg via ORAL
  Filled 2016-05-24 (×2): qty 2

## 2016-05-24 MED ORDER — TAMSULOSIN HCL 0.4 MG PO CAPS
0.4000 mg | ORAL_CAPSULE | Freq: Every day | ORAL | Status: DC
  Administered 2016-05-25 – 2016-05-27 (×3): 0.4 mg via ORAL
  Filled 2016-05-24 (×5): qty 1

## 2016-05-24 NOTE — ED Notes (Signed)
Patient states he wants to leave.  States "you're going to need two cops to keep me here."  MD aware, proceeding with IVC.

## 2016-05-24 NOTE — Progress Notes (Signed)
Patient ID: Jonathan Forbes, male   DOB: February 05, 1951, 65 y.o.   MRN: [id number] Per State regulations 482.30 this chart was reviewed for medical necessity with respect to the patient's admission/duration of stay.    Next review date:05/28/16  Debarah Crape, BSN, RN-BC  Case Manager
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
## 2016-05-24 NOTE — ED Notes (Signed)
TTS at bedside. 

## 2016-05-25 NOTE — BHH Suicide Risk Assessment (Signed)
[facility] Admission Suicide Risk Assessment   Nursing information obtained from:   chart review, treatment team meeting Demographic factors:   divorced, caucasian Current Mental Status:   depressed Loss Factors:   discordance with his male friend Historical Factors:   denies history of suicide Risk Reduction Factors:   positive future plans, therapeutic relationship, seeking for help  Total Time spent with patient: 20 minutes Principal Problem: MDD (major depressive disorder) ([place]) Diagnosis:   Patient Active Problem List   Diagnosis Date Noted  . MDD (major depressive disorder) ([place]) [F32.9] 05/24/2016  . 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 Osteoarthritis of right knee [M17.9] 05/15/2014  . OSA (obstructive sleep apnea) [G47.33] 04/22/2014  . HTN (hypertension) [I10] 12/14/2013  . Meralgia paraesthetica [G57.10] 12/14/2013  . Postoperative anemia due to acute blood loss [D62] 12/14/2013  . OA (osteoarthritis) of knee [M17.9] 12/11/2013  . Osteoarthritis of left knee [M17.9] 12/05/2013  . Total knee replacement status [Z96.659] 12/05/2013   Subjective Data:  65 year old male with history of depression, presented himself after having a thought of overdosing Xanax in the setting of discordance with his male friend. He also reports recent diagnosis of prostate cancer three months ago. He has seen Ms. Sharmon Revere, therapist for four years for group/individual DBT. He reports patterns of escalation of emotion when he is distressed. He feels better today and denies SI.   Continued Clinical Symptoms:  Alcohol Use Disorder Identification Test Final Score (AUDIT): 0 The "Alcohol Use Disorders Identification Test", Guidelines for Use in Primary Care, Second Edition.  World Pharmacologist [facility]). Score between 0-7:  no or low risk or alcohol related problems. Score between 8-15:  moderate risk of alcohol related problems. Score  between 16-19:  high risk of alcohol related problems. Score 20 or above:  warrants further diagnostic evaluation for alcohol dependence and treatment.   CLINICAL FACTORS:   Depression:   Anhedonia   Musculoskeletal: Strength & Muscle Tone: within normal limits Gait & Station: normal Patient leans: N/A  Psychiatric Specialty Exam: Physical Exam  ROS  Blood pressure (!) 148/92, pulse 89, temperature 98.1 F (36.7 C), temperature source Oral, resp. rate 16, height 5\' 11"  (1.803 m), weight 187 lb (84.8 kg).Body mass index is 26.08 kg/m.  General Appearance: Casual and Neat  Eye Contact:  Good  Speech:  Normal Rate  Volume:  Normal  Mood:  better  Affect:  slightly constricted  Thought Process:  Coherent and Goal Directed  Orientation:  Full (Time, Place, and Person)  Thought Content:  Logical no paranoia, denies AH/VH  Suicidal Thoughts:  No  Homicidal Thoughts:  No  Memory:  reports impaired remote memory  Judgement:  Fair  Insight:  Fair  Psychomotor Activity:  Normal  Concentration:  Concentration: Fair and Attention Span: Fair  Recall:  [employer] of Knowledge:  Good  Language:  NA  Akathisia:  No  Handed:  Ambidextrous  AIMS (if indicated):     Assets:  Communication Skills Desire for Improvement  ADL's:  Intact  Cognition:  WNL  Sleep:   good      COGNITIVE FEATURES THAT CONTRIBUTE TO RISK:  None    SUICIDE RISK:   Mild:  Suicidal ideation of limited frequency, intensity, duration, and specificity.  There are no identifiable plans, no associated intent, mild dysphoria and related symptoms, good self-control (both objective and subjective assessment), few other risk factors, and identifiable protective factors, including  available and accessible social support.   PLAN OF CARE: Psychiatry admission, restart his home medication of Vilazodone and clonazepam. He understands the rationale of holding Adderall during the admission. He denies SI and contracts for  safety  I certify that inpatient services furnished can reasonably be expected to improve the patient's condition.  Norman Clay, MD 05/25/2016, 6:30 PM

## 2016-05-25 NOTE — BHH Group Notes (Addendum)

## 2016-05-25 NOTE — Progress Notes (Signed)
Initial Interdisciplinary Treatment Plan   PATIENT STRESSORS: Marital or family conflict Traumatic event   PATIENT STRENGTHS: Ability for insight Active sense of humor Average or above average intelligence Capable of independent living Communication skills Motivation for treatment/growth Physical Health   PROBLEM LIST: Problem List/Patient Goals Date to be addressed Date deferred Reason deferred Estimated date of resolution  "I am feeling better" 05/24/2016     "Learn to cope" 05/24/2016                                                 DISCHARGE CRITERIA:  Ability to meet basic life and health needs Adequate post-discharge living arrangements Improved stabilization in mood, thinking, and/or behavior Medical problems require only outpatient monitoring Motivation to continue treatment in a less acute level of care Need for constant or close observation no longer present Reduction of life-threatening or endangering symptoms to within safe limits Safe-care adequate arrangements made Verbal commitment to aftercare and medication compliance  PRELIMINARY DISCHARGE PLAN: Outpatient therapy  PATIENT/FAMIILY INVOLVEMENT: This treatment plan has been presented to and reviewed with the patient, Jonathan Forbes, an.  The patient and family have been given the opportunity to ask questions and make suggestions.  Randel Pigg 05/25/2016, 7:18 AM

## 2016-05-25 NOTE — Plan of Care (Signed)
Problem: Education: Goal: Ability to make informed decisions regarding treatment will improve Outcome: Progressing Nurse discussed suicidal thoughts/depression/coping skills with patient.

## 2016-05-25 NOTE — Progress Notes (Signed)
Recreation Therapy Notes  Animal-Assisted Activity (AAA) Program Checklist/Progress Notes Patient Eligibility Criteria Checklist & Daily Group note for Rec TxIntervention  Date: 08.01.2017 Time: 2:45pm Location: [address]    AAA/T Program Assumption of Risk Form signed by Patient/ or Parent Legal Guardian Yes  Patient is free of allergies or sever asthma Yes  Patient reports no fear of animals Yes  Patient reports no history of cruelty to animals Yes  Patient understands his/her participation is voluntary Yes  Behavioral Response: Did not attend.   Laureen Ochs Karla Pavone, LRT/CTRS   Lane Hacker 05/25/2016 3:58 PM
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
## 2016-05-25 NOTE — Progress Notes (Signed)
Patient would like SW/MD to talk to his counselor/therapist about his treatment.  Will discuss with MD tomorrow.

## 2016-05-25 NOTE — Progress Notes (Signed)
Adult Psychoeducational Group Note  Date:  05/25/2016 Time:  12:24 AM  Group Topic/Focus:  Wrap-Up Group:   The focus of this group is to help patients review their daily goal of treatment and discuss progress on daily workbooks.   Participation Level:  Active  Participation Quality:  Appropriate and Attentive  Affect:  Appropriate  Cognitive:  Alert and Appropriate  Insight: Appropriate  Engagement in Group:  Engaged  Modes of Intervention:  Activity, Discussion and Education  Additional Comments:  Pt stated that he had just been admitted several hours ago.  He was reluctant to share why he was admitted, but stated that he did not know why he was here.  Pt shared that he participated in [place] groups with his therapist.  This staff and the pt educated the group about what DBT consists of.  Pt was observed as being irritable and agitated at not having his medications at bedtime.  He also stated that he asked to sign a 72 hour document and was put off by his nurse.    This staff kept in communication with his nurse as to his state of mind.  Pt received sleep medication about midnight.  He had not fallen asleep at the 24:15 precaution check.    Pt verbalized gratitude for this staff's communication with nursing and himself.  Even though pt was irritable, he remained polite and cooperative. Versie Starks 05/25/2016, 12:24 AM

## 2016-05-25 NOTE — Progress Notes (Signed)
D:  Patient's self inventory sheet, patient has poor sleep, sleep medication helpful.  Fair appetite, normal energy level, good concentration.  Denied depression, anxiety, hopeless.  Denied SI.  Physical problems, pain knees, hips, feet, neck, back.  No pain medication.  Goal to understand system.  Seek knowledge.  Does have discharge plans. A:  Medications administered per MD orders.  Emotional support and encouragement given patient. R:  Denied SI and HI, contracts for safety.  Denied A/V hallucinations.  Safety maintained with 15 minute checks.

## 2016-05-25 NOTE — Progress Notes (Signed)
Patient stated that he did not need to be here and that he wished that he would have just stayed home. He is extremely anxious and stated that he had not taken medications since being here. Patient has anxious and depressed affect. Patient denies SI, HI, and AVH.  Patient remains safe on unit with q15 min checks. Education, support, and encouragement offered to patient.  Patient contracted verbally to safety.  Patient is receptive and cooperative, will continue to monitor

## 2016-05-26 DIAGNOSIS — F332 Major depressive disorder, recurrent severe without psychotic features: Secondary | ICD-10-CM

## 2016-05-26 MED ORDER — TRAZODONE HCL 50 MG PO TABS
50.0000 mg | ORAL_TABLET | Freq: Every evening | ORAL | Status: DC | PRN
  Administered 2016-05-26: 50 mg via ORAL
  Filled 2016-05-26: qty 1

## 2016-05-26 MED ORDER — POTASSIUM CHLORIDE CRYS ER 10 MEQ PO TBCR
10.0000 meq | EXTENDED_RELEASE_TABLET | Freq: Two times a day (BID) | ORAL | Status: DC
  Administered 2016-05-26 – 2016-05-27 (×2): 10 meq via ORAL
  Filled 2016-05-26 (×3): qty 1

## 2016-05-26 MED ORDER — CLONAZEPAM 1 MG PO TABS
1.0000 mg | ORAL_TABLET | Freq: Every day | ORAL | Status: DC
  Administered 2016-05-26: 1 mg via ORAL
  Filled 2016-05-26: qty 1

## 2016-05-26 MED ORDER — HYDROCODONE-ACETAMINOPHEN 10-325 MG PO TABS
1.0000 | ORAL_TABLET | Freq: Two times a day (BID) | ORAL | Status: DC | PRN
  Administered 2016-05-27: 1 via ORAL
  Filled 2016-05-26: qty 1
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
## 2016-05-26 NOTE — BHH Suicide Risk Assessment (Signed)
[place] INPATIENT:  Family/Significant Other Suicide Prevention Education  Suicide Prevention Education:  Patient Refusal for Family/Significant Other Suicide Prevention Education: The patient Jonathan Forbes has refused to provide written consent for family/significant other to be provided Family/Significant Other Suicide Prevention Education during admission and/or prior to discharge.  Physician notified. SPE reviewed with patient and brochure provided. Patient encouraged to return to hospital if having suicidal thoughts, patient verbalized his/her understanding and has no further questions at this time.   [name], Casimiro Needle 05/26/2016, 5:01 PM

## 2016-05-26 NOTE — Progress Notes (Signed)
Recreation Therapy Notes  Date: 05/26/16 Time: 0930 Location: 300 Hall Group Room  Group Topic: Stress Management  Goal Area(s) Addresses:  Patient will verbalize importance of using healthy stress management.  Patient will identify positive emotions associated with healthy stress management.   Intervention: Stress Management   Activity :  Progressive Muscle Relaxation.  LRT introduced the technique of progressive muscle relaxation to patients.  Patients were asked to follow along with LRT as a script was read to guide patients through the activity.  Education:  Stress Management, Discharge Planning.    Clinical Observations/Feedback: Pt did not attend group.   Victorino Sparrow, LRT/CTRS

## 2016-05-26 NOTE — BHH Group Notes (Signed)
Late Entry from 05/25/16:   [facility] LCSW Group Therapy  05/25/16 1:15 PM   Type of Therapy:  Group Therapy  Participation Level:  Active  Participation Quality:  Attentive, sharing  Affect:  Appropriate  Cognitive:  Alert and Oriented  Insight:  Developing/Improving and Engaged  Engagement in Therapy:  Developing/Improving and Engaged  Modes of Intervention:  Clarification, Confrontation, Discussion, Education, Exploration, Limit-setting, Orientation, Problem-solving, Rapport Building, Art therapist, Socialization and Support  Summary of Progress/Problems: The topic for group therapy was feelings about diagnosis.  Pt actively participated in group discussion on their past and current diagnosis and how they feel towards this.  Pt also identified how society and family members judge them, based on their diagnosis as well as stereotypes and stigmas.  Patient discussed his acceptance of his illness and his involvement in DBT groups. He shared that he is worried that his adult son may develop mental health issues as well. He discussed his perceived loss of control and freedom that comes along with being hospitalized. CSW validated patient's experiences and provided support.   Tilden Fossa, MSW, [place] Clinical Social Worker [facility] [phone]
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
## 2016-05-26 NOTE — BHH Counselor (Signed)
CSW spoke with patient's therapist Sharmon Revere [phone] at patient's request. CSW updated therapist on patient's progress and anticipated discharge date of 05/27/16. Follow up appt scheduled for 05/31/16.  Tilden Fossa, LCSW Clinical Social Worker [facility] [phone]
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
## 2016-05-26 NOTE — Progress Notes (Signed)
The focus of this group is to help patients review their daily goal of treatment and discuss progress on daily workbooks.  Patient attended group this evening and states that his goal was to figure out how the system works around here. Patient states that he didn't achieve his goal because no one knows anything or has to ask someone else.

## 2016-05-26 NOTE — Tx Team (Addendum)
Interdisciplinary Treatment Plan Update (Adult) Date: 05/26/2016    Time Reviewed: 9:30 AM  Progress in Treatment: Attending groups: Yes Participating in groups: Yes Taking medication as prescribed: Yes Tolerating medication: Yes Family/Significant other contact made: No, patient has declined collateral contact Patient understands diagnosis: Yes Discussing patient identified problems/goals with staff: Yes Medical problems stabilized or resolved: Yes Denies suicidal/homicidal ideation: Yes Issues/concerns per patient self-inventory: Yes Other:  New problem(s) identified: N/A  Discharge Plan or Barriers: Home to resume current outpatient services.  Reason for Continuation of Hospitalization:  Depression Anxiety Medication Stabilization   Comments: N/A  Estimated length of stay: 1-2 days   Patient is a 65 year old male who presented to the hospital with increased depression and SI with plan to overdose. Primary triggers for admission include relationship issues and memory problems. Patient will benefit from crisis stabilization, medication evaluation, group therapy and psycho education in addition to case management for discharge planning. At discharge, it is recommended that Pt remain compliant with established discharge plan and continued treatment.   Review of initial/current patient goals per problem list:  1. Goal(s): Patient will participate in aftercare plan   Met: Yes   Target date: 3-5 days post admission date   As evidenced by: Patient will participate within aftercare plan AEB aftercare provider and housing plan at discharge being identified.  8/2: Goal met. Patient plans to return home to follow up with outpatient services.     2. Goal (s): Patient will exhibit decreased depressive symptoms and suicidal ideations.   Met: Yes   Target date: 3-5 days post admission date   As evidenced by: Patient will utilize self rating of depression at 3 or below  and demonstrate decreased signs of depression or be deemed stable for discharge by MD.  8/1: Goal progressing. Patient reports little to no depression or SI.   8/3: Goal met. Patient reports improved symptoms, denies SI.    3. Goal(s): Patient will demonstrate decreased signs and symptoms of anxiety.   Met: Yes   Target date: 3-5 days post admission date   As evidenced by: Patient will utilize self rating of anxiety at 3 or below and demonstrated decreased signs of anxiety, or be deemed stable for discharge by MD  8/1: Goal progressing. Patient reports little to no anxiety.  8/3: Goal met. Patient reports improvement in his symptoms, reports feeling safe for discharge.   Attendees: Patient:    Family:    Physician: Dr. Parke Poisson; Dr. Shea Evans; Dr. Modesta Messing  05/26/2016 9:30 AM  Nursing: Elesa Massed, Otilio Carpen, RN 05/26/2016 9:30 AM  Clinical Social Worker: Erasmo Downer Shyheem Whitham, LCSW 05/26/2016 9:30 AM  Other: Peri Maris, LCSW; [facility], LCSW  05/26/2016 9:30 AM  Other:  05/26/2016 9:30 AM  Other: Lars Pinks, Case Manager 05/26/2016 9:30 AM  Other:  Lockie Mola, May Augustin, NP 05/26/2016 9:30 AM  Other:    Other:      Scribe for Treatment Team:  Tilden Fossa, [place]

## 2016-05-26 NOTE — Progress Notes (Signed)
D: Patient seen sitting on dayroom watching TV and interacting with peers. Cheerful and pleasant upon approach. Denies pain, SI, AH/VH at this time. Patient made no new complaint except that he stated he would like to see/talk to a therapist/social worker/counselor tomorrow. No behavioral issues noted.  A: Staff offered support and encouragement as needed. Offered bedtime meds as ordered. Every 15 minutes check for safety maintained. Will continue to monitor patient for safety and stability.  R: Patient remains safe and appropriate.

## 2016-05-26 NOTE — Clinical Social Work Note (Signed)
Sharmon Revere, patient's outpatient therapist, left V stating she wants call to discuss outpatient support - asked for CSW to call - [phone].  [place] states she has spoken w pt and has his code.  [phone]).  CSW [place] notified.   Edwyna Shell, LCSW Lead Clinical Social Worker Phone:  [phone]

## 2016-05-27 DIAGNOSIS — F332 Major depressive disorder, recurrent severe without psychotic features: Secondary | ICD-10-CM

## 2016-05-27 MED ORDER — AMLODIPINE BESYLATE 10 MG PO TABS: 10.0000 mg | ORAL_TABLET | Freq: Every day | ORAL | Status: AC

## 2016-05-27 MED ORDER — HYDROCODONE-ACETAMINOPHEN 10-325 MG PO TABS: 1.0000 | ORAL_TABLET | Freq: Three times a day (TID) | ORAL | 0 refills | Status: AC

## 2016-05-27 MED ORDER — TAMSULOSIN HCL 0.4 MG PO CAPS: 0.4000 mg | ORAL_CAPSULE | Freq: Every day | ORAL | 0 refills | Status: AC

## 2016-05-27 MED ORDER — CLONAZEPAM 1 MG PO TABS: 1.0000 mg | ORAL_TABLET | Freq: Every day | ORAL | 0 refills | Status: AC

## 2016-05-27 MED ORDER — VILAZODONE HCL 40 MG PO TABS: 40.0000 mg | ORAL_TABLET | Freq: Every day | ORAL | 0 refills | Status: AC

## 2016-05-27 MED ORDER — CYANOCOBALAMIN 500 MCG PO TABS: 500.0000 ug | ORAL_TABLET | Freq: Every day | ORAL | Status: AC

## 2016-05-27 MED ORDER — TRAZODONE HCL 50 MG PO TABS: 50.0000 mg | ORAL_TABLET | Freq: Every evening | ORAL | 0 refills | Status: AC | PRN

## 2016-05-27 MED ORDER — CLONIDINE HCL 0.1 MG PO TABS: 0.1000 mg | ORAL_TABLET | Freq: Every day | ORAL | 0 refills | Status: AC

## 2016-05-27 MED ORDER — ADULT MULTIVITAMIN W/MINERALS CH: 1.0000 | ORAL_TABLET | Freq: Every day | ORAL | Status: AC

## 2016-05-27 MED ORDER — OMEPRAZOLE 40 MG PO CPDR: 40.0000 mg | DELAYED_RELEASE_CAPSULE | Freq: Every day | ORAL | Status: AC
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
## 2016-05-27 NOTE — Progress Notes (Signed)
Discharge note: Pt received both written and verbal discharge instructions. Pt verbalized understanding of discharge instructions. Pt agreed to f/u appt and med regimen. Pt received prescriptions, AVS, SRA and Transition record. Pt given belongings from room and locker. Pt safely discharged to the lobby. [facility] security notified to transport pt to [facility] parking lot to pick up his car.

## 2016-05-27 NOTE — Progress Notes (Signed)
  [facility] Adult Case Management Discharge Plan :  Will you be returning to the same living situation after discharge:  Yes,  patient plans to return home At discharge, do you have transportation home?: Yes,  patient reports access to transportation Do you have the ability to pay for your medications: Yes,  patient will be provided with prescriptions at discharge  Release of information consent forms completed and in the chart;  Patient's signature needed at discharge.  Patient to Follow up at: Follow-up Information    [facility] Follow up on 05/31/2016.   Why:  Therapy appt with Sharmon Revere on Monday August 7th at 10am. Please call office if you need to reschedule.  Contact information: [place].  [address] [place] [address] [phone]       Patient would prefer to schedule his own appointment for medication management services. .           Next level of care provider has access to [place] and Suicide Prevention discussed: Yes,  with patient     Has patient been referred to the Quitline?: Patient refused referral  Patient has been referred for addiction treatment: Yes  Tene Gato, Casimiro Needle 05/27/2016, 10:20 AM

## 2016-07-06 ENCOUNTER — Other Ambulatory Visit: Payer: Self-pay | Admitting: Orthopaedic Surgery

## 2016-07-06 DIAGNOSIS — M25511 Pain in right shoulder: Secondary | ICD-10-CM

## 2016-07-10 ENCOUNTER — Ambulatory Visit
Admission: RE | Admit: 2016-07-10 | Discharge: 2016-07-10 | Disposition: A | Discharge status: Home / Self Care | Payer: Medicare Other | Source: Ambulatory Visit | Attending: Orthopaedic Surgery | Admitting: Orthopaedic Surgery

## 2016-07-10 DIAGNOSIS — M25511 Pain in right shoulder: Secondary | ICD-10-CM

## 2016-07-22 ENCOUNTER — Encounter (HOSPITAL_COMMUNITY): Payer: Self-pay | Admitting: Emergency Medicine

## 2016-07-22 DIAGNOSIS — Z96653 Presence of artificial knee joint, bilateral: Secondary | ICD-10-CM | POA: Insufficient documentation

## 2016-07-22 DIAGNOSIS — Z79899 Other long term (current) drug therapy: Secondary | ICD-10-CM | POA: Diagnosis not present

## 2016-07-22 DIAGNOSIS — I10 Essential (primary) hypertension: Secondary | ICD-10-CM | POA: Insufficient documentation

## 2016-07-22 DIAGNOSIS — Z85828 Personal history of other malignant neoplasm of skin: Secondary | ICD-10-CM | POA: Insufficient documentation

## 2016-07-22 DIAGNOSIS — R0789 Other chest pain: Secondary | ICD-10-CM | POA: Diagnosis not present

## 2016-07-22 DIAGNOSIS — Z8546 Personal history of malignant neoplasm of prostate: Secondary | ICD-10-CM | POA: Diagnosis not present

## 2016-07-22 LAB — I-STAT TROPONIN, ED: Troponin i, poc: 0.01 ng/mL (ref 0.00–0.08)

## 2016-07-22 NOTE — ED Triage Notes (Signed)
Pt. reports left chest pain radiating to upper back and right shoulder onset this evening with SOB , nausea and diaphoresis .

## 2016-07-23 ENCOUNTER — Emergency Department (HOSPITAL_COMMUNITY)
Admission: EM | Admit: 2016-07-23 | Discharge: 2016-07-23 | Disposition: A | Discharge status: Home / Self Care | Payer: Medicare Other | Attending: Emergency Medicine | Admitting: Emergency Medicine

## 2016-07-23 ENCOUNTER — Encounter (HOSPITAL_COMMUNITY): Payer: Self-pay | Admitting: *Deleted

## 2016-07-23 ENCOUNTER — Emergency Department (HOSPITAL_COMMUNITY): Payer: Medicare Other

## 2016-07-23 DIAGNOSIS — R0789 Other chest pain: Secondary | ICD-10-CM

## 2016-07-23 LAB — CBC
HEMATOCRIT: 43.8 % (ref 39.0–52.0)
HEMOGLOBIN: 14.7 g/dL (ref 13.0–17.0)
MCH: 32.2 pg (ref 26.0–34.0)
MCHC: 33.6 g/dL (ref 30.0–36.0)
MCV: 95.8 fL (ref 78.0–100.0)
Platelets: 189 10*3/uL (ref 150–400)
RBC: 4.57 MIL/uL (ref 4.22–5.81)
RDW: 13.6 % (ref 11.5–15.5)
WBC: 7.9 10*3/uL (ref 4.0–10.5)

## 2016-07-23 LAB — I-STAT TROPONIN, ED: Troponin i, poc: 0.01 ng/mL (ref 0.00–0.08)

## 2016-07-23 LAB — BASIC METABOLIC PANEL
ANION GAP: 6 (ref 5–15)
BUN: 9 mg/dL (ref 6–20)
CO2: 26 mmol/L (ref 22–32)
Calcium: 9.6 mg/dL (ref 8.9–10.3)
Chloride: 110 mmol/L (ref 101–111)
Creatinine, Ser: 0.65 mg/dL (ref 0.61–1.24)
GFR calc Af Amer: >60 mL/min (ref >60)
Glucose, Bld: 70 mg/dL (ref 65–99)
POTASSIUM: 4 mmol/L (ref 3.5–5.1)
SODIUM: 142 mmol/L (ref 135–145)

## 2016-07-23 MED ORDER — GI COCKTAIL ~~LOC~~
30.0000 mL | Freq: Once | ORAL | Status: AC
  Administered 2016-07-23: 30 mL via ORAL
  Filled 2016-07-23: qty 30

## 2016-07-23 NOTE — ED Provider Notes (Signed)
[facility] DEPT Provider Note   CSN: [id number] Arrival date & time: 07/22/16  2301  By signing my name below, I, Maud Deed. Royston Sinner, attest that this documentation has been prepared under the direction and in the presence of Blanchie Dessert, MD.  Electronically Signed: Maud Deed. Royston Sinner, ED Scribe. 07/23/16. 2:28 AM.    History   Chief Complaint Chief Complaint  Patient presents with  . Chest Pain   The history is provided by the patient. No language interpreter was used.    HPI Comments: Jonathan Forbes is a 65 y.o. male with a PMHx of HTN and prostate cancer who presents to the Emergency Department complaining of constant, unchanged L sided chest pain that radiates to the upper back and R shoulder onset earlier this evening. Pain is described as tightness. Pt also reports associates shortness of breath along with a HA which began shortly prior to my evaluation. No aggravating or alleviating factors at this time. No OTC medications or home remedies attempted prior to arrival. No recent fever or chills. Pt states he had an extremely stressful day today prior to onset of symptoms. He admits to consuming 2 cocktails this evening prior to onset of pain. Pt denies any history of similar symptoms.   PCP: Omer Jack, MD    Past Medical History:  Diagnosis Date  . Depression   . Eczema    Hx; of  . Glaucoma    Hx; of beginning stages  . Hypertension    off all BP meds after bariatric surgery  . Mitral valve regurgitation    Hx; of slight  . OA (osteoarthritis) of knee    "both" (05/16/2014)  . OSA (obstructive sleep apnea)    no CPAP  . Prostate cancer ([place])   . Skin cancer 08/2013   "back"    Patient Active Problem List   Diagnosis Date Noted  . Severe episode of recurrent major depressive disorder, without psychotic features ([place])   . MDD (major depressive disorder) ([place]) 05/24/2016  . 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  Osteoarthritis of right knee 05/15/2014  . OSA (obstructive sleep apnea) 04/22/2014  . HTN (hypertension) 12/14/2013  . Meralgia paraesthetica 12/14/2013  . Postoperative anemia due to acute blood loss 12/14/2013  . OA (osteoarthritis) of knee 12/11/2013  . Osteoarthritis of left knee 12/05/2013  . Total knee replacement status 12/05/2013    Past Surgical History:  Procedure Laterality Date  . BREATH TEK H PYLORI N/A 05/14/2014   Procedure: BREATH TEK H PYLORI;  Surgeon: Pedro Earls, MD;  Location: [facility];  Service: General;  Laterality: N/A;  . CATARACT EXTRACTION W/ INTRAOCULAR LENS  IMPLANT, BILATERAL Bilateral   . COLONOSCOPY     Hx; of  . HERNIA REPAIR    . JOINT REPLACEMENT Bilateral   . KNEE ARTHROSCOPY Left 08/27/2015   Procedure: LEFT KNEE ARTHROSCOPY WITH SYNOVECTOMY;  Surgeon: Leandrew Koyanagi, MD;  Location: [facility];  Service: Orthopedics;  Laterality: Left;  . KNEE ARTHROSCOPY Right 11/12/2015   Procedure: RIGHT KNEE ARTHROSCOPY WITH DEBRIDEMENT;  Surgeon: Leandrew Koyanagi, MD;  Location: [facility];  Service: Orthopedics;  Laterality: Right;  . LAPAROSCOPIC GASTRIC SLEEVE RESECTION WITH HIATAL HERNIA REPAIR  01/06/2015   Procedure: LAPAROSCOPIC GASTRIC SLEEVE RESECTION WITH HIATAL HERNIA REPAIR;  Surgeon: Johnathan Hausen, MD;  Location: [facility];  Service: General;;  . LITHOTRIPSY    . SKIN CANCER EXCISION  08/2013   "  off my back; not melanoma"  . TONSILLECTOMY  as child  . TOTAL KNEE ARTHROPLASTY Left 12/05/2013   Procedure: LEFT TOTAL KNEE ARTHROPLASTY;  Surgeon: Marianna Payment, MD;  Location: [facility];  Service: Orthopedics;  Laterality: Left;  . TOTAL KNEE ARTHROPLASTY Right 05/15/2014   Procedure: RIGHT TOTAL KNEE ARTHROPLASTY;  Surgeon: Marianna Payment, MD;  Location: [facility];  Service: Orthopedics;  Laterality: Right;  . TUMOR EXCISION     skin cancer of right side of back-wound vac  . UPPER GI ENDOSCOPY  01/06/2015   Procedure:  UPPER GI ENDOSCOPY;  Surgeon: Johnathan Hausen, MD;  Location: [facility];  Service: General;;       Home Medications    Prior to Admission medications   Medication Sig Start Date End Date Taking? Authorizing Provider  amLODipine (NORVASC) 10 MG tablet Take 1 tablet (10 mg total) by mouth daily. For high blood pressure 05/27/16   Encarnacion Slates, NP  clonazePAM (KLONOPIN) 1 MG tablet Take 1 tablet (1 mg total) by mouth at bedtime. For anxiety 05/27/16   Encarnacion Slates, NP  cloNIDine (CATAPRES) 0.1 MG tablet Take 1 tablet (0.1 mg total) by mouth daily. For high blood pressure 05/27/16   Encarnacion Slates, NP  cyanocobalamin 500 MCG tablet Take 1 tablet (500 mcg total) by mouth daily. For low Vitamin B-12 05/27/16   Encarnacion Slates, NP  HYDROcodone-acetaminophen (NORCO) 10-325 MG tablet Take 1 tablet by mouth 3 (three) times daily. For severe pain 05/27/16   Encarnacion Slates, NP  Multiple Vitamin (MULTIVITAMIN WITH MINERALS) TABS tablet Take 1 tablet by mouth daily. For low Vitamin 05/27/16   Encarnacion Slates, NP  omeprazole (PRILOSEC) 40 MG capsule Take 1 capsule (40 mg total) by mouth daily. For acid reflux 05/27/16   Encarnacion Slates, NP  tamsulosin (FLOMAX) 0.4 MG CAPS capsule Take 1 capsule (0.4 mg total) by mouth daily. For Prostate health 05/27/16   Encarnacion Slates, NP  traZODone (DESYREL) 50 MG tablet Take 1 tablet (50 mg total) by mouth at bedtime as needed for sleep. 05/27/16   Encarnacion Slates, NP  Vilazodone HCl (VIIBRYD) 40 MG TABS Take 1 tablet (40 mg total) by mouth daily. For depression 05/27/16   Encarnacion Slates, NP    Family History Family History  Problem Relation Age of Onset  . Heart disease Father   . Hypertension Father     Social History Social History  Substance Use Topics  . Smoking status: Never Smoker  . Smokeless tobacco: Never Used  . Alcohol use Yes     Allergies   Ciprofloxacin hcl   Review of Systems Review of Systems  Constitutional: Negative for chills and fever.  Respiratory: Positive for  shortness of breath.   Cardiovascular: Positive for chest pain.  Neurological: Positive for headaches.  All other systems reviewed and are negative.    Physical Exam Updated Vital Signs BP 150/95 (BP Location: Left Arm)   Pulse 60   Temp 98.2 F (36.8 C) (Oral)   Resp 18   Ht 6' (1.829 m)   Wt 190 lb (86.2 kg)   SpO2 97%   BMI 25.77 kg/m   Physical Exam  Constitutional: He is oriented to person, place, and time. He appears well-developed and well-nourished.  HENT:  Head: Normocephalic and atraumatic.  Eyes: EOM are normal.  Neck: Normal range of motion.  Cardiovascular: Normal rate, regular rhythm, normal heart sounds and intact distal pulses.  All pulses equal bilaterally to all extremities.   Pulmonary/Chest: Effort normal and breath sounds normal. No respiratory distress.  No reproducible chest pain.  Abdominal: Soft. He exhibits no distension. There is no tenderness.  Mld epigastric tenderness.  Musculoskeletal: Normal range of motion. He exhibits tenderness. He exhibits no edema.  No calf pain or swelling noted.  Neurological: He is alert and oriented to person, place, and time.  Skin: Skin is warm and dry.  Psychiatric: He has a normal mood and affect. Judgment normal.  Nursing note and vitals reviewed.    ED Treatments / Results   DIAGNOSTIC STUDIES: Oxygen Saturation is 97% on RA, adequate by my interpretation.    COORDINATION OF CARE: 2:25 AM- Will order blood work, CXR, and EKG. Will give GI cocktail. Discussed treatment plan with pt at bedside and pt agreed to plan.     Labs (all labs ordered are listed, but only abnormal results are displayed) Labs Reviewed  BASIC METABOLIC PANEL  CBC  I-STAT [place], ED  I-STAT [place], ED    EKG  EKG Interpretation  Date/Time:  Thursday July 22 2016 23:10:47 EDT Ventricular Rate:  67 PR Interval:  164 QRS Duration: 80 QT Interval:  392 QTC Calculation: 414 R Axis:   -18 Text Interpretation:   Sinus rhythm with Premature atrial complexes Possible Left atrial enlargement Septal infarct , age undetermined No significant change since last tracing Confirmed by [facility]  MD, Loree Fee ([id number]) on 07/23/2016 2:10:45 AM       Radiology Dg Chest 2 View  Result Date: 07/23/2016 CLINICAL DATA:  LEFT chest pain radiating to upper back and RIGHT shoulder beginning this evening with shortness of breath, nausea and diaphoresis. History of hypertension. EXAM: CHEST  2 VIEW COMPARISON:  Chest radiograph December 05, 2013 FINDINGS: Cardiomediastinal silhouette is normal. Mild calcific atherosclerosis aortic arch. No pleural effusions or focal consolidations. Similarly increased lung volumes. Trachea projects midline and there is no pneumothorax. Soft tissue planes and included osseous structures are non-suspicious. IMPRESSION: No acute cardiopulmonary process. Electronically Signed   By: Elon Alas M.D.   On: 07/23/2016 00:06    Procedures Procedures (including critical care time)  Medications Ordered in ED Medications - No data to display   Initial Impression / Assessment and Plan / ED Course  I have reviewed the triage vital signs and the nursing notes.  Pertinent labs & imaging results that were available during my care of the patient were reviewed by me and considered in my medical decision making (see chart for details).  Clinical Course   Patient is a 65 year old male presenting today with the complaint of atypical chest pain that started after an extremely stressful day. He states the pain went across his chest and into his right shoulder. He has also complaining of a headache and nausea. This started after he sat down to rest and started drinking a cocktail. He is not intoxicated and does not abuse alcohol. He denies any prior cardiac history. He is a nonsmoker but does have a history of hypertension.  HEART score 2 and symptoms not suggestive of PE, dissection but possible abdominal  pathology such as GERD or gastritis. All labs including culture troponin are negative. EKG without significant findings. Chest x-ray within normal limits. Patient improved after GI cocktail. At this time low suspicion for ACS.   Final Clinical Impressions(s) / ED Diagnoses   Final diagnoses:  Atypical chest pain    New Prescriptions New Prescriptions   No medications on  file   I personally performed the services described in this documentation, which was scribed in my presence.  The recorded information has been reviewed and considered.    Blanchie Dessert, MD 07/23/16 0430

## 2016-07-23 NOTE — ED Notes (Signed)
Pt reports being stressed today, having a bad day. States he was sitting drinking vodka and became nauseous. Right shoulder pain reported. Headache started upon arrival. Reports diaphoresis and SOB

## 2016-07-30 ENCOUNTER — Encounter (HOSPITAL_COMMUNITY): Payer: Self-pay

## 2016-08-12 ENCOUNTER — Ambulatory Visit (INDEPENDENT_AMBULATORY_CARE_PROVIDER_SITE_OTHER): Payer: Self-pay | Admitting: Orthopaedic Surgery

## 2016-08-17 ENCOUNTER — Ambulatory Visit (INDEPENDENT_AMBULATORY_CARE_PROVIDER_SITE_OTHER): Payer: Medicare Other | Admitting: Orthopaedic Surgery

## 2016-08-17 ENCOUNTER — Ambulatory Visit (INDEPENDENT_AMBULATORY_CARE_PROVIDER_SITE_OTHER): Payer: Medicare Other

## 2016-08-17 ENCOUNTER — Encounter (INDEPENDENT_AMBULATORY_CARE_PROVIDER_SITE_OTHER): Payer: Self-pay | Admitting: Orthopaedic Surgery

## 2016-08-17 DIAGNOSIS — M7061 Trochanteric bursitis, right hip: Secondary | ICD-10-CM | POA: Diagnosis not present

## 2016-08-17 DIAGNOSIS — M25551 Pain in right hip: Secondary | ICD-10-CM

## 2016-08-17 MED ORDER — BUPIVACAINE HCL 0.5 % IJ SOLN
3.0000 mL | INTRAMUSCULAR | Status: AC | PRN
  Administered 2016-08-17: 3 mL via INTRA_ARTICULAR

## 2016-08-17 MED ORDER — METHYLPREDNISOLONE ACETATE 40 MG/ML IJ SUSP
40.0000 mg | INTRAMUSCULAR | Status: AC | PRN
  Administered 2016-08-17: 40 mg via INTRA_ARTICULAR

## 2016-08-17 MED ORDER — LIDOCAINE HCL 1 % IJ SOLN
3.0000 mL | INTRAMUSCULAR | Status: AC | PRN
  Administered 2016-08-17: 3 mL

## 2016-08-17 NOTE — Progress Notes (Signed)
Office Visit Note   Patient: Jonathan Forbes           Date of Birth: [birth year]           MRN: [id number] Visit Date: 08/17/2016              Requested by: Omer Jack, MD [place], [place] [address] PCP: Omer Jack, MD   Assessment & Plan: Visit Diagnoses:  1. Pain in right hip   2. Trochanteric bursitis, right hip     Plan:  - right hip troch inj given today - recommend HEP, stretching - f/u prn  Follow-Up Instructions: Return if symptoms worsen or fail to improve.   Orders:  Orders Placed This Encounter  Procedures  . Large Joint Injection/Arthrocentesis  . XR HIP UNILAT W OR W/O PELVIS 2-3 VIEWS RIGHT   No orders of the defined types were placed in this encounter.     Procedures: Large Joint Inj Date/Time: 08/17/2016 10:49 AM Performed by: Leandrew Koyanagi Authorized by: Leandrew Koyanagi   Consent Given by:  Patient Timeout: prior to procedure the correct patient, procedure, and site was verified   Indications:  Pain Location:  Hip Site:  R greater trochanter Prep: patient was prepped and draped in usual sterile fashion   Needle Size:  22 G Approach:  Lateral Ultrasound Guidance: No   Fluoroscopic Guidance: No  no Medications:  3 mL lidocaine 1 %; 3 mL bupivacaine 0.5 %; 40 mg methylPREDNISolone acetate 40 MG/ML     Clinical Data: No additional findings.   Subjective: Chief Complaint  Patient presents with  . Right Shoulder - Pain, Follow-up  . Right Hip - Pain    Pain for 2 months now    New problem of 2 mo h/o right lateral hip pain.  Denies mechanical sxs.  Pain is worse with exercises.  Worse with palpation.  Denies groin pain or radiation, back pain.  Right shoulder is doing better.      Review of Systems  Constitutional: Negative.   Skin: Negative.   All other systems reviewed and are negative.    Objective: Vital Signs: There were no vitals taken for this visit.  Physical Exam GENERAL: patient is  healthy appearing, appearing stated age and in no acute distress. HEENT: head is symmetric, atraumatic and normocephalic; pupils are round and equally reactive to light and accomodation. Patient has healthy appearing dentition. Moist mucous membranes. SKIN: warm and well perfused with no evidence of open wounds, rashes, or signs of infection. CARDIOVASCULAR: chest normal appearing, heart is regular rate and rhythm, with no perceived murmurs. Good pulses are present distally with extremities warm and well perfused. RESPIRATORY: Patient is breathing with normal effort. No use of accessory muscles and no appearance of difficulties with respiration. Lungs are clear to auscultation bilaterally, with no evidence of wheezing or crackles. ABDOMEN: soft, nontender, nondistended, with no guarding or rebound, and no perceived masses. Normal bowel sounds. GI: deferred NEURO: Patient with good muscle strength and tone. Deep tendon reflexes intact. Sensation intact. PSYCH: patient is alert and oriented to person, place and time with normal mood and affect.  Right Hip Exam   Tenderness  The patient is experiencing tenderness in the greater trochanter.  Range of Motion  The patient has normal right hip ROM.  Muscle Strength  The patient has normal right hip strength.  Other  Sensation: normal Pulse: present      Specialty Comments:  No  specialty comments available.  Imaging: Xr Hip Unilat W Or W/o Pelvis 2-3 Views Right  Result Date: 08/17/2016 Mild bilateral hip DJD.  No acute findings.    PMFS History: Patient Active Problem List   Diagnosis Date Noted  . Trochanteric bursitis, right hip 08/17/2016  . Pain in right hip 08/17/2016  . Severe episode of recurrent major depressive disorder, without psychotic features ([place])   . MDD (major depressive disorder) 05/24/2016  . 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 Osteoarthritis of right knee  05/15/2014  . OSA (obstructive sleep apnea) 04/22/2014  . HTN (hypertension) 12/14/2013  . Meralgia paraesthetica 12/14/2013  . Postoperative anemia due to acute blood loss 12/14/2013  . OA (osteoarthritis) of knee 12/11/2013  . Osteoarthritis of left knee 12/05/2013  . Total knee replacement status 12/05/2013   Past Medical History:  Diagnosis Date  . Depression   . Eczema    Hx; of  . Glaucoma    Hx; of beginning stages  . Hypertension    off all BP meds after bariatric surgery  . Mitral valve regurgitation    Hx; of slight  . OA (osteoarthritis) of knee    "both" (05/16/2014)  . OSA (obstructive sleep apnea)    no CPAP  . Prostate cancer ([place])   . Skin cancer 08/2013   "back"    Family History  Problem Relation Age of Onset  . Heart disease Father   . Hypertension Father     Past Surgical History:  Procedure Laterality Date  . BREATH TEK H PYLORI N/A 05/14/2014   Procedure: BREATH TEK H PYLORI;  Surgeon: Pedro Earls, MD;  Location: [facility];  Service: General;  Laterality: N/A;  . CATARACT EXTRACTION W/ INTRAOCULAR LENS  IMPLANT, BILATERAL Bilateral   . COLONOSCOPY     Hx; of  . HERNIA REPAIR    . JOINT REPLACEMENT Bilateral   . KNEE ARTHROSCOPY Left 08/27/2015   Procedure: LEFT KNEE ARTHROSCOPY WITH SYNOVECTOMY;  Surgeon: Leandrew Koyanagi, MD;  Location: [facility];  Service: Orthopedics;  Laterality: Left;  . KNEE ARTHROSCOPY Right 11/12/2015   Procedure: RIGHT KNEE ARTHROSCOPY WITH DEBRIDEMENT;  Surgeon: Leandrew Koyanagi, MD;  Location: [facility];  Service: Orthopedics;  Laterality: Right;  . LAPAROSCOPIC GASTRIC SLEEVE RESECTION WITH HIATAL HERNIA REPAIR  01/06/2015   Procedure: LAPAROSCOPIC GASTRIC SLEEVE RESECTION WITH HIATAL HERNIA REPAIR;  Surgeon: Johnathan Hausen, MD;  Location[facility];  Service: General;;  . LITHOTRIPSY    . SKIN CANCER EXCISION  08/2013   "off my back; not melanoma"  . TONSILLECTOMY  as child  . TOTAL KNEE  ARTHROPLASTY Left 12/05/2013   Procedure: LEFT TOTAL KNEE ARTHROPLASTY;  Surgeon: Marianna Payment, MD;  Location: [facility];  Service: Orthopedics;  Laterality: Left;  . TOTAL KNEE ARTHROPLASTY Right 05/15/2014   Procedure: RIGHT TOTAL KNEE ARTHROPLASTY;  Surgeon: Marianna Payment, MD;  Location: [facility];  Service: Orthopedics;  Laterality: Right;  . TUMOR EXCISION     skin cancer of right side of back-wound vac  . UPPER GI ENDOSCOPY  01/06/2015   Procedure: UPPER GI ENDOSCOPY;  Surgeon: Johnathan Hausen, MD;  Location: [facility];  Service: General;;   Social History   Occupational History  . Not on file.   Social History Main Topics  . Smoking status: Never Smoker  . Smokeless tobacco: Never Used  . Alcohol use Yes  . Drug use: No  .  Sexual activity: Not Currently

## 2016-08-17 NOTE — Patient Instructions (Signed)

## 2019-10-26 DEATH — deceased
